# Patient Record
Sex: Female | Born: 1935 | Race: White | Hispanic: No | Smoking: Former smoker
Health system: Southern US, Community
[De-identification: ages and names within clinical notes are randomized; demographics above are authoritative.]

## PROBLEM LIST (undated history)

## (undated) DIAGNOSIS — N183 Chronic kidney disease, stage 3 unspecified: Secondary | ICD-10-CM

## (undated) DIAGNOSIS — I2510 Atherosclerotic heart disease of native coronary artery without angina pectoris: Secondary | ICD-10-CM

## (undated) DIAGNOSIS — E1122 Type 2 diabetes mellitus with diabetic chronic kidney disease: Secondary | ICD-10-CM

## (undated) DIAGNOSIS — I5030 Unspecified diastolic (congestive) heart failure: Secondary | ICD-10-CM

## (undated) DIAGNOSIS — J449 Chronic obstructive pulmonary disease, unspecified: Secondary | ICD-10-CM

## (undated) DIAGNOSIS — E669 Obesity, unspecified: Secondary | ICD-10-CM

## (undated) DIAGNOSIS — F419 Anxiety disorder, unspecified: Secondary | ICD-10-CM

## (undated) DIAGNOSIS — M1990 Unspecified osteoarthritis, unspecified site: Secondary | ICD-10-CM

## (undated) DIAGNOSIS — E119 Type 2 diabetes mellitus without complications: Secondary | ICD-10-CM

## (undated) DIAGNOSIS — C189 Malignant neoplasm of colon, unspecified: Secondary | ICD-10-CM

## (undated) DIAGNOSIS — D649 Anemia, unspecified: Secondary | ICD-10-CM

## (undated) DIAGNOSIS — J40 Bronchitis, not specified as acute or chronic: Secondary | ICD-10-CM

## (undated) HISTORY — PX: CATARACT EXTRACTION, BILATERAL: SHX1313

## (undated) HISTORY — PX: ABDOMINAL HYSTERECTOMY: SUR658

## (undated) HISTORY — PX: PARTIAL COLECTOMY: SHX5273

---

## 2004-06-08 ENCOUNTER — Other Ambulatory Visit: Payer: Self-pay

## 2004-06-08 ENCOUNTER — Inpatient Hospital Stay: Payer: Self-pay | Admitting: Internal Medicine

## 2004-06-24 ENCOUNTER — Ambulatory Visit: Payer: Self-pay | Admitting: Internal Medicine

## 2004-07-25 ENCOUNTER — Ambulatory Visit: Payer: Self-pay | Admitting: Internal Medicine

## 2004-08-25 ENCOUNTER — Ambulatory Visit: Payer: Self-pay | Admitting: Internal Medicine

## 2004-09-22 ENCOUNTER — Ambulatory Visit: Payer: Self-pay | Admitting: Internal Medicine

## 2006-10-27 ENCOUNTER — Ambulatory Visit: Payer: Self-pay | Admitting: Rheumatology

## 2006-11-01 ENCOUNTER — Inpatient Hospital Stay: Payer: Self-pay | Admitting: Internal Medicine

## 2006-11-01 ENCOUNTER — Ambulatory Visit: Payer: Self-pay | Admitting: Family Medicine

## 2006-11-01 ENCOUNTER — Other Ambulatory Visit: Payer: Self-pay

## 2006-11-02 ENCOUNTER — Other Ambulatory Visit: Payer: Self-pay

## 2007-09-11 ENCOUNTER — Ambulatory Visit: Payer: Self-pay | Admitting: Internal Medicine

## 2011-05-01 ENCOUNTER — Inpatient Hospital Stay: Payer: Self-pay | Admitting: Internal Medicine

## 2011-05-06 LAB — PATHOLOGY REPORT

## 2011-06-19 ENCOUNTER — Inpatient Hospital Stay: Payer: Self-pay | Admitting: Internal Medicine

## 2012-08-21 ENCOUNTER — Inpatient Hospital Stay: Payer: Self-pay | Admitting: Psychiatry

## 2012-08-21 LAB — COMPREHENSIVE METABOLIC PANEL
Albumin: 3 g/dL — ABNORMAL LOW (ref 3.4–5.0)
Bilirubin,Total: 0.4 mg/dL (ref 0.2–1.0)
Co2: 33 mmol/L — ABNORMAL HIGH (ref 21–32)
Creatinine: 1.31 mg/dL — ABNORMAL HIGH (ref 0.60–1.30)
EGFR (African American): 44 — ABNORMAL LOW
EGFR (Non-African Amer.): 38 — ABNORMAL LOW
Osmolality: 280 (ref 275–301)
Potassium: 4.5 mmol/L (ref 3.5–5.1)
Sodium: 138 mmol/L (ref 136–145)

## 2012-08-21 LAB — CBC
HCT: 47.1 % — ABNORMAL HIGH (ref 35.0–47.0)
MCV: 99 fL (ref 80–100)
RBC: 4.75 10*6/uL (ref 3.80–5.20)
RDW: 15 % — ABNORMAL HIGH (ref 11.5–14.5)

## 2012-08-21 LAB — TROPONIN I: Troponin-I: <0.02 ng/mL

## 2012-08-21 LAB — CK TOTAL AND CKMB (NOT AT ARMC): CK-MB: 1.4 ng/mL (ref 0.5–3.6)

## 2012-08-21 LAB — PROTIME-INR: Prothrombin Time: 13 secs (ref 11.5–14.7)

## 2012-08-21 LAB — PRO B NATRIURETIC PEPTIDE: B-Type Natriuretic Peptide: 450 pg/mL (ref 0–450)

## 2012-08-21 LAB — HEMOGLOBIN A1C: Hemoglobin A1C: 7.1 % — ABNORMAL HIGH (ref 4.2–6.3)

## 2012-08-22 LAB — BASIC METABOLIC PANEL
Anion Gap: 9 (ref 7–16)
Calcium, Total: 9.8 mg/dL (ref 8.5–10.1)
Co2: 28 mmol/L (ref 21–32)
Glucose: 171 mg/dL — ABNORMAL HIGH (ref 65–99)
Sodium: 133 mmol/L — ABNORMAL LOW (ref 136–145)

## 2012-08-22 LAB — CBC WITH DIFFERENTIAL/PLATELET
Basophil #: 0.1 10*3/uL (ref 0.0–0.1)
Eosinophil #: 0 10*3/uL (ref 0.0–0.7)
HGB: 13.8 g/dL (ref 12.0–16.0)
Lymphocyte #: 1 10*3/uL (ref 1.0–3.6)
Lymphocyte %: 6.9 %
MCH: 31.9 pg (ref 26.0–34.0)
MCHC: 32.3 g/dL (ref 32.0–36.0)
Monocyte #: 0.1 x10 3/mm — ABNORMAL LOW (ref 0.2–0.9)

## 2012-08-23 LAB — URINALYSIS, COMPLETE
Bacteria: NONE SEEN
Bilirubin,UR: NEGATIVE
Glucose,UR: 50 mg/dL (ref 0–75)
Ketone: NEGATIVE
Leukocyte Esterase: NEGATIVE
Nitrite: NEGATIVE
Ph: 6 (ref 4.5–8.0)
Specific Gravity: 1.011 (ref 1.003–1.030)
Squamous Epithelial: <1
WBC UR: NONE SEEN /HPF (ref 0–5)

## 2012-08-24 LAB — BASIC METABOLIC PANEL
BUN: 33 mg/dL — ABNORMAL HIGH (ref 7–18)
Chloride: 92 mmol/L — ABNORMAL LOW (ref 98–107)
Creatinine: 1.14 mg/dL (ref 0.60–1.30)
EGFR (African American): 52 — ABNORMAL LOW
EGFR (Non-African Amer.): 45 — ABNORMAL LOW
Osmolality: 277 (ref 275–301)
Potassium: 5.1 mmol/L (ref 3.5–5.1)
Sodium: 130 mmol/L — ABNORMAL LOW (ref 136–145)

## 2012-08-24 LAB — VANCOMYCIN, TROUGH: Vancomycin, Trough: 7 ug/mL — ABNORMAL LOW (ref 10–20)

## 2012-08-24 LAB — MAGNESIUM: Magnesium: 2.1 mg/dL

## 2012-08-24 LAB — URINE CULTURE

## 2012-08-25 LAB — CBC WITH DIFFERENTIAL/PLATELET
Basophil #: 0.1 10*3/uL (ref 0.0–0.1)
Eosinophil #: 0 10*3/uL (ref 0.0–0.7)
Eosinophil %: 0 %
HCT: 49.2 % — ABNORMAL HIGH (ref 35.0–47.0)
Lymphocyte #: 1.3 10*3/uL (ref 1.0–3.6)
MCH: 32 pg (ref 26.0–34.0)
Monocyte %: 4.2 %
Neutrophil #: 9.9 10*3/uL — ABNORMAL HIGH (ref 1.4–6.5)
Platelet: 207 10*3/uL (ref 150–440)
RBC: 4.96 10*6/uL (ref 3.80–5.20)
RDW: 15 % — ABNORMAL HIGH (ref 11.5–14.5)
WBC: 11.7 10*3/uL — ABNORMAL HIGH (ref 3.6–11.0)

## 2012-08-25 LAB — BASIC METABOLIC PANEL
Co2: 34 mmol/L — ABNORMAL HIGH (ref 21–32)
EGFR (African American): 46 — ABNORMAL LOW
Glucose: 163 mg/dL — ABNORMAL HIGH (ref 65–99)
Sodium: 134 mmol/L — ABNORMAL LOW (ref 136–145)

## 2012-08-26 LAB — BASIC METABOLIC PANEL
BUN: 28 mg/dL — ABNORMAL HIGH (ref 7–18)
Calcium, Total: 8.9 mg/dL (ref 8.5–10.1)
Chloride: 98 mmol/L (ref 98–107)
Creatinine: 0.93 mg/dL (ref 0.60–1.30)
EGFR (African American): >60
Glucose: 115 mg/dL — ABNORMAL HIGH (ref 65–99)
Potassium: 4.1 mmol/L (ref 3.5–5.1)
Sodium: 135 mmol/L — ABNORMAL LOW (ref 136–145)

## 2012-08-26 LAB — CBC WITH DIFFERENTIAL/PLATELET
HCT: 45.8 % (ref 35.0–47.0)
HGB: 14.8 g/dL (ref 12.0–16.0)
Lymphocyte %: 19.1 %
MCHC: 32.4 g/dL (ref 32.0–36.0)
MCV: 98 fL (ref 80–100)
Monocyte #: 1 x10 3/mm — ABNORMAL HIGH (ref 0.2–0.9)
Platelet: 183 10*3/uL (ref 150–440)
RBC: 4.66 10*6/uL (ref 3.80–5.20)

## 2012-08-27 LAB — CULTURE, BLOOD (SINGLE)

## 2012-08-27 LAB — BASIC METABOLIC PANEL
BUN: 31 mg/dL — ABNORMAL HIGH (ref 7–18)
Calcium, Total: 9.3 mg/dL (ref 8.5–10.1)
Co2: 30 mmol/L (ref 21–32)
EGFR (African American): 49 — ABNORMAL LOW
EGFR (Non-African Amer.): 42 — ABNORMAL LOW
Osmolality: 278 (ref 275–301)

## 2012-08-29 LAB — BASIC METABOLIC PANEL
Anion Gap: 6 — ABNORMAL LOW (ref 7–16)
BUN: 30 mg/dL — ABNORMAL HIGH (ref 7–18)
Calcium, Total: 9.5 mg/dL (ref 8.5–10.1)
Chloride: 95 mmol/L — ABNORMAL LOW (ref 98–107)
Co2: 33 mmol/L — ABNORMAL HIGH (ref 21–32)
Creatinine: 0.99 mg/dL (ref 0.60–1.30)
EGFR (African American): >60
Glucose: 90 mg/dL (ref 65–99)
Potassium: 4.1 mmol/L (ref 3.5–5.1)
Sodium: 134 mmol/L — ABNORMAL LOW (ref 136–145)

## 2012-09-04 ENCOUNTER — Inpatient Hospital Stay: Payer: Self-pay | Admitting: Internal Medicine

## 2012-09-04 LAB — COMPREHENSIVE METABOLIC PANEL
Albumin: 3.1 g/dL — ABNORMAL LOW (ref 3.4–5.0)
Anion Gap: 6 — ABNORMAL LOW (ref 7–16)
BUN: 21 mg/dL — ABNORMAL HIGH (ref 7–18)
Bilirubin,Total: 0.5 mg/dL (ref 0.2–1.0)
Creatinine: 1.33 mg/dL — ABNORMAL HIGH (ref 0.60–1.30)
Glucose: 159 mg/dL — ABNORMAL HIGH (ref 65–99)
Osmolality: 276 (ref 275–301)
Potassium: 4.6 mmol/L (ref 3.5–5.1)
SGPT (ALT): 41 U/L (ref 12–78)
Total Protein: 6.8 g/dL (ref 6.4–8.2)

## 2012-09-04 LAB — URINALYSIS, COMPLETE
Bilirubin,UR: NEGATIVE
Blood: NEGATIVE
Ketone: NEGATIVE
Leukocyte Esterase: NEGATIVE
Nitrite: NEGATIVE
Protein: 30
Squamous Epithelial: <1

## 2012-09-04 LAB — TROPONIN I: Troponin-I: <0.02 ng/mL

## 2012-09-04 LAB — CBC
HCT: 49.1 % — ABNORMAL HIGH (ref 35.0–47.0)
HGB: 16 g/dL (ref 12.0–16.0)
MCH: 32.3 pg (ref 26.0–34.0)
MCHC: 32.5 g/dL (ref 32.0–36.0)
MCV: 99 fL (ref 80–100)
Platelet: 196 10*3/uL (ref 150–440)
RBC: 4.94 10*6/uL (ref 3.80–5.20)

## 2012-09-04 LAB — CK TOTAL AND CKMB (NOT AT ARMC)
CK, Total: 42 U/L (ref 21–215)
CK-MB: 0.5 ng/mL (ref 0.5–3.6)

## 2012-09-05 LAB — COMPREHENSIVE METABOLIC PANEL
Albumin: 2.5 g/dL — ABNORMAL LOW (ref 3.4–5.0)
Alkaline Phosphatase: 58 U/L (ref 50–136)
Anion Gap: 7 (ref 7–16)
Calcium, Total: 8.8 mg/dL (ref 8.5–10.1)
Co2: 26 mmol/L (ref 21–32)
Creatinine: 1.15 mg/dL (ref 0.60–1.30)
Glucose: 165 mg/dL — ABNORMAL HIGH (ref 65–99)
Osmolality: 277 (ref 275–301)
Potassium: 4.5 mmol/L (ref 3.5–5.1)
SGPT (ALT): 30 U/L (ref 12–78)

## 2012-09-05 LAB — CBC WITH DIFFERENTIAL/PLATELET
Basophil #: 0 10*3/uL (ref 0.0–0.1)
Basophil %: 0.2 %
Eosinophil %: 0 %
HCT: 43.5 % (ref 35.0–47.0)
HGB: 14.3 g/dL (ref 12.0–16.0)
MCHC: 32.9 g/dL (ref 32.0–36.0)
MCV: 99 fL (ref 80–100)
Monocyte #: 0.3 x10 3/mm (ref 0.2–0.9)
Platelet: 167 10*3/uL (ref 150–440)
RDW: 15 % — ABNORMAL HIGH (ref 11.5–14.5)

## 2012-09-06 LAB — TROPONIN I
Troponin-I: <0.02 ng/mL
Troponin-I: <0.02 ng/mL
Troponin-I: <0.02 ng/mL

## 2012-09-07 LAB — BASIC METABOLIC PANEL
Anion Gap: 6 — ABNORMAL LOW (ref 7–16)
BUN: 14 mg/dL (ref 7–18)
Chloride: 103 mmol/L (ref 98–107)
Co2: 28 mmol/L (ref 21–32)
Creatinine: 1.26 mg/dL (ref 0.60–1.30)
EGFR (Non-African Amer.): 40 — ABNORMAL LOW
Glucose: 131 mg/dL — ABNORMAL HIGH (ref 65–99)
Potassium: 4.4 mmol/L (ref 3.5–5.1)
Sodium: 137 mmol/L (ref 136–145)

## 2012-09-07 LAB — CBC WITH DIFFERENTIAL/PLATELET
Basophil %: 0.6 %
Eosinophil #: 0.2 10*3/uL (ref 0.0–0.7)
Eosinophil #: 0.2 10*3/uL (ref 0.0–0.7)
Eosinophil %: 2.3 %
HCT: 44.3 % (ref 35.0–47.0)
HCT: 45.5 % (ref 35.0–47.0)
HGB: 14.9 g/dL (ref 12.0–16.0)
Lymphocyte #: 1.9 10*3/uL (ref 1.0–3.6)
Lymphocyte %: 23.5 %
MCH: 32.3 pg (ref 26.0–34.0)
MCHC: 32.8 g/dL (ref 32.0–36.0)
MCV: 100 fL (ref 80–100)
Monocyte #: 0.6 x10 3/mm (ref 0.2–0.9)
Monocyte %: 7.7 %
Monocyte %: 7.1 %
Neutrophil #: 5.7 10*3/uL (ref 1.4–6.5)
Neutrophil %: 66 %
Platelet: 154 10*3/uL (ref 150–440)
RBC: 4.45 10*6/uL (ref 3.80–5.20)
RDW: 15 % — ABNORMAL HIGH (ref 11.5–14.5)
RDW: 14.7 % — ABNORMAL HIGH (ref 11.5–14.5)
WBC: 8.9 10*3/uL (ref 3.6–11.0)
WBC: 8.4 10*3/uL (ref 3.6–11.0)

## 2012-09-07 LAB — HEPATIC FUNCTION PANEL A (ARMC)
Albumin: 2.6 g/dL — ABNORMAL LOW (ref 3.4–5.0)
Alkaline Phosphatase: 61 U/L (ref 50–136)
Bilirubin, Direct: <0.1 mg/dL (ref 0.00–0.20)
Total Protein: 5.6 g/dL — ABNORMAL LOW (ref 6.4–8.2)

## 2012-09-07 LAB — PROTIME-INR
INR: 0.9
Prothrombin Time: 12.2 secs (ref 11.5–14.7)

## 2012-09-07 LAB — APTT: Activated PTT: 25.2 secs (ref 23.6–35.9)

## 2012-09-07 LAB — URINE CULTURE

## 2012-09-08 LAB — TROPONIN I: Troponin-I: <0.02 ng/mL

## 2012-09-09 LAB — HEPATIC FUNCTION PANEL A (ARMC)
Bilirubin, Direct: <0.1 mg/dL (ref 0.00–0.20)
Bilirubin,Total: 0.4 mg/dL (ref 0.2–1.0)
SGOT(AST): 22 U/L (ref 15–37)
SGPT (ALT): 34 U/L (ref 12–78)
Total Protein: 5.5 g/dL — ABNORMAL LOW (ref 6.4–8.2)

## 2012-09-09 LAB — TROPONIN I: Troponin-I: <0.02 ng/mL

## 2012-09-09 LAB — CK-MB: CK-MB: <0.5 ng/mL — ABNORMAL LOW (ref 0.5–3.6)

## 2012-09-09 LAB — LIPASE, BLOOD: Lipase: 367 U/L (ref 73–393)

## 2012-09-10 LAB — CULTURE, BLOOD (SINGLE)

## 2012-09-11 LAB — BASIC METABOLIC PANEL
Anion Gap: 3 — ABNORMAL LOW (ref 7–16)
BUN: 21 mg/dL — ABNORMAL HIGH (ref 7–18)
Co2: 32 mmol/L (ref 21–32)
Creatinine: 1.23 mg/dL (ref 0.60–1.30)
Glucose: 168 mg/dL — ABNORMAL HIGH (ref 65–99)
Osmolality: 271 (ref 275–301)
Potassium: 5.2 mmol/L — ABNORMAL HIGH (ref 3.5–5.1)

## 2012-09-11 LAB — CBC
HGB: 14.1 g/dL (ref 12.0–16.0)
MCH: 32.1 pg (ref 26.0–34.0)
MCHC: 32.7 g/dL (ref 32.0–36.0)
MCV: 98 fL (ref 80–100)
RBC: 4.41 10*6/uL (ref 3.80–5.20)
RDW: 14.6 % — ABNORMAL HIGH (ref 11.5–14.5)
WBC: 11 10*3/uL (ref 3.6–11.0)

## 2012-09-11 LAB — HEPATIC FUNCTION PANEL A (ARMC)
Albumin: 2.8 g/dL — ABNORMAL LOW (ref 3.4–5.0)
Alkaline Phosphatase: 57 U/L (ref 50–136)
Bilirubin, Direct: <0.1 mg/dL (ref 0.00–0.20)
SGOT(AST): 28 U/L (ref 15–37)
SGPT (ALT): 55 U/L (ref 12–78)

## 2012-09-11 LAB — LIPASE, BLOOD: Lipase: 283 U/L (ref 73–393)

## 2012-09-12 LAB — CBC WITH DIFFERENTIAL/PLATELET
Basophil #: 0 10*3/uL (ref 0.0–0.1)
Basophil %: 0.1 %
Eosinophil #: 0 10*3/uL (ref 0.0–0.7)
Lymphocyte #: 0.7 10*3/uL — ABNORMAL LOW (ref 1.0–3.6)
MCHC: 33.4 g/dL (ref 32.0–36.0)
MCV: 98 fL (ref 80–100)
Monocyte %: 3.2 %
Neutrophil #: 8.8 10*3/uL — ABNORMAL HIGH (ref 1.4–6.5)
Neutrophil %: 89.3 %
RBC: 4.05 10*6/uL (ref 3.80–5.20)
WBC: 9.9 10*3/uL (ref 3.6–11.0)

## 2012-09-13 LAB — BASIC METABOLIC PANEL
Anion Gap: 9 (ref 7–16)
Calcium, Total: 9.7 mg/dL (ref 8.5–10.1)
Chloride: 93 mmol/L — ABNORMAL LOW (ref 98–107)
Creatinine: 1.2 mg/dL (ref 0.60–1.30)

## 2012-09-13 LAB — HEMATOCRIT: HCT: 43.2 % (ref 35.0–47.0)

## 2012-09-13 LAB — HEMOGLOBIN: HGB: 13.5 g/dL (ref 12.0–16.0)

## 2014-04-08 ENCOUNTER — Emergency Department: Payer: Self-pay | Admitting: Emergency Medicine

## 2014-11-14 NOTE — Consult Note (Signed)
CC: abd pain.  Pt with abd pain across upper abd and in substernal area.  Pt had cath today with signif PVD.  She had an [place] of abd showing fatty liver with hepatomegaly.  This likely explains her discomfort across her upper abdomen.  Agree with taking PPI after discharge from hospital. LFT's ok except albumin at 2.8.  Electronic Signatures: Manya Silvas (MD)  (Signed on 18-Feb-14 17:20)  Authored  Last Updated: 18-Feb-14 17:20 by Manya Silvas (MD)

## 2014-11-14 NOTE — Consult Note (Signed)
PATIENT NAME:  Tina [name], Tina [name] MR#:  [id number] DATE OF BIRTH:  December 26, 1927  DATE OF CONSULTATION:  09/09/2012  REFERRING PHYSICIAN:   CONSULTING PHYSICIAN:  Dionisio David, MD  HISTORY OF PRESENT ILLNESS: This is an 81 year old white female with a past medical history of COPD, coronary artery disease, hypertension, hyperlipidemia, recently diagnosed diabetes, who came into the hospital with pneumonia and was treated with antibiotic Levaquin. She was about to be discharged, but she complained of chest pain, described as heaviness in the chest, and thus I was asked to evaluate the patient. She still feels tightness in the chest.   PAST MEDICAL HISTORY:  Coronary artery disease, history of MI in 2000 status post stents, hypertension, hypothyroidism, hyperlipidemia, peptic ulcer disease, chronic anemia, depression, insulin-dependent diabetes.   ALLERGIES: PENICILLIN, BIAXIN, CODEINE.   SOCIAL HISTORY: She quit smoking several years ago.  She denies EtOH abuse.   FAMILY HISTORY: Positive for leukemia and cancer, but not coronary artery disease.   PHYSICAL EXAMINATION: GENERAL: She is alert, oriented x3, in no acute distress right now.  VITALS:  Stable.  NECK: No JVD.  LUNGS: Clear.  HEART: Regular rate and rhythm. Normal S1, S2. No audible murmur.  ABDOMEN: Soft, nontender, positive bowel sounds.  EXTREMITIES: No pedal edema.  NEUROLOGIC: She appears to be intact.   LABORATORY, DIAGNOSTIC AND RADIOLOGICAL DATA: EKG shows normal sinus rhythm, right bundle branch block, nonspecific ST-T changes.   Her initial troponin is negative. Her white count is 8.4, hemoglobin is 14.9.   ASSESSMENT AND PLAN: Anginal-type chest pain. Chest x-ray actually just had chronic obstructive pulmonary disease, no infiltrate. Question is if all these symptoms are due to coronary artery disease; thus, I was asked to evaluate the patient. I think it is reasonable to discharge the patient, and we will follow the patient  in the office. The patient can be discharged tomorrow on Imdur 30 mg once a day along with aspirin and metoprolol, and we will do outpatient stress testing.   Thank you very much for the referral.      ____________________________ Dionisio David, MD sak:cc D: 09/09/2012 17:37:52 ET T: 09/09/2012 17:45:15 ET JOB#: [id number]  cc: Dionisio David, MD, <Dictator> Dionisio David MD ELECTRONICALLY SIGNED 09/10/2012 9:04

## 2014-11-14 NOTE — Discharge Summary (Signed)
PATIENT NAME:  Tina [name], Tina [name] MR#:  [id number] DATE OF BIRTH:  1928-01-19  DATE OF ADMISSION:  09/04/2012 DATE OF DISCHARGE:  09/13/2012   ADMITTING DIAGNOSES:  1.  Shortness of breath, possibly due to chronic obstructive pulmonary disease flare as well as bronchitis. Her chest x-ray failed to reveal pneumonia.  2.  Leukocytosis on presentation, could have been related to possible steroids.  3.  Possible systemic inflammatory response syndrome, although there was no evidence of pneumonia on chest x-ray. Her cultures have been negative.  4.  Right upper quadrant bilateral chest pressure, status post evaluation with a cardiac catheterization. Had a CT scan of the abdomen as well as right upper quadrant ultrasound. Etiology of this was unclear pt was tried on stool softners, simithicone. Was seen by cardiology and GI. Her D-dimer was negative. The patient's family refused CT scan of the chest due to her having severe allergy to contrast.  5.  Chronic kidney disease stage III.  6.  Chronic obstructive pulmonary disease.  7.  Morbid obesity.  8.  Sleep apnea.  9.  Hypertension.  10.  Hypothyroidism.  11.  Diabetes.  12.  Coronary artery disease, status post cardiac catheterization during this hospitalization.  13.  Hyponatremia felt to be due to some dehydration.  14.  Left-sided iliac stenosis.   CONSULTANTS DURING HOSPITALIZATION: Dr. Vira Agar, GI. Dr. Neoma Laming of cardiology.  PERTINENT LABORATORIES AND EVALUATIONS: Admitting glucose 159, BUN 21, creatinine 1.33, sodium 135, potassium 4.6, chloride 101, CO2 of 28, calcium 9.3. Lipase level was 382. LFTs: Total protein 6.8, albumin 3.1, bilirubin total 0.5, alkaline phosphatase 74, ALT 41. Troponin less than 0.02. WBC initially was 18.5, hemoglobin 16, platelet count 196. Subsequent WBC count on February 14 was 11.4 and then February 13 was normal at 9.6. Blood cultures showed coag-negative staph consistent with contamination with skin flora.  Second blood culture was negative. Urinalysis: Nitrites negative, leukocyte esterase negative. EKG showed sinus rhythm with PACs. Her cardiac catheterization showed global left ventricular function was normal, EF 60%. She had 90% stenosis at the bifurcation with the left iliac in the distal aorta. Left main was normal. Left circumflex 30% restenosis. LAD was nondominant. RCA: No significant disease. LVEF was 60%. Most recent blood work on February 20: Her BUN is 33, creatinine 1.20, sodium 131, potassium 5.0, chloride 93. Hemoglobin 13.5. D-dimer was 0.28. PA and lateral chest x-ray on presentation showed stable chest with stable cardiomegaly, no evidence of infiltrate. Ultrasound of the lower extremities showed no evidence of DVT. V/Q scan showed matched ventilation perfusion defect in the upper and lower left lobe of the lung, normal appearing right lung, cardiomegaly. Given the large number of defects, the patient is intermediate risk for PE. However, they were matched defects. CT of the abdomen and pelvis without contrast showed innumerable phleboliths, nonobstructing ureteral stone. Ultrasound of the right upper quadrant showed hepatomegaly with fatty liver, no other abnormalities.   HOSPITAL COURSE: Please refer to H and P done by the admitting physician. The patient is an 81 year old white female who presented with complaint of worsening symptoms of pneumonia. The patient was recently hospitalized and in the hospital had to be placed on BiPAP due to significant respiratory distress. Her white blood cell count was 14,000 during the hospitalization. The patient had finished her antibiotics prior to coming back to the hospital. The patient just came in with complaint of not feeling well. In the ED, she was noted to have an elevated WBC count; however,  she was on recent steroids. She had a temperature of 99.6. Her chest x-ray was negative. Due to her symptoms, we were asked to admit the patient.   The patient  continued to complain of dyspnea. Her chest x-ray was negative. She was thought to have possible COPD exacerbation and was treated with nebulizers and IV steroids. She continued to complain of shortness of breath; therefore, a V/Q scan was ordered which was intermediate due to matched defects in the left lung. We discussed the possibility of a CT scan of the chest; however, family did not want her to get that since the patient had a severe allergic anaphylactic reaction to the contrast. Her Doppler of the lower extremity was negative and her D-dimer was normal, making likelihood of a pulmonary embolus being very low. Also, the patient has had a history of bleeding in the past and the family did not want her to get anticoagulation unless absolutely needed. In terms of her respiratory status, the patient is requiring Lasix intermediately, but today she is weaned down to 2 liters.   She also started complaining of pain below her breast, sometimes pointing to her chest, sometimes pointing to her upper abdomen. Therefore, she had a CT scan of the abdomen which was nonrevealing. Also, a cardiology consult was obtained. Her cardiac enzymes and EKG remained negative. Due to her persistent symptoms of pressure below her breast in her chest, she had a cardiac catheterization done which was negative for any obstructive disease. She also had a right upper quadrant ultrasound which did not show any gallbladder disease. She continues to complain of these symptoms, which are very vague. She has been tried on stool softeners as well as gas relieving medications, and she has been thoroughly worked up for this.   At this point, the patient has generalized weakness and deconditioning. Family has requested that she be transferred to a rehab, which is currently being arranged.   DISCHARGE MEDICATIONS: Advair 250/50 one inhalation b.i.d., Artificial Tears 1 drop to each eye b.i.d., levothyroxine 112 mcg daily, lisinopril 5 daily,  metoprolol tartrate 25 mg 1 tab p.o. b.i.d, omeprazole 40 mg 2 times per day, milk of magnesia 30 mL once a day at bedtime,  ProAir 90 mcg 1 puff 4 times per day as needed for shortness of breath, Iophen-NR 10 mL  orally q.6 hours as needed for cough, aspirin 81 mg 1 tab p.o. daily, docusate 100 mg 1 tab p.o. b.i.d., Minerin cream apply topically to both lower extremities and feet once a day, Novolin R sliding scale (please refer to the discharge summary), MiraLax 17 grams daily, senna 1 tab p.o. b.i.d., vitamin B12 at 1000 mcg daily, vitamin D2 at 50,000 international units on Tuesday, loperamide 2 mg p.r.n. diarrhea, albuterol/Atrovent nebulizers q.6 p.r.n., Maalox 30 mL q.4 p.r.n. for indigestion/heartburn, Tylenol 500 q.4 p.r.n., Lantus 14 units subcutaneous at bedtime, guaifenesin 600 one tab p.o. b.i.d, sucralfate 1 gram p.o. 4 times a day, simethicone 80 mg 1 tab p.o. q.8, Percocet 5/325 q.6 p.r.n. for pain, oxygen delivery at 2 liters nasal cannula.  DIET: Low fat, low sodium, carbohydrate consistent diet.   ACTIVITY: As tolerated with PT, OT evaluation and treatment.   FOLLOWUP: With primary MD, which is [facility], so they need to see her at the skilled nursing facility. Follow up with Dr. Delana Meyer for her left-sided iliac stenosis in 4 to 6 week.   TIME SPENT: 45 minutes.    ____________________________ Lafonda Mosses Posey Pronto, MD shp:jm  D: 09/13/2012 14:48:30 ET T: 09/13/2012 15:18:43 ET JOB#: [id number]  cc: Myalynn Lingle H. Posey Pronto, MD, <Dictator> Alric Seton MD ELECTRONICALLY SIGNED 09/15/2012 12:12

## 2014-11-14 NOTE — Consult Note (Signed)
PATIENT NAME:  Tina [name], Tina [name] MR#:  [id number] DATE OF BIRTH:  1928/06/27  DATE OF CONSULTATION:  09/10/2012  REFERRING PHYSICIAN:  Dr. Posey Pronto. CONSULTING PHYSICIAN:  Manya Silvas, MD/Chany Woolworth A. Jerelene Redden, ANP  REASON FOR CONSULTATION: Abdominal pain.   HISTORY OF PRESENT ILLNESS: This is an 81 year old patient with history of COPD, pneumonia, diabetes, CAD, hyperkalemia, dehydration, who was hospitalized 08/21/2012 through 08/29/2012 for COPD exacerbation, treated with antibiotics, steroid therapy, BiPAP. She did well and was discharged to her assisted living facility. The patient was readmitted 09/04/2012 with worsening symptoms of pneumonia. She has been hospitalized this week and has undergone therapy for COPD, renal insufficiency, hyponatremia, improved, leukocytosis, improved with Levaquin, chest pain with negative cardiac enzymes, V/Q scan indeterminant, d-dimer negative, lower extremity Doppler negative, CT of the abdomen negative, and cardiology consult with recommendation for outpatient stress test. The patient complained today of a sensation that that there is an elephant sitting on her chest, and points to the lower midsternal epigastric area. She says this has been a continuous feeling today and it could be related to gas. GI has been asked to see the patient regarding further evaluation and management.   This patient has a history of IDA and colon cancer treated by surgery in 2006. She has had a recent colonoscopy 05/04/2011 for rectal bleeding, iron deficiency anemia, and it was notable for diverticulosis in the sigmoid and descending colon. Exam otherwise normal. An upper endoscopy performed 05/04/2011 for dyspepsia, iron deficiency anemia, performed by Dr. Verdie Shire, and it was totally normal. The patient has been on omeprazole twice daily continuous, and she denies NSAID use. She reports her diet and appetite have been normal, and her bowel habits have been normal. She has noted no nausea or  vomiting, heartburn, reflux or dysphagia. The patient has received antibiotic therapy over the last 2 months. She does have some baseline shortness of breath that is chronic, says she is not coughing much now. She has noted no exertional chest pain, but she says she is very sedentary and spends most of her day in bed. The patient reports her respiratory status has improved overall.   PAST MEDICAL HISTORY:  1. Coronary artery disease.  2. MI post stents.  3. Hypertension.  4. Hypothyroidism.  5. Hyperlipidemia.  6. Peptic ulcer disease.  7. Chronic anemia.  8. Depression.  9. Colon cancer, partial colon resection in 2006.  10. COPD.  11. Insulin-dependent diabetes.  12. Chronic kidney disease, stage III.  13. Sleep apnea.  14. Osteoarthritis.  15. Morbid obesity.   PAST SURGICAL HISTORY: Thyroidectomy, hysterectomy, cataract surgery, colon resection, thyroid surgery, parathyroid surgery.   MEDICATIONS:  1. Vitamin D 50,000 units daily.  2. B12 1000 mcg daily.  3. Senna for constipation. 4. ProAir HFA 90 mcg 4 times daily as needed.   5. MiraLax once daily.  6. Oyster calcium twice daily.  7. Omeprazole 20 mg twice daily. 8. Novolin insulin sliding scale.  9. Milk of Magnesia p.r.n.  10. Metoprolol 25 mg twice daily.  11. Acetaminophen 500 every 4 hours.  12. Maalox as needed for indigestion.  13. Loperamide as needed for diarrhea.  14. Lisinopril 5 mg daily.  15. Levothyroxine 112 mcg daily.  16. Lantus 5 units every night.  17. Iophen 10 mL every 6 hours.  18. Aspirin 81 mg daily.  19. Artificial tears in her eyes.  20. Advair Diskus 250/50 twice daily.   ALLERGIES: PENICILLIN, BIAXIN, CODEINE, NOVOCAIN, IODINE.   HABITS:  Previous tobacco, quit 30 years ago, negative alcohol. The patient stays at [facility], which is assisted living facility.   FAMILY HISTORY: Multiple myeloma, leukemia in mom, positive cancers on the mother's side.   REVIEW OF SYSTEMS: As noted  in history of present illness. Denies burping or belching, excessive flatus or diarrhea. Reports she is tolerating a regular diet well. Remaining 10 systems negative with the exception as noted in history of present illness.   PHYSICAL EXAMINATION:  VITAL SIGNS: Temperature 97.6, pulse 74, respiratory rate 18, blood pressure 109/57.  GENERAL: Elderly Caucasian female resting in bed, NAD. The patient is obese.  HEENT: Shows thinning hair. Conjunctivae a little injected. Sclerae anicteric. Oral mucosa is moist and intact.  NECK: Supple. Trachea is midline.  HEART TONES: S1 and S2, without murmur.  LUNGS: Essentially CTA with just a few scattered bibasilar crackles.  ABDOMEN: Soft, a little tender in epigastric area. Cannot appreciate hepatosplenomegaly. Bowel sounds are present.  RECTAL: Deferred.  SKIN: Warm and dry without rash.  EXTREMITIES: Without cyanosis, clubbing or edema.  MUSCULOSKELETAL: No significant joint abnormalities.  PSYCHIATRIC: Affect and mood within normal. She is alert and oriented.   LABORATORY: Glucoses have been checked daily. She was 230 today. Her most recent liver panel was 09/09/2012 with albumin 2.7, total protein 5.5, otherwise normal. CBC performed 09/07/2012 with a hemoglobin 14.9, WBC 8.4. Laboratory studies 09/09/2012 with lipase 367, albumin 2.7. Liver panel otherwise normal.   RADIOLOGY: CT of the abdomen and pelvis without contrast 09/07/2012 showed cardiomegaly. Liver normal. Gallbladder, spleen pancreas, adrenals normal. There is bilateral nephrolithiasis. Mild ectasia of the abdominal aorta 2.4 cm. No bowel obstruction or inflammatory changes noted. There is diverticulosis. Innumerable  phleboliths. Nonobstructing ureteral stones on either side cannot be excluded. Chest PA and lateral dated 09/07/2012 with lungs hyperinflated, consistent with COPD, cardiomegaly. V/Q scan performed 09/07/2012 showed matched ventilation perfusion defects in the upper and lower  left lobes of the lung, normal-appearing right lung. Given large number of defects, this patient is indeterminant risk for pulmonary embolism.   IMPRESSION: An 81 year old with a significant history of pulmonary disease and coronary artery disease, prior history of colon cancer in 2006, who was readmitted for worsening symptoms of pneumonia. She has noted today a sensation that she describes as an "elephant sitting on her chest." She points to the lower sternum and upper abdomen region. She reports a little tenderness with exam of epigastric area. Her cardiologist has seen her and notes a negative  D-dimer, negative cardiac enzymes, no acute EKG changes, and plans for an outpatient stress test. Regarding her pulmonary status, chest x-ray did not show pneumonia, blood cultures 1 out of 4, likely contamination. She has been treated with antibiotic therapy. Has had problems with hypoxia and dyspnea on exertion. V/Q with intermediate findings. She is afebrile. From a gastrointestinal standpoint, she denies heartburn, reflux, nausea or vomiting, tolerating her diet well, and reports normal bowel movements. She has had illnesses and is certainly at risk for gastritis, possibility of small ulcer could not be ignored. However, this patient is not a luminal candidate until after her cardiac stress test is completed and we have cardiac clearance. In the face of normal EGD and colonoscopy procedures in 2012, would not recommend urgent EGD at this time.   PLAN:  1. Treat patient with Carafate 1 gram b.i.d.  2. Mylanta as needed for gas relief.  3. Abdominal ultrasound. 4. No recommendation for EGD until she has cardiac workup completed.  5. CT study was unremarkable.  6. Her complaints of "elephant sitting on my chest" are certainly concerning from a cardiac description standpoint.   This case was discussed with Dr. Vira Agar in collaboration of care. Thank you for the consultation. These services provided by Denice Paradise, ANP.     ____________________________ Janalyn Harder. Jerelene Redden, ANP kam:OSi D: 09/10/2012 18:11:24 ET T: 09/11/2012 06:30:25 ET JOB#: [id number]  cc: Joelene Millin A. Jerelene Redden, ANP, <Dictator> Janalyn Harder. Sherlyn Hay, MSN, ANP-BC Adult Nurse Practitioner ELECTRONICALLY SIGNED 09/13/2012 8:50

## 2014-11-14 NOTE — H&P (Signed)
PATIENT NAME:  Tina [name], [name] MR#:  [id number] DATE OF BIRTH:  [birth year]  DATE OF ADMISSION:  09/04/2012  REFERRING PHYSICIAN:  Graciella Freer.   REASON FOR ADMISSION: Worsening symptoms of pneumonia. The patient recently discharged on February 5th.   PRIMARY CARE PHYSICIAN:  Unknown.  HISTORY OF PRESENT ILLNESS:   The patient is a very nice 81 year old female, who has history of being hospitalized recently here in the end of January, discharged on February 5th. She has history of COPD, CAD, hypertension, hyperlipidemia. She came on January 28th with a history of chronic pulmonary disease exacerbation, likely pneumonia. She was treated with Levaquin, inhalers and steroids, and she was starting to improve. At that moment, she was placed on BiPAP due to significant respiratory distress on admission. She had a white count of 14,000, and during the hospitalization, her white count remained around 11,000, despite the steroids. She has completed her steroids, and her white count today is 18,000. She is febrile and she is having significant respiratory distress requiring oxygen. For that reason, she is readmitted. Please refer to her previous H and P done on January 28th for other details. At this moment, the patient just does not feel very good. She went home on Monday, and today she comes with increased fever, increased shortness of breath, increased cough and increased secretions. The patient is admitted to the hospital service.   REVIEW OF SYSTEMS:   CONSTITUTIONAL: She denies any fatigue, weakness. Positive fever. Negative weight loss, weight gain.  EYES: Negative blurry vision or difficulty with her vision in general. EAR, NOSE, THROAT:  No tinnitus. No difficulty swallowing.  RESPIRATORY: Positive cough. Positive dyspnea. Negative hemoptysis. Negative painful respirations. Positive occasional wheezing, but she states that she has not been wheezing during the last couple of days. History of multiple  pneumonias in the past.  CARDIOVASCULAR: Negative chest pain, orthopnea, arrhythmias. GASTROINTESTINAL: No nausea or vomiting. No diarrhea. No abdominal pain. No melena or hematochezia.  GENITOURINARY: No dysuria, hematuria or changes in frequency.  ENDOCRINE: No polyuria, polydipsia or polyphagia. No cold or heat intolerance. HEMATOLOGIC/LYMPHATIC: No easy bleeding, anemia or easy bruising. No swollen glands.  SKIN: No rashes. No lesions.  MUSCULOSKELETAL: No swelling in joints. No significant arthritis. No gout.  NEUROLOGIC: No numbness, tingling. No ataxia or history of strokes.  PSYCHIATRIC: No anxiety, depression or insomnia.   PAST MEDICAL HISTORY: 1.  Coronary artery disease.  2.  History of MI in 2000, status post stents.  3.  Hypertension.  4.  Hypothyroidism.  5.  Hyperlipidemia.  6.  Peptic ulcer disease.  7.  Chronic anemia.  8.  Depression.  9.  Colon cancer status post partial colon resection.  10.  COPD.  11.  Insulin-dependent diabetes.  12.  Chronic kidney disease, stage III.  13.  Sleep apnea.  14.  Osteoarthritis.  15.  Obesity.   ALLERGIES: PENICILLIN, BIAXIN, CODEINE, NOVOCAINE AND IODINE. HER PENICILLIN ALLERGY WAS NUMBING OF THE FACE AND MOUTH.   PAST SURGICAL HISTORY:   1.  Positive for stent placement in 2000, and partial colon resection.  2.  She also had hysterectomy.  3.  Bladder surgery.  4.  Thyroidectomy.   SOCIAL HISTORY: The patient used to smoke. She smoked for over 40 years, 1 pack a day, but she quit about 30 years ago. No alcohol, no illicit drugs. She is staying at [facility], which is an assisted living facility.   FAMILY HISTORY: Positive leukemia in her mom. Positive cancers  that she cannot tell me exactly what they are, but they are on her mother's family.   CURRENT MEDICATIONS: Include vitamin D 50,000 units once daily, vitamin B12, 1000 mg once daily; senna for constipation, ProAir HFA 90 mcg 4 times a day as needed, MiraLAX once  daily, oyster calcium twice daily, omeprazole 20 mg twice daily, Novolin insulin sliding scale, milk of magnesia p.r.n., metoprolol 25 mg twice daily, acetaminophen 500 mg every 4 hours, Maalox as needed for indigestion, loperamide as needed for diarrhea, lisinopril 5 mg once daily, levothyroxine 112 mcg once daily, Lantus 5 units every night, Iophen 10 mL every 6 hours, aspirin 81 mg once daily, Artificial Tears in her eyes, and Advair Diskus 250/5 twice daily.   PHYSICAL EXAMINATION: VITAL SIGNS: Blood pressure 140/60, pulse 94, respiratory rate 20, temperature 99.6, oxygen saturation down to 88% on room air. Right now on 2 liters of oxygen, she is around 92%.  GENERAL: The patient is alert, oriented x 3. No acute distress. No respiratory distress. She is looking slightly dehydrated, but hemodynamically stable.  HEENT: Her pupils are equal and reactive. Extraocular movements are intact. Mucosae are slightly dry. No oral lesions. No oropharyngeal exudates. No thrush.  NECK: Supple. No JVD. No thyromegaly. No adenopathy. No carotid bruits. No rigidity.  CARDIOVASCULAR: Regular rate and rhythm. No murmurs, rubs or gallops. No displacement of PMI. No tenderness to palpation of anterior chest wall.  LUNGS: Clear without any wheezing or crepitus for the most part on the upper respiratory fields, but on a left base I can hear some rales. I could not hear any crackles. There is no dullness to percussion. No use of accessory muscles.  ABDOMEN: Soft, nontender, nondistended. No hepatosplenomegaly. No masses. Bowel sounds are positive.  EXTREMITIES: No cyanosis, no clubbing.   No edema. LYMPHATIC: Negative for lymphadenopathy.  SKIN: Without any rashes or petechiae.  MUSCULOSKELETAL: No significant joint abnormalities or effusions.  PSYCHIATRIC: Mood is normal without any signs of anxiety or agitation.  NEUROLOGIC: Cranial nerves II through XII intact. No focal findings.   LABORATORY, DIAGNOSTIC AND  RADIOLOGICAL DATA: Chest x-ray dictated by radiologist as no major changes from previous. I see a left blunting of the diaphragm, likely due to forming infiltrate, and there are no signs of CHF that I can tell. Her blood work shows a BUN of 21, creatinine of 1.33. At discharge, her creatinine was 0.99. Her sodium is 135, potassium is 4.6. Her albumin is 3.1. White cells are 18,000, at discharge last time were 12,000. Her platelets are 196, and her hemoglobin is 16. UA shows 2 white blood cells, the rest is negative.   ASSESSMENT AND PLAN: An 81 year old female with history of chronic obstructive, recently admission for chronic obstructive pulmonary disease exacerbation. Today, she comes with worsening of cough, now she has a fever, tachycardic and systemic inflammatory response syndrome. She is requiring at least 2 liters of oxygen. She is not on oxygen at home.  1.  Chronic obstructive pulmonary disease exacerbation with possible pneumonia. The patient is admitted at this moment. She was treated with Levaquin. She has been recently hospitalized, and she looks worse today. I am going to add on treatment for hospital-acquired pneumonia. She is going to get meropenem, AS THE PATIENT IS ALLERGIC TO PENICILLIN, and Levaquin. The patient was treated with Levaquin previously for a while. If she is not getting better, consider changing her antibiotics.   Plus/minus vancomycin at this moment. She does not look that septic, but  considering that she was recently hospitalized, and she had a gram-positive cocci on her blood cultures before, I am going to cover it with that. Her gram-positive cocci on previous blood cultures ended up being coagulase negative.   I am not going to give her steroids at this time, as the patient is not wheezing. Consider adding on steroids if her respiratory problems are not improving.   Blood cultures have been taken, and sputum cultures have been taken.   2.  Pneumonia. As mentioned  above, the patient on Levaquin, meropenem and vancomycin.  3.  Leukocytosis, likely due to pneumonia. She was recently on steroids. She is no longer taking them. Her white count and high dose of steroids did not go too high, it was so only 15,000. It dropped, and now is high again, likely due systemic inflammatory response syndrome.  4.  Systemic inflammatory response syndrome. Evidence of tachycardia, tachypnea, respiratory failure requiring 2 liters of oxygen with O2 sats only 88%. The patient has elevated white blood count and fever.  5.  Chronic kidney disease, stage III. Actually, her creatinine was better than it is now at this moment. We are going to give her gentle hydration to prevent this from getting worse.  6.  Hypertension. We are going to hold on lisinopril, as the patient had increase in her creatinine from her baseline.  7.  Hypothyroidism, status post thyroidectomy. Continue Synthroid.  8.  Diabetes. The patient has stable diabetes. We are going to continue Lantus.  9.  Coronary artery disease. Continue aspirin. Continue beta blocker. Hold on ACE inhibitor due to the fact that the patient has increased creatinine.  10.  PATIENT IS A FULL CODE.  11.  Deep vein thrombosis prophylaxis with heparin.   Time Spent with this patient today about 55 minutes.     ____________________________ Steele Sink, MD rsg:dm D: 09/04/2012 18:21:00 ET T: 09/04/2012 19:42:57 ET JOB#: [id number]  cc: Hiddenite Sink, MD, <Dictator> Takumi Din America Brown MD ELECTRONICALLY SIGNED 09/15/2012 22:46

## 2014-11-14 NOTE — H&P (Signed)
PATIENT NAME:  Tina [name], Tina [name] MR#:  [id number] DATE OF BIRTH:  [birth year]  DATE OF ADMISSION:  08/21/2012  PRIMARY CARE PHYSICIAN: Unknown.   CHIEF COMPLAINT: Shortness of breath.   HISTORY OF PRESENT ILLNESS: This is an 81 year old female who presents to the Emergency Room from assisted living at [facility] secondary to shortness of breath, cough and productive sputum. The patient says she developed those symptoms about 2 to 3 days ago and has been progressively getting worse. She says that the coughing paroxysm have kept her up at night. She also complains of chills, but no documented fever.  She generally is not very active and, therefore, gets in and out of wheelchair, but she has been having even baseline dyspnea without any exertion. She was therefore brought to the ER for further evaluation. In the Emergency Room, the patient was noted to be hypoxic with O2 sats in the mid-80s. She was noted to be in hypercarbic hypoxic respiratory failure secondary to COPD and therefore emergently placed on BiPAP. Hospital services were contacted for further treatment and evaluation.   REVIEW OF SYSTEMS:  CONSTITUTIONAL:  No documented fever. No weight gain.  No weight loss.  EYES:  No blurry or double vision.  ENT:  No tinnitus. No postnasal drip. No redness of the oropharynx.  RESPIRATORY:  Positive cough. No wheeze. No hemoptysis. Positive dyspnea.  CARDIOVASCULAR:  No chest pain. No orthopnea. No palpitations or syncope.  GASTROINTESTINAL:  No nausea. No vomiting. No diarrhea. No abdominal pain. No melena or hematochezia.  GENITOURINARY:  No dysuria or hematuria.  ENDOCRINE:  No polyuria or nocturia. No heat or cold intolerance.  HEMATOLOGY:  No anemia. No bruising. No bleeding.  INTEGUMENT:  No rashes. No lesions.  MUSCULOSKELETAL:  No arthritis. No swelling. No gout.  NEUROLOGIC:  No numbness or tingling. No ataxia. No seizure-type activity.  PSYCHIATRIC:  No anxiety. No insomnia. No ADD.    PAST MEDICAL HISTORY: Consistent with a history of coronary artery disease, status post MI, a stent in 2000. History of hypertension, hyperlipidemia, hypothyroidism, peptic ulcer disease, depression, chronic anemia, history of colon cancer, status post partial resection with chemotherapy, COPD. Diabetes. Chronic renal insufficiency, stage III. Obesity, sleep apnea, osteoarthritis.   ALLERGIES:  IODINE CONTRAST WHICH CAUSES SWELLING AND RASH. PENICILLIN. BIAXIN. CODEINE AND NOVOCAINE.   SOCIAL HISTORY: Used to be a smoker, quit many years ago. Does have a 30 to 40-pack-year smoking history. No alcohol abuse. No illicit drug abuse. Currently resides at [facility] assisted living.   FAMILY HISTORY: Mother died from leukemia. Father died. She cannot recall as she was adopted. Malignancy seems to run in her family.   CURRENT MEDICATIONS:  Are as follows: 1.  Advair 250/50 one puff b.i.d.  2.  Artificial tears 1 drop to each eye b.i.d. 3.  Aspirin 81 mg daily. 4.  Colace 100 mg b.i.d. 5.  Iron sulfate 325 mg b.i.d. 6.  Imodium AD as needed. 7. Lantus 5 units at bedtime. 8.  Synthroid 112 mcg daily. 9.  Lisinopril 5 mg daily. 10.  Metoprolol tartrate 25 mg b.i.d. 11.  Maalox as needed 30 mL t.i.d. 12.  Milk of Mag 30 mL also as needed at bedtime. 13.  MiraLAX daily as needed. 14.  Novolin insulin sliding scale. 15.  Omeprazole 20 mg 2 tabs b.i.d. 14.  Oyster calcium 1 tab b.i.d.Marland Kitchen 15.  Albuterol inhaler 2 puffs q. 4 to 6 hours as needed. 16.  Senokot 1 tab b.i.d. 17.  Tylenol Extra Strength 500 mg 4 hours as needed. 18.  Vitamin B12 1000 mcg daily.   PHYSICAL EXAMINATION ON ADMISSION:   VITAL SIGNS:  _____ Temperature is 98.3, pulse 98, respirations 24, blood pressure 159/67, sats 94% on BiPAP.  GENERAL:  She is a pleasant-appearing female talking in full sentences in mild respiratory. HEAD, EYES, EARS, NOSE AND THROAT:  She is atraumatic, normocephalic. Extraocular muscles are  intact. Pupils equal and react to light. Sclerae anicteric. No conjunctival injection. No pharyngeal erythema.  NECK:  Supple. There is no jugular venous distention. No bruits. No lymphadenopathy. No thyromegaly.  HEART:  Regular rate and rhythm. No murmurs. No rubs. No clicks.  LUNGS:  She has coarse rhonchi diffusely; otherwise, negative use of accessory muscles. No dullness to percussion. No wheezing. Good air entry bilaterally.  ABDOMEN: Soft, flat, nontender, nondistended. Has good bowel sounds. No hepatosplenomegaly appreciated.  EXTREMITIES:  No evidence of any cyanosis or clubbing. She does have +1 to 2 pitting edema from the knees to the ankles bilaterally. SKIN:  Moist, warm but no rashes.  LYMPHATIC: There is no cervical or axillary lymphadenopathy.  NEUROLOGICAL:  She is alert, awake and oriented x 3, globally weak; otherwise, no other focal motor or sensory deficits appreciated bilaterally.  LABORATORY EXAMINATION ON ADMISSION:  Showed a serum glucose of 161, BUN 16, creatinine 1.3, sodium 138, potassium 4.5, chloride 97, bicarb 33. LFTs are within normal limits. Troponin less than 0.02. White cell count 14, hemoglobin 15.2, hematocrit 47.1, platelet count of 191. INR 0.9.   The patient did have a chest x-ray done, which shows cardiomegaly with interstitial infiltrate, which could represent edematous or nonedematous.  COPD present.  ASSESSMENT AND PLAN:  This is an 81 year old female with a history of chronic obstructive pulmonary disease, coronary artery disease, history of myocardial infarction, diabetes, hypertension, hyperlipidemia, hypothyroidism, chronic kidney disease stage III, history of colon cancer, osteoarthritis who presents the hospital due to shortness of breath, cough and productive sputum and likely chronic obstructive pulmonary disease exacerbation.  PROBLEM: 1.  Chronic obstructive pulmonary disease exacerbation. I presume this is likely secondary to pneumonia. I will  start the patient on some IV steroids, continue around-the-clock nebulizer treatments, continue Advair. We will treat her underlying pneumonia with vancomycin and Levaquin and follow her clinically. Follow blood and sputum cultures. The patient currently on BiPAP and would like to be in their office quickly as possible. Follow serial ABGs. I will also assess her for home oxygen prior to discharge. 2.  Pneumonia. I will go ahead and treat the patient with IV Levaquin and vancomycin, follow blood and sputum cultures and follow clinically. 3.  Leukocytosis. I suspect this is secondary to pneumonia. I will follow her white cell count with treatment IV antibiotics. 4.  Chronic kidney disease, stage III.  Creatinine is currently at baseline with renal dose medications, avoid nephrotoxins. Follow renal function closely. 5.  Diabetes. Continue Lantus sliding scale insulin, place her on a carbohydrate-controlled diet. Follow blood sugars. I will go ahead and check a hemoglobin A1c.. 6.  Hypothyroidism. Continue Synthroid. 7.  History of coronary artery disease. Currently, the patient has no chest pain. I will continue aspirin, beta blockers and ACE inhibitor as stated.   CODE STATUS:  THE PATIENT IS A FULL CODE.  Time Spent with the admission: 50 minutes    ____________________________ Belia Heman. Verdell Carmine, MD vjs:ce D: 08/21/2012 15:21:36 ET T: 08/21/2012 15:38:45 ET JOB#: [id number]  cc: Belia Heman. Verdell Carmine, MD, <Dictator> [email or web address]  Gaetano Net MD ELECTRONICALLY SIGNED 08/22/2012 15:34

## 2014-11-14 NOTE — Consult Note (Signed)
CC: abd pain.  See NP for complete consult. Pt with abd pain in epigastric area and across abd, bloating feeling.  Worse after eating, on exam there is some epigastric tenderness which decreases with abd wall tighening.  Due to her recent pneumonia and plans for a stress test I will hold on any endoscopic procedures at this time.  She had a normal EGD and colon about 18 months ago.  Will add in carafate to her regimen.  Electronic Signatures: Manya Silvas (MD)  (Signed on 17-Feb-14 21:20)  Authored  Last Updated: 17-Feb-14 21:20 by Manya Silvas (MD)

## 2014-11-14 NOTE — Consult Note (Signed)
Pt CC: RU abd pain.  She had an EGD 04/2011 with Dr. Candace Cruise.  She is feeling better today but after she rubs her RUQ she will have discomfort.  Recommend BId PPi, soft diet, no need for repeat EGD at this time.  Can follow up with me as out pt if needed.  I will sign off, reconsult if needed.  Electronic Signatures: Manya Silvas (MD)  (Signed on 19-Feb-14 17:22)  Authored  Last Updated: 19-Feb-14 17:22 by Manya Silvas (MD)

## 2014-11-14 NOTE — Discharge Summary (Signed)
PATIENT NAME:  Tina [name], BLOOR MR#:  [id number] DATE OF BIRTH:  04/29/1928  DATE OF ADMISSION:  08/21/2012 DATE OF DISCHARGE:  08/29/2012  PRIMARY CARE PHYSICIAN: Unknown.  DISCHARGE DIAGNOSES:  1. Chronic obstructive pulmonary disease exacerbation.  2. Pneumonia.  3. Diabetes.  4. Coronary artery disease  5. Hyperkalemia.  6. Dehydration.   CONDITION: Stable.   CODE STATUS: Full code.   HOME MEDICATIONS: Please refer to the [facility] physician discharge instructions. New medication: Prednisone 10 mg 2 tablets p.o. daily for 2 days, 10 mg 1 tablet p.o. daily for 2 days, Levaquin 750 mg p.o. every 48 hours for 5 days   DIET: Low sodium, low fat, low cholesterol diet.   ACTIVITY: As tolerated.   FOLLOWUP CARE: Follow up with PCP within 1 week.   REASON FOR ADMISSION: Shortness of breath.   HOSPITAL COURSE: The patient is an 81 year old Caucasian female with a history of COPD, CAD, hypertension and hyperlipidemia, who presented to the ED with shortness of breath. The patient also had chills, fever. In the ED, the patient was noted to be hypoxic, with O2 saturation at the mid 80s, and was noted to be in hypercapnic hypoxic respiratory failure secondary to COPD, so the patient was placed on BiPAP and admitted for acute on chronic respiratory failure, COPD exacerbation and pneumonia. For detailed history and physical examination, please refer to the admission note dictated by Dr. Verdell Carmine.   On admission date, the patient's chest x-ray showed cardiomegaly with interstitial infiltrate. WBC 14, hemoglobin 15.2, BUN 16, creatinine 1.3.   1. After admission, the patient has been treated with IV Solu-Medrol, nebulizers around the clock and Advair.  2. For pneumonia, the patient initially was treated with  vancomycin and then changed to Levaquin.  3. For acute respiratory failure, the patient was placed on BiPAP initially and then changed to oxygen by nasal cannula after improvement.  4. The patient  has a history of CKD. BUN has been stable around 30, creatinine is normal, stage III CKD.  5. For diabetes, the patient has been treated with sliding scale with Lantus 5 units at bedtime.  6. Hyperkalemia. The patient developed hyperkalemia with potassium increasing to 5.4. Was treated with Kayexalate, and potassium decreased to 4.1 today.  7. The patient also had hyponatremia. Sodium was low at 130, and then increased to 134 today.   The patient's symptoms are much improved. According to physical therapy recommendations, the patient will be discharged back to assisted living facility today. I discussed the patient's discharge plan with the case manager.   TIME SPENT: About 45 minutes.    ____________________________ Demetrios Loll, MD qc:OSi D: 08/29/2012 17:41:40 ET T: 08/30/2012 06:38:06 ET JOB#: [id number]  cc: Demetrios Loll, MD, <Dictator> Demetrios Loll MD ELECTRONICALLY SIGNED 08/30/2012 18:41

## 2015-08-25 ENCOUNTER — Encounter: Payer: Self-pay | Admitting: *Deleted

## 2015-08-25 ENCOUNTER — Inpatient Hospital Stay
Admission: EM | Admit: 2015-08-25 | Discharge: 2015-08-27 | DRG: 291 | Disposition: A | Discharge status: Home Health | Payer: Medicare Other | Attending: Internal Medicine | Admitting: Internal Medicine

## 2015-08-25 ENCOUNTER — Emergency Department: Payer: Medicare Other

## 2015-08-25 DIAGNOSIS — I5033 Acute on chronic diastolic (congestive) heart failure: Secondary | ICD-10-CM | POA: Diagnosis present

## 2015-08-25 DIAGNOSIS — I509 Heart failure, unspecified: Secondary | ICD-10-CM

## 2015-08-25 DIAGNOSIS — J449 Chronic obstructive pulmonary disease, unspecified: Secondary | ICD-10-CM | POA: Diagnosis present

## 2015-08-25 DIAGNOSIS — Z9842 Cataract extraction status, left eye: Secondary | ICD-10-CM | POA: Diagnosis not present

## 2015-08-25 DIAGNOSIS — I2510 Atherosclerotic heart disease of native coronary artery without angina pectoris: Secondary | ICD-10-CM | POA: Diagnosis present

## 2015-08-25 DIAGNOSIS — Z90710 Acquired absence of both cervix and uterus: Secondary | ICD-10-CM | POA: Diagnosis not present

## 2015-08-25 DIAGNOSIS — Z833 Family history of diabetes mellitus: Secondary | ICD-10-CM

## 2015-08-25 DIAGNOSIS — Z993 Dependence on wheelchair: Secondary | ICD-10-CM | POA: Diagnosis not present

## 2015-08-25 DIAGNOSIS — Z9119 Patient's noncompliance with other medical treatment and regimen: Secondary | ICD-10-CM

## 2015-08-25 DIAGNOSIS — Z9841 Cataract extraction status, right eye: Secondary | ICD-10-CM | POA: Diagnosis not present

## 2015-08-25 DIAGNOSIS — E1122 Type 2 diabetes mellitus with diabetic chronic kidney disease: Secondary | ICD-10-CM | POA: Diagnosis present

## 2015-08-25 DIAGNOSIS — F0390 Unspecified dementia without behavioral disturbance: Secondary | ICD-10-CM | POA: Diagnosis present

## 2015-08-25 DIAGNOSIS — Z9861 Coronary angioplasty status: Secondary | ICD-10-CM

## 2015-08-25 DIAGNOSIS — Z85038 Personal history of other malignant neoplasm of large intestine: Secondary | ICD-10-CM

## 2015-08-25 DIAGNOSIS — E669 Obesity, unspecified: Secondary | ICD-10-CM | POA: Diagnosis present

## 2015-08-25 DIAGNOSIS — M1990 Unspecified osteoarthritis, unspecified site: Secondary | ICD-10-CM | POA: Diagnosis present

## 2015-08-25 DIAGNOSIS — N183 Chronic kidney disease, stage 3 (moderate): Secondary | ICD-10-CM | POA: Diagnosis present

## 2015-08-25 DIAGNOSIS — I130 Hypertensive heart and chronic kidney disease with heart failure and stage 1 through stage 4 chronic kidney disease, or unspecified chronic kidney disease: Principal | ICD-10-CM | POA: Diagnosis present

## 2015-08-25 DIAGNOSIS — L8990 Pressure ulcer of unspecified site, unspecified stage: Secondary | ICD-10-CM | POA: Diagnosis present

## 2015-08-25 DIAGNOSIS — Z87891 Personal history of nicotine dependence: Secondary | ICD-10-CM

## 2015-08-25 DIAGNOSIS — Z9049 Acquired absence of other specified parts of digestive tract: Secondary | ICD-10-CM | POA: Diagnosis not present

## 2015-08-25 DIAGNOSIS — Z9981 Dependence on supplemental oxygen: Secondary | ICD-10-CM

## 2015-08-25 DIAGNOSIS — R0902 Hypoxemia: Secondary | ICD-10-CM

## 2015-08-25 HISTORY — DX: Anemia, unspecified: D64.9

## 2015-08-25 HISTORY — DX: Obesity, unspecified: E66.9

## 2015-08-25 HISTORY — DX: Chronic kidney disease, stage 3 unspecified: N18.30

## 2015-08-25 HISTORY — DX: Bronchitis, not specified as acute or chronic: J40

## 2015-08-25 HISTORY — DX: Chronic obstructive pulmonary disease, unspecified: J44.9

## 2015-08-25 HISTORY — DX: Atherosclerotic heart disease of native coronary artery without angina pectoris: I25.10

## 2015-08-25 HISTORY — DX: Type 2 diabetes mellitus without complications: E11.9

## 2015-08-25 HISTORY — DX: Unspecified osteoarthritis, unspecified site: M19.90

## 2015-08-25 HISTORY — DX: Type 2 diabetes mellitus with diabetic chronic kidney disease: E11.22

## 2015-08-25 HISTORY — DX: Anxiety disorder, unspecified: F41.9

## 2015-08-25 HISTORY — DX: Malignant neoplasm of colon, unspecified: C18.9

## 2015-08-25 HISTORY — DX: Chronic kidney disease, stage 3 (moderate): N18.3

## 2015-08-25 LAB — COMPREHENSIVE METABOLIC PANEL
ALBUMIN: 3.1 g/dL — AB (ref 3.5–5.0)
ALT: 13 U/L — ABNORMAL LOW (ref 14–54)
ANION GAP: 5 (ref 5–15)
AST: 18 U/L (ref 15–41)
Alkaline Phosphatase: 74 U/L (ref 38–126)
BILIRUBIN TOTAL: 0.7 mg/dL (ref 0.3–1.2)
BUN: 29 mg/dL — ABNORMAL HIGH (ref 6–20)
CHLORIDE: 97 mmol/L — AB (ref 101–111)
CO2: 29 mmol/L (ref 22–32)
Calcium: 8.9 mg/dL (ref 8.9–10.3)
Creatinine, Ser: 1.64 mg/dL — ABNORMAL HIGH (ref 0.44–1.00)
GFR calc Af Amer: 32 mL/min — ABNORMAL LOW (ref >60)
GFR, EST NON AFRICAN AMERICAN: 27 mL/min — AB (ref >60)
Glucose, Bld: 128 mg/dL — ABNORMAL HIGH (ref 65–99)
POTASSIUM: 4.6 mmol/L (ref 3.5–5.1)
Sodium: 131 mmol/L — ABNORMAL LOW (ref 135–145)
TOTAL PROTEIN: 6.6 g/dL (ref 6.5–8.1)

## 2015-08-25 LAB — URINALYSIS COMPLETE WITH MICROSCOPIC (ARMC ONLY)
BILIRUBIN URINE: NEGATIVE
Glucose, UA: NEGATIVE mg/dL
HGB URINE DIPSTICK: NEGATIVE
Ketones, ur: NEGATIVE mg/dL
LEUKOCYTES UA: NEGATIVE
Nitrite: NEGATIVE
PH: 5 (ref 5.0–8.0)
PROTEIN: NEGATIVE mg/dL
RBC / HPF: NONE SEEN RBC/hpf (ref 0–5)
Specific Gravity, Urine: 1.004 — ABNORMAL LOW (ref 1.005–1.030)

## 2015-08-25 LAB — CBC WITH DIFFERENTIAL/PLATELET
BASOS ABS: 0.1 10*3/uL (ref 0–0.1)
Basophils Relative: 1 %
Eosinophils Absolute: 0.2 10*3/uL (ref 0–0.7)
Eosinophils Relative: 2 %
HEMATOCRIT: 38.5 % (ref 35.0–47.0)
HEMOGLOBIN: 12.4 g/dL (ref 12.0–16.0)
LYMPHS ABS: 1.6 10*3/uL (ref 1.0–3.6)
LYMPHS PCT: 16 %
MCH: 28.9 pg (ref 26.0–34.0)
MCHC: 32.3 g/dL (ref 32.0–36.0)
MCV: 89.5 fL (ref 80.0–100.0)
Monocytes Absolute: 0.6 10*3/uL (ref 0.2–0.9)
Monocytes Relative: 6 %
NEUTROS ABS: 7.7 10*3/uL — AB (ref 1.4–6.5)
NEUTROS PCT: 75 %
PLATELETS: 253 10*3/uL (ref 150–440)
RBC: 4.31 MIL/uL (ref 3.80–5.20)
RDW: 14.8 % — ABNORMAL HIGH (ref 11.5–14.5)
WBC: 10.1 10*3/uL (ref 3.6–11.0)

## 2015-08-25 LAB — LACTIC ACID, PLASMA
LACTIC ACID, VENOUS: 1 mmol/L (ref 0.5–2.0)
Lactic Acid, Venous: 1.1 mmol/L (ref 0.5–2.0)

## 2015-08-25 LAB — GLUCOSE, CAPILLARY: Glucose-Capillary: 145 mg/dL — ABNORMAL HIGH (ref 65–99)

## 2015-08-25 LAB — TROPONIN I
Troponin I: <0.03 ng/mL (ref <0.031)
Troponin I: 0.03 ng/mL (ref <0.031)

## 2015-08-25 MED ORDER — ALBUTEROL SULFATE (2.5 MG/3ML) 0.083% IN NEBU: 3.0000 mL | INHALATION_SOLUTION | RESPIRATORY_TRACT | Status: DC | PRN

## 2015-08-25 MED ORDER — LEVOTHYROXINE SODIUM 112 MCG PO TABS
112.0000 ug | ORAL_TABLET | Freq: Every day | ORAL | Status: DC
  Administered 2015-08-26 – 2015-08-27 (×2): 112 ug via ORAL
  Filled 2015-08-25 (×2): qty 1

## 2015-08-25 MED ORDER — SODIUM CHLORIDE 0.9% FLUSH
3.0000 mL | Freq: Two times a day (BID) | INTRAVENOUS | Status: DC
  Administered 2015-08-25 – 2015-08-27 (×4): 3 mL via INTRAVENOUS

## 2015-08-25 MED ORDER — ACETAMINOPHEN 500 MG PO TABS: 500.0000 mg | ORAL_TABLET | ORAL | Status: DC | PRN

## 2015-08-25 MED ORDER — METOPROLOL TARTRATE 25 MG PO TABS
25.0000 mg | ORAL_TABLET | Freq: Two times a day (BID) | ORAL | Status: DC
  Administered 2015-08-25 – 2015-08-26 (×2): 25 mg via ORAL
  Filled 2015-08-25 (×2): qty 1

## 2015-08-25 MED ORDER — TRIPLE ANTIBIOTIC 5-400-5000 EX OINT
1.0000 "application " | TOPICAL_OINTMENT | CUTANEOUS | Status: DC | PRN
  Filled 2015-08-25: qty 1

## 2015-08-25 MED ORDER — LISINOPRIL 5 MG PO TABS
5.0000 mg | ORAL_TABLET | Freq: Every day | ORAL | Status: DC
  Administered 2015-08-25 – 2015-08-26 (×2): 5 mg via ORAL
  Filled 2015-08-25 (×2): qty 1

## 2015-08-25 MED ORDER — ASPIRIN 81 MG PO CHEW
81.0000 mg | CHEWABLE_TABLET | Freq: Every day | ORAL | Status: DC
  Administered 2015-08-26 – 2015-08-27 (×2): 81 mg via ORAL
  Filled 2015-08-25 (×2): qty 1

## 2015-08-25 MED ORDER — FERROUS SULFATE 325 (65 FE) MG PO TABS
325.0000 mg | ORAL_TABLET | Freq: Every day | ORAL | Status: DC
  Administered 2015-08-26 – 2015-08-27 (×2): 325 mg via ORAL
  Filled 2015-08-25 (×2): qty 1

## 2015-08-25 MED ORDER — PANTOPRAZOLE SODIUM 40 MG PO TBEC
40.0000 mg | DELAYED_RELEASE_TABLET | Freq: Every day | ORAL | Status: DC
  Administered 2015-08-25 – 2015-08-27 (×3): 40 mg via ORAL
  Filled 2015-08-25 (×3): qty 1

## 2015-08-25 MED ORDER — LORAZEPAM 0.5 MG PO TABS
0.5000 mg | ORAL_TABLET | Freq: Three times a day (TID) | ORAL | Status: DC | PRN
  Administered 2015-08-25 – 2015-08-26 (×2): 0.5 mg via ORAL
  Filled 2015-08-25 (×2): qty 1

## 2015-08-25 MED ORDER — VITAMIN D (ERGOCALCIFEROL) 1.25 MG (50000 UNIT) PO CAPS
50000.0000 [IU] | ORAL_CAPSULE | ORAL | Status: DC
  Administered 2015-08-25: 50000 [IU] via ORAL
  Filled 2015-08-25: qty 1

## 2015-08-25 MED ORDER — ACETAMINOPHEN 650 MG RE SUPP: 650.0000 mg | Freq: Four times a day (QID) | RECTAL | Status: DC | PRN

## 2015-08-25 MED ORDER — INSULIN ASPART 100 UNIT/ML ~~LOC~~ SOLN: 0.0000 [IU] | Freq: Every day | SUBCUTANEOUS | Status: DC

## 2015-08-25 MED ORDER — ALUM & MAG HYDROXIDE-SIMETH 200-200-20 MG/5ML PO SUSP: 30.0000 mL | Freq: Four times a day (QID) | ORAL | Status: DC | PRN

## 2015-08-25 MED ORDER — FUROSEMIDE 10 MG/ML IJ SOLN
40.0000 mg | Freq: Two times a day (BID) | INTRAMUSCULAR | Status: DC
  Administered 2015-08-26: 40 mg via INTRAVENOUS
  Filled 2015-08-25: qty 4

## 2015-08-25 MED ORDER — POLYVINYL ALCOHOL 1.4 % OP SOLN
1.0000 [drp] | Freq: Two times a day (BID) | OPHTHALMIC | Status: DC
Start: 2015-08-25 — End: 2015-08-27
  Administered 2015-08-25 – 2015-08-27 (×4): 1 [drp] via OPHTHALMIC
  Filled 2015-08-25: qty 15

## 2015-08-25 MED ORDER — ENOXAPARIN SODIUM 30 MG/0.3ML ~~LOC~~ SOLN
30.0000 mg | SUBCUTANEOUS | Status: DC
  Administered 2015-08-25 – 2015-08-26 (×2): 30 mg via SUBCUTANEOUS
  Filled 2015-08-25 (×2): qty 0.3

## 2015-08-25 MED ORDER — POLYETHYLENE GLYCOL 3350 17 G PO PACK: 17.0000 g | PACK | Freq: Every day | ORAL | Status: DC | PRN

## 2015-08-25 MED ORDER — INSULIN GLARGINE 100 UNIT/ML ~~LOC~~ SOLN
10.0000 [IU] | Freq: Every day | SUBCUTANEOUS | Status: DC
  Administered 2015-08-25 – 2015-08-26 (×2): 10 [IU] via SUBCUTANEOUS
  Filled 2015-08-25 (×3): qty 0.1

## 2015-08-25 MED ORDER — INSULIN ASPART 100 UNIT/ML ~~LOC~~ SOLN
0.0000 [IU] | Freq: Three times a day (TID) | SUBCUTANEOUS | Status: DC
  Administered 2015-08-26: 1 [IU] via SUBCUTANEOUS
  Filled 2015-08-25: qty 1

## 2015-08-25 MED ORDER — ONDANSETRON HCL 4 MG PO TABS: 4.0000 mg | ORAL_TABLET | Freq: Four times a day (QID) | ORAL | Status: DC | PRN

## 2015-08-25 MED ORDER — FUROSEMIDE 10 MG/ML IJ SOLN
40.0000 mg | Freq: Once | INTRAMUSCULAR | Status: AC
  Administered 2015-08-25: 40 mg via INTRAVENOUS
  Filled 2015-08-25: qty 4

## 2015-08-25 MED ORDER — MOMETASONE FURO-FORMOTEROL FUM 100-5 MCG/ACT IN AERO
2.0000 | INHALATION_SPRAY | Freq: Two times a day (BID) | RESPIRATORY_TRACT | Status: DC
  Administered 2015-08-25 – 2015-08-27 (×4): 2 via RESPIRATORY_TRACT
  Filled 2015-08-25: qty 8.8

## 2015-08-25 MED ORDER — ACETAMINOPHEN 325 MG PO TABS
650.0000 mg | ORAL_TABLET | Freq: Four times a day (QID) | ORAL | Status: DC | PRN
  Administered 2015-08-26 – 2015-08-27 (×3): 650 mg via ORAL
  Filled 2015-08-25 (×3): qty 2

## 2015-08-25 MED ORDER — DOCUSATE SODIUM 100 MG PO CAPS
100.0000 mg | ORAL_CAPSULE | Freq: Two times a day (BID) | ORAL | Status: DC
  Administered 2015-08-26 – 2015-08-27 (×3): 100 mg via ORAL
  Filled 2015-08-25 (×2): qty 1

## 2015-08-25 MED ORDER — CALCIUM CARBONATE ANTACID 500 MG PO CHEW
1.0000 | CHEWABLE_TABLET | Freq: Two times a day (BID) | ORAL | Status: DC
  Administered 2015-08-25 – 2015-08-27 (×4): 200 mg via ORAL
  Filled 2015-08-25 (×4): qty 1

## 2015-08-25 MED ORDER — DOCUSATE SODIUM 100 MG PO CAPS
100.0000 mg | ORAL_CAPSULE | Freq: Two times a day (BID) | ORAL | Status: DC
  Administered 2015-08-25: 100 mg via ORAL
  Filled 2015-08-25 (×2): qty 1

## 2015-08-25 MED ORDER — GUAIFENESIN ER 600 MG PO TB12
600.0000 mg | ORAL_TABLET | Freq: Two times a day (BID) | ORAL | Status: DC
  Administered 2015-08-25 – 2015-08-27 (×4): 600 mg via ORAL
  Filled 2015-08-25 (×4): qty 1

## 2015-08-25 MED ORDER — CLOTRIMAZOLE 1 % EX CREA
1.0000 "application " | TOPICAL_CREAM | CUTANEOUS | Status: DC | PRN
  Filled 2015-08-25: qty 15

## 2015-08-25 MED ORDER — ONDANSETRON HCL 4 MG/2ML IJ SOLN: 4.0000 mg | Freq: Four times a day (QID) | INTRAMUSCULAR | Status: DC | PRN

## 2015-08-25 MED ORDER — MAGNESIUM HYDROXIDE 400 MG/5ML PO SUSP: 30.0000 mL | Freq: Every evening | ORAL | Status: DC | PRN

## 2015-08-25 MED ORDER — GUAIFENESIN 100 MG/5ML PO SYRP
200.0000 mg | ORAL_SOLUTION | Freq: Four times a day (QID) | ORAL | Status: DC | PRN
  Filled 2015-08-25 (×2): qty 10

## 2015-08-25 MED ORDER — LACTULOSE 10 GM/15ML PO SOLN
20.0000 g | Freq: Every day | ORAL | Status: DC
  Administered 2015-08-25 – 2015-08-27 (×2): 20 g via ORAL
  Filled 2015-08-25 (×3): qty 30

## 2015-08-25 MED ORDER — SUCRALFATE 1 G PO TABS
1.0000 g | ORAL_TABLET | Freq: Three times a day (TID) | ORAL | Status: DC
  Administered 2015-08-25 – 2015-08-27 (×6): 1 g via ORAL
  Filled 2015-08-25 (×7): qty 1

## 2015-08-25 NOTE — ED Notes (Signed)
Chipper Herb pt daughter phone number [phone]

## 2015-08-25 NOTE — ED Provider Notes (Signed)
[facility] Emergency Department Provider Note  ____________________________________________   I have reviewed the triage vital signs and the nursing notes.   HISTORY  Chief Complaint Shortness of Breath    HPI Tina [name] is a 82 y.o. female who has a history according to family of renal insufficiency, they believe CHF with fluid pills at home until they are not sure, patient herself does not know, as well as COPD on when necessary home oxygen. Patient according to family has had increasing torus of breath over the last few days. She herself says "I guess so". Patient would prefer not to be here. She is at her baseline mentation according to family. The patient has had no chest pain. She does have lower showed edema which is perhaps slightly worse than normal. No fever. No productive cough that they can elicit a history from the patient about. History is very limited, patient is at her baseline but does suffer some some baseline dementia and does not answer questions much about what I ask her. EMS states the patient was off of her oxygen when they arrived and her sats were in the 44s.  Past Medical History  Diagnosis Date  . Diabetes mellitus without complication ([place])   . Renal disorder   . Anxiety   . Osteoarthritis   . Anemia   . Bronchitis     There are no active problems to display for this patient.   History reviewed. No pertinent past surgical history.  No current outpatient prescriptions on file.  Allergies Corn-containing products  History reviewed. No pertinent family history.  Social History Social History  Substance Use Topics  . Smoking status: Unknown If Ever Smoked  . Smokeless tobacco: None  . Alcohol Use: None    Review of Systems Constitutional: No fever/chills Eyes: No visual changes. ENT: No sore throat. No stiff neck no neck pain Cardiovascular: Denies chest pain. Respiratory: Positive shortness of  breath Gastrointestinal:   no vomiting.  No diarrhea.  No constipation. Genitourinary: Negative for dysuria. Musculoskeletal: Positive lower extremity swelling Skin: Negative for rash. Neurological: Negative for headaches, focal weakness or numbness. 10-point ROS otherwise negative.  ____________________________________________   PHYSICAL EXAM:  VITAL SIGNS: ED Triage Vitals  Enc Vitals Group     BP 08/25/15 1632 82/68 mmHg     Pulse Rate 08/25/15 1632 61     Resp --      Temp 08/25/15 1632 99.2 F (37.3 C)     Temp Source 08/25/15 1632 Oral     SpO2 08/25/15 1632 91 %     Weight 08/25/15 1632 200 lb (90.719 kg)     Height 08/25/15 1632 5\' 7"  (1.702 m)     Head Cir --      Peak Flow --      Pain Score 08/25/15 1632 4     Pain Loc --      Pain Edu? --      Excl. in [place]? --     Constitutional: Alert and oriented. Well appearing and in no acute distress. Eyes: Conjunctivae are normal. PERRL. EOMI. Head: Atraumatic. Nose: No congestion/rhinnorhea. Mouth/Throat: Mucous membranes are moist.  Oropharynx non-erythematous. Neck: No stridor.   Nontender with no meningismus Cardiovascular: Normal rate, regular rhythm. Grossly normal heart sounds.  Good peripheral circulation. Respiratory: She is Rales noted in the bottom lung fields, no retractions, normal respiratory effort at this time. Abdominal: Soft and nontender. No distention. No guarding no rebound Back:  There is  no focal tenderness or step off there is no midline tenderness there are no lesions noted. there is no CVA tenderness Musculoskeletal: No lower extremity tenderness. No joint effusions, no DVT signs strong distal pulses positive symmetric edema Neurologic:  Normal speech and language. No gross focal neurologic deficits are appreciated.  Skin:  Skin is warm, dry and intact. No rash noted. Psychiatric: Mood and affect are normal. Speech and behavior are normal.  ____________________________________________    LABS (all labs ordered are listed, but only abnormal results are displayed)  Labs Reviewed  COMPREHENSIVE METABOLIC PANEL - Abnormal; Notable for the following:    Sodium 131 (*)    Chloride 97 (*)    Glucose, Bld 128 (*)    BUN 29 (*)    Creatinine, Ser 1.64 (*)    Albumin 3.1 (*)    ALT 13 (*)    GFR calc non Af Amer 27 (*)    GFR calc Af Amer 32 (*)    All other components within normal limits  CBC WITH DIFFERENTIAL/PLATELET - Abnormal; Notable for the following:    RDW 14.8 (*)    Neutro Abs 7.7 (*)    All other components within normal limits  URINALYSIS COMPLETEWITH MICROSCOPIC ([facility] ONLY) - Abnormal; Notable for the following:    Color, Urine STRAW (*)    APPearance CLEAR (*)    Specific Gravity, Urine 1.004 (*)    Bacteria, UA RARE (*)    Squamous Epithelial / LPF 0-5 (*)    All other components within normal limits  CULTURE, BLOOD (ROUTINE X 2)  CULTURE, BLOOD (ROUTINE X 2)  LACTIC ACID, PLASMA  TROPONIN I  LACTIC ACID, PLASMA   ____________________________________________  EKG  I personally interpreted any EKGs ordered by me or triage Normal sinus rhythm at 69 bpm no acute ST elevation or acute ST depression nonspecific ST changes noted diffusely ____________________________________________  RADIOLOGY  I reviewed any imaging ordered by me or triage that were performed during my shift ____________________________________________   PROCEDURES  Procedure(s) performed: None  Critical Care performed: CRITICAL CARE Performed by: Schuyler Amor   Total critical care time: 35 minutes  Critical care time was exclusive of separately billable procedures and treating other patients.  Critical care was necessary to treat or prevent imminent or life-threatening deterioration.  Critical care was time spent personally by me on the following activities: development of treatment plan with patient and/or surrogate as well as nursing, discussions with  consultants, evaluation of patient's response to treatment, examination of patient, obtaining history from patient or surrogate, ordering and performing treatments and interventions, ordering and review of laboratory studies, ordering and review of radiographic studies, pulse oximetry and re-evaluation of patient's condition.   ____________________________________________   INITIAL IMPRESSION / ASSESSMENT AND PLAN / ED COURSE  Pertinent labs & imaging results that were available during my care of the patient were reviewed by me and considered in my medical decision making (see chart for details).  I did recheck the patient's blood pressure, the cuff was somewhat loose on her arm, I got 120 over palp. I think this is more consistent with her appearance. Accordingly, I did stop her IV fluids. The patient has had 500 cc. She has evidence of CHF on chest x-ray no evidence of pneumonia. Her white count is not elevated. Lactic is negative. No evidence of urinary tract infection. Her kidney function is somewhat worse than baseline. There may be some element of dehydration with given her CHF and  hypoxia and I am not quite to give her more fluid. Fact, because of her low saturation upon arrival and apparently patient is only when necessary oxygen user at home, I will give her Lasix, only 40, to see if we can mobilize some fluid again about her clinical picture. Bone is negative. ____________________________________________   FINAL CLINICAL IMPRESSION(S) / ED DIAGNOSES  Final diagnoses:  None      This chart was dictated using voice recognition software.  Despite best efforts to proofread,  errors can occur which can change meaning.     Schuyler Amor, MD 08/25/15 1758

## 2015-08-25 NOTE — H&P (Signed)
[facility] at [place] NAME: Tina [name]    MR#:  [id number]  DATE OF BIRTH:  1927/09/05  DATE OF ADMISSION:  08/25/2015  PRIMARY CARE PHYSICIAN: Drs. making house calls  REQUESTING/REFERRING PHYSICIAN: Dr. Larae Grooms  CHIEF COMPLAINT:   Chief Complaint  Patient presents with  . Shortness of Breath    HISTORY OF PRESENT ILLNESS:  Tina [name]  is a 82 y.o. female with a known history of obesity, CK D stage III, diabetes mellitus, hypertension, CAD, COPD on home oxygen, history of colon cancer in remission who is from [place] assisted living facility was brought in secondary to worsening shortness of breath. For the past week patient has been having difficulty breathing, had worsened cough for the last couple of days. She feels congested and unable to cough anything out. No fevers or chills. No nausea vomiting or ear complaints. Denies any chest pain. Also complains of orthopnea. No palpitations. She has been having 2+ pedal edema going on for a long time. She does take a diuretic at home. Her medications are being verified at this time.  PAST MEDICAL HISTORY:   Past Medical History  Diagnosis Date  . Diabetes mellitus without complication ([place])   . Renal disorder   . Anxiety   . Osteoarthritis   . Anemia   . Bronchitis   . CKD stage 3 due to type 2 diabetes mellitus ([place])   . Obesity   . CAD (coronary artery disease)     s/p PCI  . COPD (chronic obstructive pulmonary disease) ([facility])     on 2L home o2  . Colon cancer ([place])     treated, surgery, chemo, radiation    PAST SURGICAL HISTORY:   Past Surgical History  Procedure Laterality Date  . Abdominal hysterectomy    . Partial colectomy    . Cataract extraction, bilateral      SOCIAL HISTORY:   Social History  Substance Use Topics  . Smoking status: Former Research scientist (life sciences)  . Smokeless tobacco: Not on file     Comment: quit 20 years ago  . Alcohol Use: No     FAMILY HISTORY:   Family History  Problem Relation Age of Onset  . Diabetes Mellitus II Mother     DRUG ALLERGIES:   Allergies  Allergen Reactions  . Corn-Containing Products     REVIEW OF SYSTEMS:   Review of Systems  Constitutional: Positive for chills and malaise/fatigue. Negative for fever and weight loss.  HENT: Negative for ear discharge, ear pain, hearing loss and nosebleeds.   Eyes: Negative for blurred vision, double vision and photophobia.  Respiratory: Positive for shortness of breath. Negative for cough, hemoptysis and wheezing.   Cardiovascular: Positive for leg swelling. Negative for chest pain, palpitations and orthopnea.  Gastrointestinal: Negative for heartburn, nausea, vomiting, abdominal pain, diarrhea, constipation and melena.  Genitourinary: Negative for dysuria, urgency, frequency and hematuria.  Musculoskeletal: Negative for myalgias, back pain and neck pain.  Skin: Negative for rash.  Neurological: Negative for dizziness, tremors, sensory change, speech change, focal weakness and headaches.  Endo/Heme/Allergies: Does not bruise/bleed easily.  Psychiatric/Behavioral: Negative for depression.    MEDICATIONS AT HOME:   Prior to Admission medications   Not on File      VITAL SIGNS:  Blood pressure 118/74, pulse 61, temperature 99.2 F (37.3 C), temperature source Oral, resp. rate 24, height 5\' 7"  (1.702 m), weight 90.719 kg (200 lb), SpO2 80 %.  PHYSICAL  EXAMINATION:   Physical Exam  GENERAL:  82 y.o.-year-old obese patient lying in the bed with no acute distress.  EYES: Pupils equal, round, reactive to light and accommodation. No scleral icterus. Extraocular muscles intact.  HEENT: Head atraumatic, normocephalic. Oropharynx and nasopharynx clear.  NECK:  Supple, no jugular venous distention. No thyroid enlargement, no tenderness.  LUNGS: Normal breath sounds bilaterally, no wheezing,rhonchi or crepitation. Bibasilar Rales heard. No use of  accessory muscles of respiration.  CARDIOVASCULAR: S1, S2 normal. No  rubs, or gallops. 3/6 systolic murmur is present ABDOMEN: Soft, nontender, nondistended. Bowel sounds present. No organomegaly or mass.  EXTREMITIES: Nocyanosis, or clubbing. 2+ pedal edema noted NEUROLOGIC: Cranial nerves II through XII are intact. Muscle strength 5/5 in all extremities. Sensation intact. Gait not checked.  PSYCHIATRIC: The patient is alert and oriented x 3. Short-term memory loss identified. SKIN: No obvious rash, lesion, or ulcer.   LABORATORY PANEL:   CBC  Recent Labs Lab 08/25/15 1636  WBC 10.1  HGB 12.4  HCT 38.5  PLT 253   ------------------------------------------------------------------------------------------------------------------  Chemistries   Recent Labs Lab 08/25/15 1636  NA 131*  K 4.6  CL 97*  CO2 29  GLUCOSE 128*  BUN 29*  CREATININE 1.64*  CALCIUM 8.9  AST 18  ALT 13*  ALKPHOS 74  BILITOT 0.7   ------------------------------------------------------------------------------------------------------------------  Cardiac Enzymes  Recent Labs Lab 08/25/15 1636  TROPONINI <0.03   ------------------------------------------------------------------------------------------------------------------  RADIOLOGY:  Dg Chest 2 View  08/25/2015  CLINICAL DATA:  Worsening cough and shortness of breath. EXAM: CHEST  2 VIEW COMPARISON:  09/07/2012 FINDINGS: The cardiac silhouette is enlarged. Mediastinal contours appear intact. There is no evidence of focal airspace consolidation, pleural effusion or pneumothorax. There is pulmonary vascular congestion. Osseous structures are without acute abnormality. Soft tissues are grossly normal. IMPRESSION: Enlarged cardiac silhouette with pulmonary vascular congestion. No evidence of focal airspace consolidation. Electronically Signed   By: Fidela Salisbury M.D.   On: 08/25/2015 17:14    EKG:   Orders placed or performed during the  hospital encounter of 08/25/15  . EKG 12-Lead  . EKG 12-Lead  . ED EKG  . ED EKG    IMPRESSION AND PLAN:   Tina [name]  is a 82 y.o. female with a known history of obesity, CK D stage III, diabetes mellitus, hypertension, CAD, COPD on home oxygen, history of colon cancer in remission who is from [facility] assisted living facility was brought in secondary to worsening shortness of breath.  #1 acute CHF exacerbation-admit to telemetry. -Last cardiac catheterization in 2014 showed EF of 60%. Likely has diastolic dysfunction. -Echocardiogram ordered, cardiology consulted. -Started on IV Lasix twice a day. -Continue aspirin, check lipid panel  #2 CK D-stage III at baseline. Worsening creatinine from 2014. No recent baseline available. BUN is about the same. Could be progression of her renal disease. -Continue to monitor carefully especially while on Lasix.  #3 diabetes mellitus-check A1c and started on sliding scale insulin. Check home medications and restart them.  #4 hypertension.-Awaiting home medications  #5 CAD-stable at this time. Denies any chest pain. Will be seen by a cardiologist. -Check echocardiogram -Troponin is negative  #6 COPD-on 2 L oxygen, noncompliant at home. Restart her inhalers. Also nebs  #7 DVT prophylaxis-on Lovenox  Physical therapy consulted. Patient is wheelchair bound at baseline. Also uses walker sometimes- Education officer, museum consulted  All the records are reviewed and case discussed with ED provider. Management plans discussed with the patient, family and they are  in agreement.  CODE STATUS: Full code  TOTAL TIME TAKING CARE OF THIS PATIENT: 50 minutes.    Gladstone Lighter M.D on 08/25/2015 at 6:31 PM  Between 7am to 6pm - Pager - [phone]  After 6pm go to [email or web address] - password [email or web address] [facility]  [facility]  Office  [phone]  CC: Primary care physician; No primary care provider on file.

## 2015-08-25 NOTE — ED Notes (Signed)
Pt arrives via EMS from [facility], EMS was called for resp difficulty but when EMS arrived pt had no complaints, pt was placed on 4L [place], upon arrival to the ER pt 78% on RA, states pain in her tailbone, awake

## 2015-08-25 NOTE — ED Notes (Addendum)
Pt very irritated and confused, continues to get frustrated with everyone touching her, when asked if she is on o2 she says no, and then several minutes later will say yes, pt lying in bed, eyes closed

## 2015-08-26 ENCOUNTER — Inpatient Hospital Stay
Admit: 2015-08-26 | Discharge: 2015-08-26 | Disposition: A | Discharge status: Home / Self Care | Payer: Medicare Other | Attending: Internal Medicine | Admitting: Internal Medicine

## 2015-08-26 DIAGNOSIS — L8990 Pressure ulcer of unspecified site, unspecified stage: Secondary | ICD-10-CM | POA: Insufficient documentation

## 2015-08-26 LAB — GLUCOSE, CAPILLARY
GLUCOSE-CAPILLARY: 115 mg/dL — AB (ref 65–99)
GLUCOSE-CAPILLARY: 114 mg/dL — AB (ref 65–99)
Glucose-Capillary: 131 mg/dL — ABNORMAL HIGH (ref 65–99)
Glucose-Capillary: 111 mg/dL — ABNORMAL HIGH (ref 65–99)

## 2015-08-26 LAB — BASIC METABOLIC PANEL
Anion gap: 5 (ref 5–15)
BUN: 28 mg/dL — ABNORMAL HIGH (ref 6–20)
CHLORIDE: 98 mmol/L — AB (ref 101–111)
CO2: 34 mmol/L — ABNORMAL HIGH (ref 22–32)
CREATININE: 1.74 mg/dL — AB (ref 0.44–1.00)
Calcium: 8.9 mg/dL (ref 8.9–10.3)
GFR, EST AFRICAN AMERICAN: 30 mL/min — AB (ref >60)
GFR, EST NON AFRICAN AMERICAN: 26 mL/min — AB (ref >60)
Glucose, Bld: 110 mg/dL — ABNORMAL HIGH (ref 65–99)
Potassium: 4.2 mmol/L (ref 3.5–5.1)
SODIUM: 137 mmol/L (ref 135–145)

## 2015-08-26 LAB — CBC
HCT: 36.9 % (ref 35.0–47.0)
Hemoglobin: 11.9 g/dL — ABNORMAL LOW (ref 12.0–16.0)
MCH: 28.8 pg (ref 26.0–34.0)
MCHC: 32.2 g/dL (ref 32.0–36.0)
MCV: 89.6 fL (ref 80.0–100.0)
PLATELETS: 245 10*3/uL (ref 150–440)
RBC: 4.12 MIL/uL (ref 3.80–5.20)
RDW: 14.7 % — AB (ref 11.5–14.5)
WBC: 7.9 10*3/uL (ref 3.6–11.0)

## 2015-08-26 LAB — TROPONIN I

## 2015-08-26 LAB — HEMOGLOBIN A1C: Hgb A1c MFr Bld: 6.5 % — ABNORMAL HIGH (ref 4.0–6.0)

## 2015-08-26 MED ORDER — FUROSEMIDE 10 MG/ML IJ SOLN
20.0000 mg | Freq: Two times a day (BID) | INTRAMUSCULAR | Status: DC
  Administered 2015-08-27: 20 mg via INTRAVENOUS
  Filled 2015-08-26: qty 2

## 2015-08-26 NOTE — Progress Notes (Signed)
[facility] at [place] NAME: Tina [name]    MR#:  [id number]  DATE OF BIRTH:  [birth year]  SUBJECTIVE:  CHIEF COMPLAINT:   Chief Complaint  Patient presents with  . Shortness of Breath   Better SOB and cough. On O2 [place] 2 L. REVIEW OF SYSTEMS:  CONSTITUTIONAL: No fever, has weakness.  EYES: No blurred or double vision.  EARS, NOSE, AND THROAT: No tinnitus or ear pain.  RESPIRATORY: has cough, shortness of breath, no wheezing or hemoptysis.  CARDIOVASCULAR: No chest pain, orthopnea, has leg edema.  GASTROINTESTINAL: No nausea, vomiting, diarrhea or abdominal pain.  GENITOURINARY: No dysuria, hematuria.  ENDOCRINE: No polyuria, nocturia,  HEMATOLOGY: No anemia, easy bruising or bleeding SKIN: No rash or lesion. MUSCULOSKELETAL: No joint pain or arthritis.   NEUROLOGIC: No tingling, numbness, weakness.  PSYCHIATRY: No anxiety or depression.   DRUG ALLERGIES:   Allergies  Allergen Reactions  . Corn-Containing Products Other (See Comments)    Reaction:  Unknown     VITALS:  Blood pressure 103/36, pulse 63, temperature 98 F (36.7 C), temperature source Oral, resp. rate 18, height 5\' 7"  (1.702 m), weight 103.284 kg (227 lb 11.2 oz), SpO2 100 %.  PHYSICAL EXAMINATION:  GENERAL:  82 y.o.-year-old patient lying in the bed with no acute distress.  EYES: Pupils equal, round, reactive to light and accommodation. No scleral icterus. Extraocular muscles intact.  HEENT: Head atraumatic, normocephalic. Oropharynx and nasopharynx clear.  NECK:  Supple, no jugular venous distention. No thyroid enlargement, no tenderness.  LUNGS: Normal breath sounds bilaterally, no wheezing,has bilater rales, no rhonchi or crepitation. No use of accessory muscles of respiration.  CARDIOVASCULAR: S1, S2 normal. No murmurs, rubs, or gallops.  ABDOMEN: Soft, nontender, nondistended. Bowel sounds present. No organomegaly or mass.  EXTREMITIES: bilateral leg  edema 1+, no cyanosis, or clubbing.  NEUROLOGIC: Cranial nerves II through XII are intact. Muscle strength 4/5 in all extremities. Sensation intact. Gait not checked.  PSYCHIATRIC: The patient is alert and oriented x 3.  SKIN: No obvious rash, lesion, or ulcer.    LABORATORY PANEL:   CBC  Recent Labs Lab 08/26/15 0605  WBC 7.9  HGB 11.9*  HCT 36.9  PLT 245   ------------------------------------------------------------------------------------------------------------------  Chemistries   Recent Labs Lab 08/25/15 1636 08/26/15 0605  NA 131* 137  K 4.6 4.2  CL 97* 98*  CO2 29 34*  GLUCOSE 128* 110*  BUN 29* 28*  CREATININE 1.64* 1.74*  CALCIUM 8.9 8.9  AST 18  --   ALT 13*  --   ALKPHOS 74  --   BILITOT 0.7  --    ------------------------------------------------------------------------------------------------------------------  Cardiac Enzymes  Recent Labs Lab 08/26/15 1213  TROPONINI <0.03   ------------------------------------------------------------------------------------------------------------------  RADIOLOGY:  Dg Chest 2 View  08/25/2015  CLINICAL DATA:  Worsening cough and shortness of breath. EXAM: CHEST  2 VIEW COMPARISON:  09/07/2012 FINDINGS: The cardiac silhouette is enlarged. Mediastinal contours appear intact. There is no evidence of focal airspace consolidation, pleural effusion or pneumothorax. There is pulmonary vascular congestion. Osseous structures are without acute abnormality. Soft tissues are grossly normal. IMPRESSION: Enlarged cardiac silhouette with pulmonary vascular congestion. No evidence of focal airspace consolidation. Electronically Signed   By: Fidela Salisbury M.D.   On: 08/25/2015 17:14    EKG:   Orders placed or performed during the hospital encounter of 08/25/15  . EKG 12-Lead  . EKG 12-Lead  . ED EKG  . ED EKG  ASSESSMENT AND PLAN:   #1 acute on chronic diastolic CHF. decrease IV Lasix to 20 mg twice a day due  to low BP. -Continue aspirin, check lipid panel  #2 CK D-stage III at baseline. Worsening creatinine from 2014. No recent baseline available. BUN is about the same. Could be progression of her renal disease. -Continue to monitor carefully especially while on Lasix.  #3 diabetes mellitus. A1c 6.5, on sliding scale insulin. lantus 10 units HS.  #4 hypertension. Hold HTN meds.  #5 CAD-stable, continue ASA, troponin is negative  #6 COPD-on 2 L oxygen, noncompliant at home. Continue NEB.   All the records are reviewed and case discussed with Care Management/Social Workerr. Management plans discussed with the patient, family and they are in agreement.  CODE STATUS: full code.  TOTAL TIME TAKING CARE OF THIS PATIENT: 42 minutes.  Greater than 50% time was spent on coordination of care and face-to-face counseling.  POSSIBLE D/C IN  DAYS, DEPENDING ON CLINICAL CONDITION.   Demetrios Loll M.D on 08/26/2015 at 2:48 PM  Between 7am to 6pm - Pager - [phone]  After 6pm go to [email or web address] - password [email or web address] [facility]  [facility]  Office  [phone]  CC: Primary care physician; No primary care provider on file.

## 2015-08-26 NOTE — Progress Notes (Signed)
Agree with current treatment ,with normal EF and mild diastolic dysfunction on echo. Advise discharge with f/u next week in office.

## 2015-08-26 NOTE — Progress Notes (Signed)
Skin team checked with Georgian Co, RN

## 2015-08-26 NOTE — Care Management Note (Signed)
Case Management Note  Patient Details  Name: AVALISE GATLING MRN: [id number] Date of Birth: May 03, 1928  Subjective/Objective:   RNCM assessment on patient from [place]. TC to ALF and spoke with [facility]. She states patient is wheelchair bound but will occasionally use her walker. She has no home health or PT services. Patient has chronic Home O2 but often refuses to wear it. Admitted with CHF exacerbation. On IV lasix, O2 @ 3L.                  Action/Plan: CSW aware of admission. Will follow for discharge needs.   Expected Discharge Date:                  Expected Discharge Plan:  Assisted Living / Rest Home  In-House Referral:  Clinical Social Work  Discharge planning Services  CM Consult  Post Acute Care Choice:    Choice offered to:     DME Arranged:    DME Agency:     HH Arranged:    HH Agency:     Status of Service:  In process, will continue to follow  Medicare Important Message Given:    Date Medicare IM Given:    Medicare IM give by:    Date Additional Medicare IM Given:    Additional Medicare Important Message give by:     If discussed at [place] of Stay Meetings, dates discussed:    Additional Comments:  Jolly Mango, RN 08/26/2015, 1:50 PM

## 2015-08-26 NOTE — Consult Note (Signed)
Tina [name] is a 82 y.o. female  [id number]  Primary Cardiologist: Neoma Laming Reason for Consultation:Worsening shortness of breath and edema  HPI: The patient presented with increasing shortness of breath and non-productive cough. She lives in assisted living and relies on her daughters for transportation. She uses oxygen for advanced COPD and has stage III CKD. SHe denies any chest pain but has a rattling cough and is unable to cough anything up. She says that she has been feeling progressively more run down lately. SHe has a history of CAD with an MI at age 15, but she is unable to provide the particulars. SHe denies any further MI. She has had a cardiologist in the past but is not currently followed by cardiology. SHe does have a kidney doctor.   Review of Systems:  Constitutional: positive for fatigue and malaise Cardiovascular ROS: positive for - dyspnea on exertion, edema, orthopnea and shortness of breath negative for - chest pain, irregular heartbeat or loss of consciousness  Review of Systems - History obtained from chart review and the patient   Past Medical History  Diagnosis Date  . Diabetes mellitus without complication ([place])   . Renal disorder   . Anxiety   . Osteoarthritis   . Anemia   . Bronchitis   . CKD stage 3 due to type 2 diabetes mellitus ([place])   . Obesity   . CAD (coronary artery disease)     s/p PCI  . COPD (chronic obstructive pulmonary disease) ([facility])     on 2L home o2  . Colon cancer ([place])     treated, surgery, chemo, radiation    Medications Prior to Admission  Medication Sig Dispense Refill  . acetaminophen (TYLENOL) 500 MG tablet Take 500 mg by mouth every 4 (four) hours as needed for mild pain, fever or headache.    . albuterol (PROVENTIL HFA;VENTOLIN HFA) 108 (90 Base) MCG/ACT inhaler Inhale 2 puffs into the lungs every 4 (four) hours as needed for wheezing or shortness of breath.    Marland Kitchen alum & mag hydroxide-simeth (MAALOX PLUS) 400-400-40  MG/5ML suspension Take 30 mLs by mouth every 6 (six) hours as needed for indigestion.    Marland Kitchen aspirin 81 MG chewable tablet Chew 81 mg by mouth daily.    . calcium carbonate (TUMS - DOSED IN MG ELEMENTAL CALCIUM) 500 MG chewable tablet Chew 1 tablet by mouth 2 (two) times daily.    . cetaphil (CETAPHIL) lotion Apply 1 application topically as needed for dry skin.    . clotrimazole (LOTRIMIN) 1 % cream Apply 1 application topically as needed (for rash under both breasts and left groin).    Marland Kitchen docusate sodium (COLACE) 100 MG capsule Take 100 mg by mouth 2 (two) times daily.    . ferrous sulfate 325 (65 FE) MG tablet Take 325 mg by mouth daily at 12 noon.    . Fluticasone-Salmeterol (ADVAIR) 250-50 MCG/DOSE AEPB Inhale 1 puff into the lungs 2 (two) times daily.    . furosemide (LASIX) 20 MG tablet Take 20 mg by mouth 2 (two) times daily.    Marland Kitchen guaiFENesin (MUCINEX) 600 MG 12 hr tablet Take 600 mg by mouth 2 (two) times daily.    Marland Kitchen guaifenesin (ROBITUSSIN) 100 MG/5ML syrup Take 200 mg by mouth every 6 (six) hours as needed for cough.    . insulin glargine (LANTUS) 100 UNIT/ML injection Inject 10 Units into the skin at bedtime.    . insulin regular (NOVOLIN R,HUMULIN R)  100 units/mL injection Inject 0-10 Units into the skin 4 (four) times daily as needed for high blood sugar. Pt uses as needed per sliding scale:    0-200:  0 units  201-250:  3 units  251-300:  5 units  301-350:  7 units  351-400:  10 units  Greater than 400 or less than 60:  Call MD    . ipratropium-albuterol (DUONEB) 0.5-2.5 (3) MG/3ML SOLN Take 3 mLs by nebulization every 6 (six) hours as needed (for wheezing/shortness of breath).    . lactulose, encephalopathy, (CHRONULAC) 10 GM/15ML SOLN Take 20 g by mouth daily.    Marland Kitchen levothyroxine (SYNTHROID, LEVOTHROID) 112 MCG tablet Take 112 mcg by mouth daily before breakfast.    . lisinopril (PRINIVIL,ZESTRIL) 5 MG tablet Take 5 mg by mouth daily.    Marland Kitchen loperamide (IMODIUM) 2 MG capsule Take 2  mg by mouth as needed for diarrhea or loose stools.    Marland Kitchen LORazepam (ATIVAN) 0.5 MG tablet Take 0.5 mg by mouth every 8 (eight) hours as needed (for agitation).    . magnesium hydroxide (MILK OF MAGNESIA) 400 MG/5ML suspension Take 30 mLs by mouth at bedtime as needed for mild constipation.    . metoprolol tartrate (LOPRESSOR) 25 MG tablet Take 25 mg by mouth 2 (two) times daily.    Marland Kitchen neomycin-bacitracin-polymyxin (NEOSPORIN) [phone] ointment Apply 1 application topically as needed (for wound care).    . nystatin (MYCOSTATIN/NYSTOP) 100000 UNIT/GM POWD Apply 1 g topically 2 (two) times daily as needed (for dry skin).    Marland Kitchen omeprazole (PRILOSEC) 20 MG capsule Take 20 mg by mouth daily.    . polyvinyl alcohol (LIQUIFILM TEARS) 1.4 % ophthalmic solution Place 1 drop into both eyes 2 (two) times daily.    . simethicone (MYLICON) 80 MG chewable tablet Chew 80 mg by mouth every 8 (eight) hours.    . Skin Protectants, Misc. (BAZA PROTECT EX) Apply 1 application topically 2 (two) times daily as needed (for redness or open wounds).    . sucralfate (CARAFATE) 1 g tablet Take 1 g by mouth 4 (four) times daily -  with meals and at bedtime.    . Vitamin D, Ergocalciferol, (DRISDOL) 50000 units CAPS capsule Take 50,000 Units by mouth every 7 (seven) days. Pt takes on Tuesday.       Marland Kitchen aspirin  81 mg Oral Daily  . calcium carbonate  1 tablet Oral BID  . docusate sodium  100 mg Oral BID  . docusate sodium  100 mg Oral BID  . enoxaparin (LOVENOX) injection  30 mg Subcutaneous Q24H  . ferrous sulfate  325 mg Oral Q1200  . furosemide  40 mg Intravenous BID  . guaiFENesin  600 mg Oral BID  . insulin aspart  0-5 Units Subcutaneous QHS  . insulin aspart  0-9 Units Subcutaneous TID WC  . insulin glargine  10 Units Subcutaneous QHS  . lactulose  20 g Oral Daily  . levothyroxine  112 mcg Oral QAC breakfast  . lisinopril  5 mg Oral Daily  . metoprolol tartrate  25 mg Oral BID  . mometasone-formoterol  2 puff  Inhalation BID  . pantoprazole  40 mg Oral Daily  . polyvinyl alcohol  1 drop Both Eyes BID  . sodium chloride flush  3 mL Intravenous Q12H  . sucralfate  1 g Oral TID WC & HS  . Vitamin D (Ergocalciferol)  50,000 Units Oral Q Tue    Infusions:    Allergies  Allergen Reactions  . Corn-Containing Products Other (See Comments)    Reaction:  Unknown     Social History   Social History  . Marital Status: Divorced    Spouse Name: N/A  . Number of Children: N/A  . Years of Education: N/A   Occupational History  . Not on file.   Social History Main Topics  . Smoking status: Former Research scientist (life sciences)  . Smokeless tobacco: Not on file     Comment: quit 20 years ago  . Alcohol Use: No  . Drug Use: No  . Sexual Activity: Not on file   Other Topics Concern  . Not on file   Social History Narrative   Lives at [facility] assisted living   Wheel chair bound       Family History  Problem Relation Age of Onset  . Diabetes Mellitus II Mother     PHYSICAL EXAM: Filed Vitals:   08/26/15 1042 08/26/15 1049  BP: 113/35 155/138  Pulse: 68 71  Temp:    Resp:       Intake/Output Summary (Last 24 hours) at 08/26/15 1127 Last data filed at 08/26/15 0830  Gross per 24 hour  Intake    460 ml  Output   2025 ml  Net  -1565 ml    General:  Ill appearing with frequent rattling cough. HEENT: normal Neck: supple. no JVD. Carotids 2+ bilat; no bruits. No lymphadenopathy or thryomegaly appreciated. Cor: Regular rate & rhythm. No rubs, gallops or murmurs. Lungs: Coarse exp wheezes bilat, coarse crackles in bases Abdomen: soft, nontender, nondistended. No hepatosplenomegaly. No bruits or masses. Good bowel sounds. Extremities: no cyanosis, clubbing, rash, 2+ lower edema Neuro: alert & oriented x 3, cranial nerves grossly intact. moves all 4 extremities w/o difficulty. Affect slightly blunted.  ECG: Sinus rhythm with PACs, 69 bpm. Incomplete R BBB, possible old septal MI, non-specific  ST,T changes. No new findings.  Tele:  SR at 65 bpm, no ectopy  Results for orders placed or performed during the hospital encounter of 08/25/15 (from the past 24 hour(s))  Comprehensive metabolic panel     Status: Abnormal   Collection Time: 08/25/15  4:36 PM  Result Value Ref Range   Sodium 131 (L) 135 - 145 mmol/L   Potassium 4.6 3.5 - 5.1 mmol/L   Chloride 97 (L) 101 - 111 mmol/L   CO2 29 22 - 32 mmol/L   Glucose, Bld 128 (H) 65 - 99 mg/dL   BUN 29 (H) 6 - 20 mg/dL   Creatinine, Ser 1.64 (H) 0.44 - 1.00 mg/dL   Calcium 8.9 8.9 - 10.3 mg/dL   Total Protein 6.6 6.5 - 8.1 g/dL   Albumin 3.1 (L) 3.5 - 5.0 g/dL   AST 18 15 - 41 U/L   ALT 13 (L) 14 - 54 U/L   Alkaline Phosphatase 74 38 - 126 U/L   Total Bilirubin 0.7 0.3 - 1.2 mg/dL   GFR calc non Af Amer 27 (L) >60 mL/min   GFR calc Af Amer 32 (L) >60 mL/min   Anion gap 5 5 - 15  CBC with Differential     Status: Abnormal   Collection Time: 08/25/15  4:36 PM  Result Value Ref Range   WBC 10.1 3.6 - 11.0 K/uL   RBC 4.31 3.80 - 5.20 MIL/uL   Hemoglobin 12.4 12.0 - 16.0 g/dL   HCT 38.5 35.0 - 47.0 %   MCV 89.5 80.0 - 100.0 fL   MCH 28.9 26.0 -  34.0 pg   MCHC 32.3 32.0 - 36.0 g/dL   RDW 14.8 (H) 11.5 - 14.5 %   Platelets 253 150 - 440 K/uL   Neutrophils Relative % 75 %   Neutro Abs 7.7 (H) 1.4 - 6.5 K/uL   Lymphocytes Relative 16 %   Lymphs Abs 1.6 1.0 - 3.6 K/uL   Monocytes Relative 6 %   Monocytes Absolute 0.6 0.2 - 0.9 K/uL   Eosinophils Relative 2 %   Eosinophils Absolute 0.2 0 - 0.7 K/uL   Basophils Relative 1 %   Basophils Absolute 0.1 0 - 0.1 K/uL  Troponin I     Status: None   Collection Time: 08/25/15  4:36 PM  Result Value Ref Range   Troponin I <0.03 <0.031 ng/mL  Lactic acid, plasma     Status: None   Collection Time: 08/25/15  4:37 PM  Result Value Ref Range   Lactic Acid, Venous 1.1 0.5 - 2.0 mmol/L  Urinalysis complete, with microscopic     Status: Abnormal   Collection Time: 08/25/15  5:29 PM   Result Value Ref Range   Color, Urine STRAW (A) YELLOW   APPearance CLEAR (A) CLEAR   Glucose, UA NEGATIVE NEGATIVE mg/dL   Bilirubin Urine NEGATIVE NEGATIVE   Ketones, ur NEGATIVE NEGATIVE mg/dL   Specific Gravity, Urine 1.004 (L) 1.005 - 1.030   Hgb urine dipstick NEGATIVE NEGATIVE   pH 5.0 5.0 - 8.0   Protein, ur NEGATIVE NEGATIVE mg/dL   Nitrite NEGATIVE NEGATIVE   Leukocytes, UA NEGATIVE NEGATIVE   RBC / HPF NONE SEEN 0 - 5 RBC/hpf   WBC, UA 0-5 0 - 5 WBC/hpf   Bacteria, UA RARE (A) NONE SEEN   Squamous Epithelial / LPF 0-5 (A) NONE SEEN   Hyaline Casts, UA PRESENT   Lactic acid, plasma     Status: None   Collection Time: 08/25/15  9:41 PM  Result Value Ref Range   Lactic Acid, Venous 1.0 0.5 - 2.0 mmol/L  Troponin I     Status: None   Collection Time: 08/25/15  9:41 PM  Result Value Ref Range   Troponin I 0.03 <0.031 ng/mL  Glucose, capillary     Status: Abnormal   Collection Time: 08/25/15  9:59 PM  Result Value Ref Range   Glucose-Capillary 145 (H) 65 - 99 mg/dL   Comment 1 Notify RN    Comment 2 Document in Chart   Basic metabolic panel     Status: Abnormal   Collection Time: 08/26/15  6:05 AM  Result Value Ref Range   Sodium 137 135 - 145 mmol/L   Potassium 4.2 3.5 - 5.1 mmol/L   Chloride 98 (L) 101 - 111 mmol/L   CO2 34 (H) 22 - 32 mmol/L   Glucose, Bld 110 (H) 65 - 99 mg/dL   BUN 28 (H) 6 - 20 mg/dL   Creatinine, Ser 1.74 (H) 0.44 - 1.00 mg/dL   Calcium 8.9 8.9 - 10.3 mg/dL   GFR calc non Af Amer 26 (L) >60 mL/min   GFR calc Af Amer 30 (L) >60 mL/min   Anion gap 5 5 - 15  CBC     Status: Abnormal   Collection Time: 08/26/15  6:05 AM  Result Value Ref Range   WBC 7.9 3.6 - 11.0 K/uL   RBC 4.12 3.80 - 5.20 MIL/uL   Hemoglobin 11.9 (L) 12.0 - 16.0 g/dL   HCT 36.9 35.0 - 47.0 %   MCV 89.6  80.0 - 100.0 fL   MCH 28.8 26.0 - 34.0 pg   MCHC 32.2 32.0 - 36.0 g/dL   RDW 14.7 (H) 11.5 - 14.5 %   Platelets 245 150 - 440 K/uL  Troponin I     Status: None    Collection Time: 08/26/15  6:05 AM  Result Value Ref Range   Troponin I <0.03 <0.031 ng/mL  Glucose, capillary     Status: Abnormal   Collection Time: 08/26/15  7:37 AM  Result Value Ref Range   Glucose-Capillary 115 (H) 65 - 99 mg/dL   Comment 1 Notify RN    Dg Chest 2 View  08/25/2015  CLINICAL DATA:  Worsening cough and shortness of breath. EXAM: CHEST  2 VIEW COMPARISON:  09/07/2012 FINDINGS: The cardiac silhouette is enlarged. Mediastinal contours appear intact. There is no evidence of focal airspace consolidation, pleural effusion or pneumothorax. There is pulmonary vascular congestion. Osseous structures are without acute abnormality. Soft tissues are grossly normal. IMPRESSION: Enlarged cardiac silhouette with pulmonary vascular congestion. No evidence of focal airspace consolidation. Electronically Signed   By: Fidela Salisbury M.D.   On: 08/25/2015 17:14     ASSESSMENT AND PLAN: Pt admitted with CHF exacerbation. History of stage III CKD, COPD with home oxygen and diabetes. SHe has coarse breath sounds, chest xray without infiltrates, Normal WBCs, 2+ lower extremity edema. Troponins have been negative X 3. SHe is being gently diuresed, paying close attention to kidney function. Last echo in 2014 showed normal EF 60%. Recheck Echo.   Pt agrees to follow up after discharge with cardiology so that she can be better managed as an outpatient with encouragement of medication and treatment compliance.   Daune Perch, NP-C

## 2015-08-26 NOTE — Clinical Social Work Note (Signed)
Clinical Social Work Assessment  Patient Details  Name: Tina [name] MRN: [id number] Date of Birth: 1927-08-03  Date of referral:  08/26/15               Reason for consult:  Discharge Planning                Permission sought to share information with:  Facility Sport and exercise psychologist ([facility] ) Permission granted to share information::  Yes, Verbal Permission Granted  Name::        Agency::     Relationship::   [name] [phone])  Contact Information:     Housing/Transportation Living arrangements for the past 2 months:  [place] ([facility] ) Source of Information:  Patient, Other (Comment Required) ([facility] ) Patient Interpreter Needed:  None Criminal Activity/Legal Involvement Pertinent to Current Situation/Hospitalization:  No - Comment as needed Significant Relationships:  Adult Children ([name] (Son) ) Lives with:  Facility Resident ([facility] ) Do you feel safe going back to the place where you live?  Yes Need for family participation in patient care:  No (Coment)  Care giving concerns:  Patient is from [facility].    Social Worker assessment / plan:  CSW was consulted because patient is from [place]. CSW met with patient at bedside. Patient was alert, oriented and sitting up in her chair. CSW explained he role. Per Patient she is from [place]. She reports that she does not know how long she's been living there. Patient reports that she sold her house some years ago. She stated that she enjoys living at [place]. She stated that she typically uses a wheelchair. She stated that sometimes she walks with a walker. Per patient she wears O2 "but only when I need it". Patient gave CSW verbal permission to contact her son [name] or daughter [name]. Patient reports that she does not have Tracy's number.Patient reports that she will return to [facility] via EMS. Verbal Permission granted to contact [facility].   CSW contacted [facility]. Per RN at [facility] patient uses a wheelchair but occasionally uses a walker. She stated that patient has O2 but does not use it daily. She reports that patient can return at discharge.   Employment status:  Retired Forensic scientist:  Medicaid In [place], [place] PT Recommendations:  Not assessed at this time Information / Referral to community resources:     Patient/Family's Response to care:  Patient is in agreement with returning to [facility] via EMS.   Patient/Family's Understanding of and Emotional Response to Diagnosis, Current Treatment, and Prognosis:  Patient understands CSW's role. Patient was pleasant and was appreciative for CSW's assistance.  Emotional Assessment Appearance:  Appears stated age Attitude/Demeanor/Rapport:   (None ) Affect (typically observed):  Accepting, Calm, Pleasant Orientation:  Oriented to Self, Oriented to Place, Oriented to  Time, Oriented to Situation Alcohol / Substance use:  Not Applicable Psych involvement (Current and /or in the community):  No (Comment)  Discharge Needs  Concerns to be addressed:  Discharge Planning Concerns Readmission within the last 30 days:  No Current discharge risk:  Chronically ill Barriers to Discharge:  Continued Medical Work up   [employer], LCSW 08/26/2015, 5:42 PM

## 2015-08-26 NOTE — Progress Notes (Signed)
*  PRELIMINARY RESULTS* Echocardiogram 2D Echocardiogram has been performed.  Tina [name] 08/26/2015, 10:20 AM

## 2015-08-26 NOTE — NC FL2 (Signed)
[place] LEVEL OF CARE SCREENING TOOL     IDENTIFICATION  Patient Name: Tina [name] Birthdate: [birth year] Sex: female Admission Date (Current Location): 08/25/2015  [facility] and [place] Number:  Tina [name]  ([id number]) Facility and Address:  [facility], [address] [place], [place] [address]      Provider Number: [id number]  Attending Physician Name and Address:  Demetrios Loll, MD  Relative Name and Phone Number:       Current Level of Care: Hospital Recommended Level of Care: [place] Prior Approval Number:    Date Approved/Denied:   PASRR Number:  ([id number])  Discharge Plan: Domiciliary (Rest home) ([facility] )    Current Diagnoses: Patient Active Problem List   Diagnosis Date Noted  . Pressure ulcer 08/26/2015  . CHF (congestive heart failure) ([place]) 08/25/2015    Orientation RESPIRATION BLADDER Height & Weight     Self, Situation, Time, Place  O2 (Nasal Cannula 3 (L/min) ) Continent Weight: 227 lb 11.2 oz (103.284 kg) Height:  5\' 7"  (170.2 cm)  BEHAVIORAL SYMPTOMS/MOOD NEUROLOGICAL BOWEL NUTRITION STATUS   (None )  (None ) Continent Diet (heart healthy/carb modified )  AMBULATORY STATUS COMMUNICATION OF NEEDS Skin   Limited Assist Verbally Other (Comment) (Pressure Ulcer Stage I Bilateral Buttocks )                       Personal Care Assistance Level of Assistance  Bathing, Feeding, Dressing Bathing Assistance: Limited assistance Feeding assistance: Independent Dressing Assistance: Limited assistance     Functional Limitations Info  Hearing, Speech, Sight Sight Info: Impaired (Patient wears prescription glasses) Hearing Info: Adequate Speech Info: Adequate    SPECIAL CARE FACTORS FREQUENCY                       Contractures      Additional Factors Info  Code Status, Allergies, Insulin Sliding Scale Code Status Info:  (Full Code ) Allergies Info:  (Corn-containing  Products)   Insulin Sliding Scale Info:  (insulin aspart (novoLOG) injection 0-5 Units- daily at bedtime & insulin aspart (novoLOG) injection 0-9 Units- 3 times daily with meals )       Current Medications (08/26/2015):  This is the current hospital active medication list Current Facility-Administered Medications  Medication Dose Route Frequency Provider Last Rate Last Dose  . acetaminophen (TYLENOL) tablet 650 mg  650 mg Oral Q6H PRN Gladstone Lighter, MD   650 mg at 08/26/15 1147   Or  . acetaminophen (TYLENOL) suppository 650 mg  650 mg Rectal Q6H PRN Gladstone Lighter, MD      . albuterol (PROVENTIL) (2.5 MG/3ML) 0.083% nebulizer solution 3 mL  3 mL Inhalation Q4H PRN Gladstone Lighter, MD      . alum & mag hydroxide-simeth (MAALOX/MYLANTA) 200-200-20 MG/5ML suspension 30 mL  30 mL Oral Q6H PRN Gladstone Lighter, MD      . aspirin chewable tablet 81 mg  81 mg Oral Daily Gladstone Lighter, MD   81 mg at 08/26/15 1041  . calcium carbonate (TUMS - dosed in mg elemental calcium) chewable tablet 200 mg of elemental calcium  1 tablet Oral BID Gladstone Lighter, MD   200 mg of elemental calcium at 08/26/15 1041  . clotrimazole (LOTRIMIN) 1 % cream 1 application  1 application Topical PRN Gladstone Lighter, MD      . docusate sodium (COLACE) capsule 100 mg  100 mg Oral BID Gladstone Lighter, MD  100 mg at 08/26/15 1041  . enoxaparin (LOVENOX) injection 30 mg  30 mg Subcutaneous Q24H Gladstone Lighter, MD   30 mg at 08/25/15 2211  . ferrous sulfate tablet 325 mg  325 mg Oral Q1200 Gladstone Lighter, MD   325 mg at 08/26/15 1217  . furosemide (LASIX) injection 20 mg  20 mg Intravenous BID Demetrios Loll, MD      . guaiFENesin [facility]) 12 hr tablet 600 mg  600 mg Oral BID Gladstone Lighter, MD   600 mg at 08/26/15 1041  . guaifenesin (ROBITUSSIN) 100 MG/5ML syrup 200 mg  200 mg Oral Q6H PRN Gladstone Lighter, MD   200 mg at 08/25/15 2210  . insulin aspart (novoLOG) injection 0-5 Units  0-5 Units  Subcutaneous QHS Gladstone Lighter, MD   0 Units at 08/25/15 2220  . insulin aspart (novoLOG) injection 0-9 Units  0-9 Units Subcutaneous TID WC Gladstone Lighter, MD   1 Units at 08/26/15 1218  . insulin glargine (LANTUS) injection 10 Units  10 Units Subcutaneous QHS Gladstone Lighter, MD   10 Units at 08/25/15 2210  . lactulose (CHRONULAC) 10 GM/15ML solution 20 g  20 g Oral Daily Gladstone Lighter, MD   20 g at 08/25/15 2210  . levothyroxine (SYNTHROID, LEVOTHROID) tablet 112 mcg  112 mcg Oral QAC breakfast Gladstone Lighter, MD   112 mcg at 08/26/15 0856  . LORazepam (ATIVAN) tablet 0.5 mg  0.5 mg Oral Q8H PRN Gladstone Lighter, MD   0.5 mg at 08/26/15 0447  . magnesium hydroxide (MILK OF MAGNESIA) suspension 30 mL  30 mL Oral QHS PRN Gladstone Lighter, MD      . mometasone-formoterol (DULERA) 100-5 MCG/ACT inhaler 2 puff  2 puff Inhalation BID Gladstone Lighter, MD   2 puff at 08/26/15 0856  . neomycin-bacitracin-polymyxin (NEOSPORIN) ointment 1 application  1 application Topical PRN Gladstone Lighter, MD      . ondansetron (ZOFRAN) tablet 4 mg  4 mg Oral Q6H PRN Gladstone Lighter, MD       Or  . ondansetron (ZOFRAN) injection 4 mg  4 mg Intravenous Q6H PRN Gladstone Lighter, MD      . pantoprazole (PROTONIX) EC tablet 40 mg  40 mg Oral Daily Gladstone Lighter, MD   40 mg at 08/26/15 1041  . polyethylene glycol (MIRALAX / GLYCOLAX) packet 17 g  17 g Oral Daily PRN Gladstone Lighter, MD      . polyvinyl alcohol (LIQUIFILM TEARS) 1.4 % ophthalmic solution 1 drop  1 drop Both Eyes BID Gladstone Lighter, MD   1 drop at 08/26/15 1043  . sodium chloride flush (NS) 0.9 % injection 3 mL  3 mL Intravenous Q12H Gladstone Lighter, MD   3 mL at 08/26/15 1042  . sucralfate (CARAFATE) tablet 1 g  1 g Oral TID WC & HS Gladstone Lighter, MD   1 g at 08/26/15 1217  . Vitamin D (Ergocalciferol) (DRISDOL) capsule 50,000 Units  50,000 Units Oral Q Otho Bellows, MD   50,000 Units at 08/25/15 2211      Discharge Medications: Please see discharge summary for a list of discharge medications.  Relevant Imaging Results:  Relevant Lab Results:   Additional Information  (SSN [id number])  Lorenso Quarry Mirabella Hilario, LCSW

## 2015-08-27 ENCOUNTER — Inpatient Hospital Stay: Payer: Medicare Other

## 2015-08-27 LAB — GLUCOSE, CAPILLARY
GLUCOSE-CAPILLARY: 108 mg/dL — AB (ref 65–99)
GLUCOSE-CAPILLARY: 121 mg/dL — AB (ref 65–99)
Glucose-Capillary: 113 mg/dL — ABNORMAL HIGH (ref 65–99)

## 2015-08-27 LAB — BASIC METABOLIC PANEL
Anion gap: 4 — ABNORMAL LOW (ref 5–15)
BUN: 30 mg/dL — AB (ref 6–20)
CALCIUM: 9.2 mg/dL (ref 8.9–10.3)
CHLORIDE: 97 mmol/L — AB (ref 101–111)
CO2: 34 mmol/L — AB (ref 22–32)
CREATININE: 1.69 mg/dL — AB (ref 0.44–1.00)
GFR calc non Af Amer: 26 mL/min — ABNORMAL LOW (ref >60)
GFR, EST AFRICAN AMERICAN: 31 mL/min — AB (ref >60)
Glucose, Bld: 119 mg/dL — ABNORMAL HIGH (ref 65–99)
Potassium: 4.4 mmol/L (ref 3.5–5.1)
Sodium: 135 mmol/L (ref 135–145)

## 2015-08-27 LAB — MAGNESIUM: MAGNESIUM: 2 mg/dL (ref 1.7–2.4)

## 2015-08-27 NOTE — Care Management Important Message (Signed)
Important Message  Patient Details  Name: Tina [name] MRN: [id number] Date of Birth: 1927/11/02   Medicare Important Message Given:  Yes    [name] 08/27/2015, 10:19 AM

## 2015-08-27 NOTE — Care Management (Signed)
Patient is to discharge back to [facility].  Referral made to [facility] for home health nursing for follow up of chf.  Referral called to Tim with [facility].

## 2015-08-27 NOTE — Care Management Important Message (Signed)
Important Message  Patient Details  Name: Tina [name] MRN: [id number] Date of Birth: May 05, 1928   Medicare Important Message Given:  Yes    Juliann Pulse A Dreon Pineda 08/27/2015, 10:20 AM

## 2015-08-27 NOTE — Progress Notes (Signed)
SUBJECTIVE: I saw pt this morning while she was eating breakfast sitting on the side of the bed. She stated that she did not have a good night, but cannot relay any specific complaint. The nursing note says that she rested well.    Filed Vitals:   08/26/15 1813 08/26/15 2130 08/27/15 0425 08/27/15 0800  BP: 111/42 88/40 113/63 97/48  Pulse: 55 61 67 77  Temp:  97.4 F (36.3 C) 97.8 F (36.6 C) 98.5 F (36.9 C)  TempSrc:  Oral Oral Oral  Resp:  18 18 20   Height:      Weight:      SpO2:  94% 100% 95%    Intake/Output Summary (Last 24 hours) at 08/27/15 1610 Last data filed at 08/27/15 1452  Gross per 24 hour  Intake    120 ml  Output   1900 ml  Net  -1780 ml    LABS: Basic Metabolic Panel:  Recent Labs  08/26/15 0605 08/27/15 0456  NA 137 135  K 4.2 4.4  CL 98* 97*  CO2 34* 34*  GLUCOSE 110* 119*  BUN 28* 30*  CREATININE 1.74* 1.69*  CALCIUM 8.9 9.2  MG  --  2.0   Liver Function Tests:  Recent Labs  08/25/15 1636  AST 18  ALT 13*  ALKPHOS 74  BILITOT 0.7  PROT 6.6  ALBUMIN 3.1*   No results for input(s): LIPASE, AMYLASE in the last 72 hours. CBC:  Recent Labs  08/25/15 1636 08/26/15 0605  WBC 10.1 7.9  NEUTROABS 7.7*  --   HGB 12.4 11.9*  HCT 38.5 36.9  MCV 89.5 89.6  PLT 253 245   Cardiac Enzymes:  Recent Labs  08/25/15 2141 08/26/15 0605 08/26/15 1213  TROPONINI 0.03 <0.03 <0.03   BNP: Invalid input(s): POCBNP D-Dimer: No results for input(s): DDIMER in the last 72 hours. Hemoglobin A1C:  Recent Labs  08/25/15 2141  HGBA1C 6.5*   Fasting Lipid Panel: No results for input(s): CHOL, HDL, LDLCALC, TRIG, CHOLHDL, LDLDIRECT in the last 72 hours. Thyroid Function Tests: No results for input(s): TSH, T4TOTAL, T3FREE, THYROIDAB in the last 72 hours.  Invalid input(s): FREET3 Anemia Panel: No results for input(s): VITAMINB12, FOLATE, FERRITIN, TIBC, IRON, RETICCTPCT in the last 72 hours.   PHYSICAL EXAM General: Well  developed, well nourished, in no acute distress HEENT:  Normocephalic and atramatic Neck:  No JVD.  Lungs: Clear bilaterally to auscultation and percussion. Heart: HRRR . Normal S1 and S2 without gallops or murmurs.  Abdomen: Bowel sounds are positive, abdomen soft and non-tender  Msk:  Back normal, normal gait. Normal strength and tone for age. Extremities: No clubbing, cyanosis or edema.   Neuro: Alert and oriented X 3. Psych:  Good affect, responds appropriately  TELEMETRY: SR in 70's  ASSESSMENT AND PLAN: Pt with acute CHF exacerbation. Her echo shows normal systolic function with EF 60-65% and normal wall motion, but could have diastolic dysfunction. She has improved with diuresis, but has intermittent low blood pressures. Med adjustments will be made by hospitalist at discharge. She can follow up with [place] in the office.   Active Problems:   CHF (congestive heart failure) ([place])   Pressure ulcer    Daune Perch, NP-C 08/27/2015 4:10 PM

## 2015-08-27 NOTE — Progress Notes (Signed)
Pt discharged to [facility] via EMS.  No complaints of discomfort or pain.   IV d/ced, tele removed and no complaints of pain or discomfort.

## 2015-08-27 NOTE — Discharge Instructions (Signed)
Heart Failure Clinic appointment on September 14, 2015 at 9:00am with Darylene Price, [place]. Please call [phone] to reschedule.

## 2015-08-27 NOTE — Progress Notes (Signed)
Patient rested quietly tonight. No complaints of pain and no signs of discomfort or distress noted. VSS and A&Ox4. Nursing staff will continue to monitor. Earleen Reaper, RN

## 2015-08-27 NOTE — Progress Notes (Addendum)
Clinical Social Worker was informed thatt patient will be medically ready to discharge to [facility]. Patient is in a agreement with plan. CSW called [facility] to confirm that patient's bed is ready. Per [place] patient can return today via EMS. She reports that [facility] services are provided through [place]. Informed RN Case Manager of above. She well establish [place] services with [place]. All discharge information faxed to [place] via [place]. Rx's added to discharge packet.   Call to patient's son [name], left message to inform him patient would discharge to [facility]. RN will arrange EMS transport.   Ernest Pine, MSW, [place] Social Work Department [phone]

## 2015-08-27 NOTE — Discharge Summary (Signed)
Tina [name] at Tina [name]    MR#:  [id number]  DATE OF [name]:  August 30, 1927  DATE OF ADMISSION:  08/25/2015 ADMITTING PHYSICIAN: Tina Lighter, MD  DATE OF DISCHARGE: 08/27/15  PRIMARY CARE PHYSICIAN: No primary care provider on file.    ADMISSION DIAGNOSIS:  Hypoxia [R09.02] Acute on chronic congestive heart failure, unspecified congestive heart failure type ([place]) [I50.9]  DISCHARGE DIAGNOSIS:  Active Problems:   CHF (congestive heart failure) (H[facility])   Pressure ulcer   SECONDARY DIAGNOSIS:   Past Medical History  Diagnosis Date  . Diabetes mellitus without complication ([place])   . Renal disorder   . Anxiety   . Osteoarthritis   . Anemia   . Bronchitis   . CKD stage 3 due to type 2 diabetes mellitus ([place])   . Obesity   . CAD (coronary artery disease)     s/p PCI  . COPD (chronic obstructive pulmonary disease) (H[facility])     on 2L home o2  . Colon cancer ([place])     treated, surgery, chemo, radiation    HOSPITAL COURSE:   Tina [name] is a 82 y.o. female with a known history of obesity, CK D stage III, diabetes mellitus, hypertension, CAD, COPD on home oxygen, history of colon cancer in remission who is from [facility] assisted living facility was brought in secondary to worsening shortness of breath.  #1 acute CHF exacerbation- acute on chronic diastolic dysfunction - Improved with IV lasix- change to oral bid. -Last cardiac catheterization in 2014 showed EF of 60%. Likely has diastolic dysfunction. -Appreciate cardiology consult - continue cardiac meds. - ECHO with normal EF  #2 CKD-stage III at baseline. Worsening creatinine from 2014.  -No recent baseline available. Could be progression of her renal disease.  #3 diabetes mellitus- A1c 6.5 and restart home meds at discharge.  #4 hypertension.-hold metoprolol and lisinopril at discharge due to low normal BP.  #5 CAD-stable at this time. Denies any  chest pain. -Troponin is negative - outpatient follow up recommended  #6 COPD-on 2 L oxygen, noncompliant at home. Restarted her inhalers. Also nebs  Discharge back to [facility] today with Home health Wheelchair bound at baseline.  DISCHARGE CONDITIONS:   Stable  CONSULTS OBTAINED:  Treatment Team:  Dionisio David, MD  DRUG ALLERGIES:   Allergies  Allergen Reactions  . [place]-Containing Products Other (See Comments)    Reaction:  Unknown     DISCHARGE MEDICATIONS:   Current Discharge Medication List    CONTINUE these medications which have NOT CHANGED   Details  acetaminophen (TYLENOL) 500 MG tablet Take 500 mg by mouth every 4 (four) hours as needed for mild pain, fever or headache.    albuterol (PROVENTIL HFA;VENTOLIN HFA) 108 (90 Base) MCG/ACT inhaler Inhale 2 puffs into the lungs every 4 (four) hours as needed for wheezing or shortness of breath.    alum & mag hydroxide-simeth (MAALOX PLUS) 400-400-40 MG/5ML suspension Take 30 mLs by mouth every 6 (six) hours as needed for indigestion.    aspirin 81 MG chewable tablet Chew 81 mg by mouth daily.    calcium carbonate (TUMS - DOSED IN MG ELEMENTAL CALCIUM) 500 MG chewable tablet Chew 1 tablet by mouth 2 (two) times daily.    cetaphil (CETAPHIL) lotion Apply 1 application topically as needed for dry skin.    clotrimazole (LOTRIMIN) 1 % cream Apply 1 application topically as needed (for rash under both breasts and left  groin).    docusate sodium (COLACE) 100 MG capsule Take 100 mg by mouth 2 (two) times daily.    ferrous sulfate 325 (65 FE) MG tablet Take 325 mg by mouth daily at 12 noon.    Fluticasone-Salmeterol (ADVAIR) 250-50 MCG/DOSE AEPB Inhale 1 puff into the lungs 2 (two) times daily.    furosemide (LASIX) 20 MG tablet Take 20 mg by mouth 2 (two) times daily.    guaiFENesin (MUCINEX) 600 MG 12 hr tablet Take 600 mg by mouth 2 (two) times daily.    guaifenesin (ROBITUSSIN) 100 MG/5ML syrup Take 200 mg by  mouth every 6 (six) hours as needed for cough.    insulin glargine (LANTUS) 100 UNIT/ML injection Inject 10 Units into the skin at bedtime.    insulin regular (NOVOLIN R,HUMULIN R) 100 units/mL injection Inject 0-10 Units into the skin 4 (four) times daily as needed for high blood sugar. Pt uses as needed per sliding scale:    0-200:  0 units  201-250:  3 units  251-300:  5 units  301-350:  7 units  351-400:  10 units  Greater than 400 or less than 60:  Call MD    ipratropium-albuterol (DUONEB) 0.5-2.5 (3) MG/3ML SOLN Take 3 mLs by nebulization every 6 (six) hours as needed (for wheezing/shortness of breath).    lactulose, encephalopathy, (CHRONULAC) 10 GM/15ML SOLN Take 20 g by mouth daily.    levothyroxine (SYNTHROID, LEVOTHROID) 112 MCG tablet Take 112 mcg by mouth daily before breakfast.    loperamide (IMODIUM) 2 MG capsule Take 2 mg by mouth as needed for diarrhea or loose stools.    LORazepam (ATIVAN) 0.5 MG tablet Take 0.5 mg by mouth every 8 (eight) hours as needed (for agitation).    magnesium hydroxide (MILK OF MAGNESIA) 400 MG/5ML suspension Take 30 mLs by mouth at bedtime as needed for mild constipation.    neomycin-bacitracin-polymyxin (NEOSPORIN) [id number] ointment Apply 1 application topically as needed (for wound care).    nystatin (MYCOSTATIN/NYSTOP) 100000 UNIT/GM POWD Apply 1 g topically 2 (two) times daily as needed (for dry skin).    omeprazole (PRILOSEC) 20 MG capsule Take 20 mg by mouth daily.    polyvinyl alcohol (LIQUIFILM TEARS) 1.4 % ophthalmic solution Place 1 drop into both eyes 2 (two) times daily.    simethicone (MYLICON) 80 MG chewable tablet Chew 80 mg by mouth every 8 (eight) hours.    Skin Protectants, Misc. (BAZA PROTECT EX) Apply 1 application topically 2 (two) times daily as needed (for redness or open wounds).    sucralfate (CARAFATE) 1 g tablet Take 1 g by mouth 4 (four) times daily -  with meals and at bedtime.    Vitamin D,  Ergocalciferol, (DRISDOL) 50000 units CAPS capsule Take 50,000 Units by mouth every 7 (seven) days. Pt takes on Tuesday.      STOP taking these medications     lisinopril (PRINIVIL,ZESTRIL) 5 MG tablet      metoprolol tartrate (LOPRESSOR) 25 MG tablet          DISCHARGE INSTRUCTIONS:   1. PCP f/u in 1-2 weeks 2. F/u with Dr. Andi Devon in 1-2 weeks  If you experience worsening of your admission symptoms, develop shortness of breath, life threatening emergency, suicidal or homicidal thoughts you must seek medical attention immediately by calling 911 or calling your MD immediately  if symptoms less severe.  You Must read complete instructions/literature along with all the possible adverse reactions/side effects for all the Medicines  you take and that have been prescribed to you. Take any new Medicines after you have completely understood and accept all the possible adverse reactions/side effects.   Please note  You were cared for by a hospitalist during your hospital stay. If you have any questions about your discharge medications or the care you received while you were in the hospital after you are discharged, you can call the unit and asked to speak with the hospitalist on call if the hospitalist that took care of you is not available. Once you are discharged, your primary care physician will handle any further medical issues. Please note that NO REFILLS for any discharge medications will be authorized once you are discharged, as it is imperative that you return to your primary care physician (or establish a relationship with a primary care physician if you do not have one) for your aftercare needs so that they can reassess your need for medications and monitor your lab values.    Today   CHIEF COMPLAINT:   Chief Complaint  Patient presents with  . Shortness of Breath    VITAL SIGNS:  Blood pressure 97/48, pulse 77, temperature 98.5 F (36.9 C), temperature source Oral,  resp. rate 20, height 5\' 7"  (1.702 m), weight 103.284 kg (227 lb 11.2 oz), SpO2 95 %.  I/O:   Intake/Output Summary (Last 24 hours) at 08/27/15 1601 Last data filed at 08/27/15 1452  Gross per 24 hour  Intake    120 ml  Output   2050 ml  Net  -1930 ml    PHYSICAL EXAMINATION:   Physical Exam  GENERAL:  82 y.o.-year-old obese patient lying in the bed with no acute distress.  EYES: Pupils equal, round, reactive to light and accommodation. No scleral icterus. Extraocular muscles intact.  HEENT: Head atraumatic, normocephalic. Oropharynx and nasopharynx clear.  NECK:  Supple, no jugular venous distention. No thyroid enlargement, no tenderness.  LUNGS: Normal breath sounds bilaterally, no wheezing, rhonchi or crepitation. No use of accessory muscles of respiration. Fine bibasilar crackles noted. CARDIOVASCULAR: S1, S2 normal. No rubs, or gallops. 3/6 systolic murmur present. ABDOMEN: Soft, non-tender, non-distended. Bowel sounds present. No organomegaly or mass.  EXTREMITIES: No pedal edema, cyanosis, or clubbing.  NEUROLOGIC: Cranial nerves II through XII are intact. Muscle strength 5/5 in all extremities. Sensation intact. Gait not checked.  PSYCHIATRIC: The patient is alert and oriented x 3.  SKIN: No obvious rash, lesion, or ulcer.   DATA REVIEW:   CBC  Recent Labs Lab 08/26/15 0605  WBC 7.9  HGB 11.9*  HCT 36.9  PLT 245    Chemistries   Recent Labs Lab 08/25/15 1636  08/27/15 0456  NA 131*  < > 135  K 4.6  < > 4.4  CL 97*  < > 97*  CO2 29  < > 34*  GLUCOSE 128*  < > 119*  BUN 29*  < > 30*  CREATININE 1.64*  < > 1.69*  CALCIUM 8.9  < > 9.2  MG  --   --  2.0  AST 18  --   --   ALT 13*  --   --   ALKPHOS 74  --   --   BILITOT 0.7  --   --   < > = values in this interval not displayed.  Cardiac Enzymes  Recent Labs Lab 08/26/15 1213  TROPONINI <0.03    Microbiology Results  Results for orders placed or performed during the hospital encounter of  08/25/15  Culture, blood (routine x 2)     Status: None (Preliminary result)   Collection Time: 08/25/15  4:42 PM  Result Value Ref Range Status   Specimen Description BLOOD RIGHT HAND  Final   Special Requests BOTTLES DRAWN AEROBIC AND ANAEROBIC 2CC  Final   Culture NO GROWTH 2 DAYS  Final   Report Status PENDING  Incomplete  Culture, blood (routine x 2)     Status: None (Preliminary result)   Collection Time: 08/25/15  4:45 PM  Result Value Ref Range Status   Specimen Description BLOOD RIGHT FATTY CASTS  Final   Special Requests BOTTLES DRAWN AEROBIC AND ANAEROBIC 5CC  Final   Culture NO GROWTH 2 DAYS  Final   Report Status PENDING  Incomplete    RADIOLOGY:  Dg Chest 2 View  08/27/2015  CLINICAL DATA:  Congestive heart failure. Chronic kidney disease stage 3. Colon carcinoma. EXAM: CHEST  2 VIEW COMPARISON:  08/25/2015 FINDINGS: Moderate cardiomegaly remains stable. Both lungs are well aerated and clear. No evidence of pneumothorax or pleural effusion. IMPRESSION: Stable cardiomegaly.  No active lung disease. Electronically Signed   By: Earle Gell M.D.   On: 08/27/2015 13:50   Dg Chest 2 View  08/25/2015  CLINICAL DATA:  Worsening cough and shortness of breath. EXAM: CHEST  2 VIEW COMPARISON:  09/07/2012 FINDINGS: The cardiac silhouette is enlarged. Mediastinal contours appear intact. There is no evidence of focal airspace consolidation, pleural effusion or pneumothorax. There is pulmonary vascular congestion. Osseous structures are without acute abnormality. Soft tissues are grossly normal. IMPRESSION: Enlarged cardiac silhouette with pulmonary vascular congestion. No evidence of focal airspace consolidation. Electronically Signed   By: Fidela Salisbury M.D.   On: 08/25/2015 17:14    EKG:   Orders placed or performed during the hospital encounter of 08/25/15  . EKG 12-Lead  . EKG 12-Lead  . ED EKG  . ED EKG      Management plans discussed with the patient, family and they  are in agreement.  CODE STATUS:     Code Status Orders        Start     Ordered   08/25/15 2045  Full code   Continuous     08/25/15 2044    Code Status History    Date Active Date Inactive Code Status Order ID Comments User Context   This patient has a current code status but no historical code status.      TOTAL TIME TAKING CARE OF THIS PATIENT: 37 minutes.    Tina [name] M.D on 08/27/2015 at 4:01 PM  Between 7am to 6pm - Pager - [phone]  After 6pm go to [email or web address] - password [email or web address]  [facility]  Office  [phone]  CC: Primary care physician; No primary care provider on file.

## 2015-08-27 NOTE — Care Management Important Message (Signed)
Important Message  Patient Details  Name: Tina [name] MRN: [id number] Date of Birth: 02-Feb-1928   Medicare Important Message Given:       Juliann Pulse A Nicholous Girgenti 08/27/2015, 10:20 AM

## 2015-09-01 LAB — CULTURE, BLOOD (ROUTINE X 2)
Culture: NO GROWTH
Culture: NO GROWTH

## 2015-09-14 ENCOUNTER — Ambulatory Visit: Payer: Medicare Other | Admitting: Family

## 2015-09-14 ENCOUNTER — Telehealth: Payer: Self-pay | Admitting: Family

## 2015-09-14 NOTE — Telephone Encounter (Signed)
Patient did not show for her initial appointment at the [facility] on 09/14/15. Will attempt to reschedule.

## 2016-08-23 ENCOUNTER — Inpatient Hospital Stay: Payer: Medicare Other

## 2016-08-23 ENCOUNTER — Emergency Department: Payer: Medicare Other

## 2016-08-23 ENCOUNTER — Inpatient Hospital Stay
Admission: EM | Admit: 2016-08-23 | Discharge: 2016-08-26 | DRG: 190 | Disposition: A | Discharge status: Skilled Nursing Facility | Payer: Medicare Other | Attending: Internal Medicine | Admitting: Internal Medicine

## 2016-08-23 DIAGNOSIS — R609 Edema, unspecified: Secondary | ICD-10-CM

## 2016-08-23 DIAGNOSIS — M7989 Other specified soft tissue disorders: Secondary | ICD-10-CM | POA: Diagnosis present

## 2016-08-23 DIAGNOSIS — I248 Other forms of acute ischemic heart disease: Secondary | ICD-10-CM | POA: Diagnosis present

## 2016-08-23 DIAGNOSIS — I4891 Unspecified atrial fibrillation: Secondary | ICD-10-CM | POA: Diagnosis present

## 2016-08-23 DIAGNOSIS — J440 Chronic obstructive pulmonary disease with acute lower respiratory infection: Secondary | ICD-10-CM | POA: Diagnosis present

## 2016-08-23 DIAGNOSIS — Z794 Long term (current) use of insulin: Secondary | ICD-10-CM

## 2016-08-23 DIAGNOSIS — Z85038 Personal history of other malignant neoplasm of large intestine: Secondary | ICD-10-CM

## 2016-08-23 DIAGNOSIS — J189 Pneumonia, unspecified organism: Secondary | ICD-10-CM

## 2016-08-23 DIAGNOSIS — M6281 Muscle weakness (generalized): Secondary | ICD-10-CM

## 2016-08-23 DIAGNOSIS — Z91018 Allergy to other foods: Secondary | ICD-10-CM

## 2016-08-23 DIAGNOSIS — Z7982 Long term (current) use of aspirin: Secondary | ICD-10-CM

## 2016-08-23 DIAGNOSIS — R1084 Generalized abdominal pain: Secondary | ICD-10-CM | POA: Diagnosis present

## 2016-08-23 DIAGNOSIS — F419 Anxiety disorder, unspecified: Secondary | ICD-10-CM | POA: Diagnosis present

## 2016-08-23 DIAGNOSIS — M545 Low back pain: Secondary | ICD-10-CM | POA: Diagnosis present

## 2016-08-23 DIAGNOSIS — Z79899 Other long term (current) drug therapy: Secondary | ICD-10-CM

## 2016-08-23 DIAGNOSIS — M25552 Pain in left hip: Secondary | ICD-10-CM | POA: Diagnosis present

## 2016-08-23 DIAGNOSIS — R51 Headache: Secondary | ICD-10-CM | POA: Diagnosis present

## 2016-08-23 DIAGNOSIS — G8929 Other chronic pain: Secondary | ICD-10-CM | POA: Diagnosis present

## 2016-08-23 DIAGNOSIS — Z9981 Dependence on supplemental oxygen: Secondary | ICD-10-CM

## 2016-08-23 DIAGNOSIS — E669 Obesity, unspecified: Secondary | ICD-10-CM | POA: Diagnosis present

## 2016-08-23 DIAGNOSIS — W050XXA Fall from non-moving wheelchair, initial encounter: Secondary | ICD-10-CM | POA: Diagnosis present

## 2016-08-23 DIAGNOSIS — F0390 Unspecified dementia without behavioral disturbance: Secondary | ICD-10-CM | POA: Diagnosis present

## 2016-08-23 DIAGNOSIS — I2510 Atherosclerotic heart disease of native coronary artery without angina pectoris: Secondary | ICD-10-CM | POA: Diagnosis present

## 2016-08-23 DIAGNOSIS — Z9049 Acquired absence of other specified parts of digestive tract: Secondary | ICD-10-CM

## 2016-08-23 DIAGNOSIS — M1990 Unspecified osteoarthritis, unspecified site: Secondary | ICD-10-CM | POA: Diagnosis present

## 2016-08-23 DIAGNOSIS — E1122 Type 2 diabetes mellitus with diabetic chronic kidney disease: Secondary | ICD-10-CM | POA: Diagnosis present

## 2016-08-23 DIAGNOSIS — K760 Fatty (change of) liver, not elsewhere classified: Secondary | ICD-10-CM | POA: Diagnosis present

## 2016-08-23 DIAGNOSIS — J9610 Chronic respiratory failure, unspecified whether with hypoxia or hypercapnia: Secondary | ICD-10-CM | POA: Diagnosis present

## 2016-08-23 DIAGNOSIS — I959 Hypotension, unspecified: Secondary | ICD-10-CM

## 2016-08-23 DIAGNOSIS — D72829 Elevated white blood cell count, unspecified: Secondary | ICD-10-CM

## 2016-08-23 DIAGNOSIS — N183 Chronic kidney disease, stage 3 (moderate): Secondary | ICD-10-CM | POA: Diagnosis present

## 2016-08-23 DIAGNOSIS — E871 Hypo-osmolality and hyponatremia: Secondary | ICD-10-CM | POA: Diagnosis present

## 2016-08-23 DIAGNOSIS — J9811 Atelectasis: Secondary | ICD-10-CM | POA: Diagnosis present

## 2016-08-23 DIAGNOSIS — N179 Acute kidney failure, unspecified: Secondary | ICD-10-CM | POA: Diagnosis present

## 2016-08-23 DIAGNOSIS — R109 Unspecified abdominal pain: Secondary | ICD-10-CM

## 2016-08-23 DIAGNOSIS — Z9861 Coronary angioplasty status: Secondary | ICD-10-CM

## 2016-08-23 DIAGNOSIS — W19XXXA Unspecified fall, initial encounter: Secondary | ICD-10-CM

## 2016-08-23 DIAGNOSIS — I5032 Chronic diastolic (congestive) heart failure: Secondary | ICD-10-CM | POA: Diagnosis present

## 2016-08-23 DIAGNOSIS — Z87891 Personal history of nicotine dependence: Secondary | ICD-10-CM

## 2016-08-23 DIAGNOSIS — R531 Weakness: Secondary | ICD-10-CM

## 2016-08-23 LAB — TROPONIN I: Troponin I: <0.03 ng/mL (ref <0.03)

## 2016-08-23 LAB — URINALYSIS, COMPLETE (UACMP) WITH MICROSCOPIC
BILIRUBIN URINE: NEGATIVE
Bacteria, UA: NONE SEEN
Glucose, UA: NEGATIVE mg/dL
Ketones, ur: NEGATIVE mg/dL
Leukocytes, UA: NEGATIVE
Nitrite: NEGATIVE
PH: 5 (ref 5.0–8.0)
Protein, ur: NEGATIVE mg/dL
SPECIFIC GRAVITY, URINE: 1.013 (ref 1.005–1.030)

## 2016-08-23 LAB — BASIC METABOLIC PANEL
ANION GAP: 6 (ref 5–15)
BUN: 21 mg/dL — ABNORMAL HIGH (ref 6–20)
CHLORIDE: 96 mmol/L — AB (ref 101–111)
CO2: 31 mmol/L (ref 22–32)
Calcium: 9.5 mg/dL (ref 8.9–10.3)
Creatinine, Ser: 1.49 mg/dL — ABNORMAL HIGH (ref 0.44–1.00)
GFR calc Af Amer: 35 mL/min — ABNORMAL LOW (ref >60)
GFR, EST NON AFRICAN AMERICAN: 31 mL/min — AB (ref >60)
GLUCOSE: 146 mg/dL — AB (ref 65–99)
POTASSIUM: 4.2 mmol/L (ref 3.5–5.1)
Sodium: 133 mmol/L — ABNORMAL LOW (ref 135–145)

## 2016-08-23 LAB — CBC
HEMATOCRIT: 39.6 % (ref 35.0–47.0)
HEMOGLOBIN: 13.1 g/dL (ref 12.0–16.0)
MCH: 28.9 pg (ref 26.0–34.0)
MCHC: 33.2 g/dL (ref 32.0–36.0)
MCV: 87.3 fL (ref 80.0–100.0)
PLATELETS: 217 10*3/uL (ref 150–440)
RBC: 4.54 MIL/uL (ref 3.80–5.20)
RDW: 16.3 % — ABNORMAL HIGH (ref 11.5–14.5)
WBC: 20.9 10*3/uL — AB (ref 3.6–11.0)

## 2016-08-23 LAB — GLUCOSE, CAPILLARY
GLUCOSE-CAPILLARY: 134 mg/dL — AB (ref 65–99)
Glucose-Capillary: 134 mg/dL — ABNORMAL HIGH (ref 65–99)

## 2016-08-23 LAB — PROCALCITONIN: PROCALCITONIN: 0.31 ng/mL

## 2016-08-23 LAB — MRSA PCR SCREENING: MRSA by PCR: NEGATIVE

## 2016-08-23 LAB — TSH: TSH: 0.981 u[IU]/mL (ref 0.350–4.500)

## 2016-08-23 LAB — LACTIC ACID, PLASMA
LACTIC ACID, VENOUS: 1.7 mmol/L (ref 0.5–1.9)
Lactic Acid, Venous: 1.2 mmol/L (ref 0.5–1.9)

## 2016-08-23 LAB — PROTIME-INR
INR: 1.14
Prothrombin Time: 14.7 seconds (ref 11.4–15.2)

## 2016-08-23 LAB — APTT: aPTT: 29 seconds (ref 24–36)

## 2016-08-23 MED ORDER — LOPERAMIDE HCL 2 MG PO CAPS: 2.0000 mg | ORAL_CAPSULE | ORAL | Status: DC | PRN

## 2016-08-23 MED ORDER — INSULIN ASPART 100 UNIT/ML ~~LOC~~ SOLN: 0.0000 [IU] | Freq: Every day | SUBCUTANEOUS | Status: DC

## 2016-08-23 MED ORDER — INSULIN GLARGINE 100 UNIT/ML ~~LOC~~ SOLN
10.0000 [IU] | Freq: Every day | SUBCUTANEOUS | Status: DC
  Administered 2016-08-23 – 2016-08-25 (×3): 10 [IU] via SUBCUTANEOUS
  Filled 2016-08-23 (×4): qty 0.1

## 2016-08-23 MED ORDER — LACTULOSE 10 GM/15ML PO SOLN
20.0000 g | Freq: Every day | ORAL | Status: DC
  Administered 2016-08-24 – 2016-08-26 (×3): 20 g via ORAL
  Filled 2016-08-23 (×3): qty 30

## 2016-08-23 MED ORDER — ONDANSETRON HCL 4 MG/2ML IJ SOLN: 4.0000 mg | Freq: Four times a day (QID) | INTRAMUSCULAR | Status: DC | PRN

## 2016-08-23 MED ORDER — SIMETHICONE 80 MG PO CHEW: 80.0000 mg | CHEWABLE_TABLET | Freq: Three times a day (TID) | ORAL | Status: DC | PRN

## 2016-08-23 MED ORDER — INSULIN ASPART 100 UNIT/ML ~~LOC~~ SOLN
4.0000 [IU] | Freq: Three times a day (TID) | SUBCUTANEOUS | Status: DC
  Administered 2016-08-24: 4 [IU] via SUBCUTANEOUS
  Filled 2016-08-23: qty 4

## 2016-08-23 MED ORDER — LEVOFLOXACIN IN D5W 750 MG/150ML IV SOLN
750.0000 mg | Freq: Once | INTRAVENOUS | Status: AC
  Administered 2016-08-23: 750 mg via INTRAVENOUS
  Filled 2016-08-23: qty 150

## 2016-08-23 MED ORDER — ALUM & MAG HYDROXIDE-SIMETH 200-200-20 MG/5ML PO SUSP: 30.0000 mL | Freq: Four times a day (QID) | ORAL | Status: DC | PRN

## 2016-08-23 MED ORDER — PIPERACILLIN-TAZOBACTAM 3.375 G IVPB
3.3750 g | Freq: Three times a day (TID) | INTRAVENOUS | Status: DC
  Administered 2016-08-23 – 2016-08-26 (×9): 3.375 g via INTRAVENOUS
  Filled 2016-08-23 (×9): qty 50

## 2016-08-23 MED ORDER — SODIUM CHLORIDE 0.9 % IV SOLN
Freq: Once | INTRAVENOUS | Status: AC
  Administered 2016-08-23: 12:00:00 via INTRAVENOUS

## 2016-08-23 MED ORDER — POTASSIUM CHLORIDE CRYS ER 20 MEQ PO TBCR
20.0000 meq | EXTENDED_RELEASE_TABLET | Freq: Every day | ORAL | Status: DC
  Administered 2016-08-24 – 2016-08-26 (×3): 20 meq via ORAL
  Filled 2016-08-23 (×3): qty 1

## 2016-08-23 MED ORDER — TIOTROPIUM BROMIDE MONOHYDRATE 18 MCG IN CAPS
18.0000 ug | ORAL_CAPSULE | Freq: Every day | RESPIRATORY_TRACT | Status: DC
  Administered 2016-08-23 – 2016-08-26 (×3): 18 ug via RESPIRATORY_TRACT
  Filled 2016-08-23: qty 5

## 2016-08-23 MED ORDER — DOCUSATE SODIUM 100 MG PO CAPS
100.0000 mg | ORAL_CAPSULE | Freq: Two times a day (BID) | ORAL | Status: DC
  Administered 2016-08-23 – 2016-08-26 (×6): 100 mg via ORAL
  Filled 2016-08-23 (×6): qty 1

## 2016-08-23 MED ORDER — MAGNESIUM HYDROXIDE 400 MG/5ML PO SUSP
30.0000 mL | Freq: Every evening | ORAL | Status: DC | PRN
  Administered 2016-08-26: 30 mL via ORAL
  Filled 2016-08-23: qty 30

## 2016-08-23 MED ORDER — VANCOMYCIN HCL IN DEXTROSE 1-5 GM/200ML-% IV SOLN
1000.0000 mg | Freq: Once | INTRAVENOUS | Status: AC
  Administered 2016-08-23: 1000 mg via INTRAVENOUS
  Filled 2016-08-23: qty 200

## 2016-08-23 MED ORDER — GUAIFENESIN ER 600 MG PO TB12: 600.0000 mg | ORAL_TABLET | Freq: Two times a day (BID) | ORAL | Status: DC | PRN

## 2016-08-23 MED ORDER — ACETAMINOPHEN 325 MG PO TABS
650.0000 mg | ORAL_TABLET | Freq: Four times a day (QID) | ORAL | Status: DC | PRN
  Administered 2016-08-23 – 2016-08-24 (×3): 650 mg via ORAL
  Filled 2016-08-23 (×3): qty 2

## 2016-08-23 MED ORDER — POLYVINYL ALCOHOL 1.4 % OP SOLN
1.0000 [drp] | Freq: Two times a day (BID) | OPHTHALMIC | Status: DC
  Administered 2016-08-23 – 2016-08-26 (×6): 1 [drp] via OPHTHALMIC
  Filled 2016-08-23: qty 15

## 2016-08-23 MED ORDER — ACETAMINOPHEN 500 MG PO TABS: 500.0000 mg | ORAL_TABLET | ORAL | Status: DC | PRN

## 2016-08-23 MED ORDER — BUPROPION HCL ER (XL) 150 MG PO TB24
150.0000 mg | ORAL_TABLET | Freq: Every day | ORAL | Status: DC
  Administered 2016-08-24 – 2016-08-26 (×3): 150 mg via ORAL
  Filled 2016-08-23 (×3): qty 1

## 2016-08-23 MED ORDER — ENOXAPARIN SODIUM 40 MG/0.4ML ~~LOC~~ SOLN
40.0000 mg | SUBCUTANEOUS | Status: DC
  Administered 2016-08-23: 40 mg via SUBCUTANEOUS
  Filled 2016-08-23: qty 0.4

## 2016-08-23 MED ORDER — SODIUM CHLORIDE 0.9 % IV SOLN: 250.0000 mL | INTRAVENOUS | Status: DC | PRN

## 2016-08-23 MED ORDER — MOMETASONE FURO-FORMOTEROL FUM 200-5 MCG/ACT IN AERO
2.0000 | INHALATION_SPRAY | Freq: Two times a day (BID) | RESPIRATORY_TRACT | Status: DC
  Administered 2016-08-23 – 2016-08-26 (×6): 2 via RESPIRATORY_TRACT
  Filled 2016-08-23: qty 8.8

## 2016-08-23 MED ORDER — PANTOPRAZOLE SODIUM 40 MG PO TBEC
40.0000 mg | DELAYED_RELEASE_TABLET | Freq: Every day | ORAL | Status: DC
  Administered 2016-08-24 – 2016-08-26 (×3): 40 mg via ORAL
  Filled 2016-08-23 (×3): qty 1

## 2016-08-23 MED ORDER — VANCOMYCIN HCL IN DEXTROSE 750-5 MG/150ML-% IV SOLN
750.0000 mg | INTRAVENOUS | Status: DC
  Administered 2016-08-23: 750 mg via INTRAVENOUS
  Filled 2016-08-23 (×2): qty 150

## 2016-08-23 MED ORDER — PIPERACILLIN-TAZOBACTAM 3.375 G IVPB
3.3750 g | Freq: Once | INTRAVENOUS | Status: AC
  Administered 2016-08-23: 3.375 g via INTRAVENOUS
  Filled 2016-08-23: qty 50

## 2016-08-23 MED ORDER — ONDANSETRON HCL 4 MG PO TABS: 4.0000 mg | ORAL_TABLET | Freq: Four times a day (QID) | ORAL | Status: DC | PRN

## 2016-08-23 MED ORDER — LORAZEPAM 0.5 MG PO TABS: 0.5000 mg | ORAL_TABLET | Freq: Three times a day (TID) | ORAL | Status: DC | PRN

## 2016-08-23 MED ORDER — SERTRALINE HCL 50 MG PO TABS
50.0000 mg | ORAL_TABLET | Freq: Every day | ORAL | Status: DC
  Administered 2016-08-23 – 2016-08-25 (×3): 50 mg via ORAL
  Filled 2016-08-23 (×4): qty 1

## 2016-08-23 MED ORDER — INSULIN ASPART 100 UNIT/ML ~~LOC~~ SOLN
0.0000 [IU] | Freq: Three times a day (TID) | SUBCUTANEOUS | Status: DC
  Administered 2016-08-26 (×2): 2 [IU] via SUBCUTANEOUS
  Filled 2016-08-23 (×2): qty 2

## 2016-08-23 MED ORDER — FERROUS SULFATE 325 (65 FE) MG PO TABS
325.0000 mg | ORAL_TABLET | ORAL | Status: DC
  Administered 2016-08-24 – 2016-08-26 (×2): 325 mg via ORAL
  Filled 2016-08-23 (×2): qty 1

## 2016-08-23 MED ORDER — GUAIFENESIN 100 MG/5ML PO SYRP
200.0000 mg | ORAL_SOLUTION | Freq: Four times a day (QID) | ORAL | Status: DC | PRN
  Filled 2016-08-23: qty 10

## 2016-08-23 MED ORDER — ZINC OXIDE 20 % EX OINT
TOPICAL_OINTMENT | Freq: Two times a day (BID) | CUTANEOUS | Status: DC | PRN
  Filled 2016-08-23: qty 28.35

## 2016-08-23 MED ORDER — SODIUM CHLORIDE 0.9% FLUSH
3.0000 mL | Freq: Two times a day (BID) | INTRAVENOUS | Status: DC
  Administered 2016-08-24 – 2016-08-25 (×2): 3 mL via INTRAVENOUS

## 2016-08-23 MED ORDER — LEVOTHYROXINE SODIUM 112 MCG PO TABS
112.0000 ug | ORAL_TABLET | Freq: Every day | ORAL | Status: DC
  Administered 2016-08-24 – 2016-08-26 (×3): 112 ug via ORAL
  Filled 2016-08-23 (×3): qty 1

## 2016-08-23 MED ORDER — IPRATROPIUM-ALBUTEROL 0.5-2.5 (3) MG/3ML IN SOLN: 3.0000 mL | RESPIRATORY_TRACT | Status: DC | PRN

## 2016-08-23 MED ORDER — SODIUM CHLORIDE 0.9% FLUSH
3.0000 mL | Freq: Two times a day (BID) | INTRAVENOUS | Status: DC
  Administered 2016-08-23 – 2016-08-26 (×5): 3 mL via INTRAVENOUS

## 2016-08-23 MED ORDER — FUROSEMIDE 40 MG PO TABS
40.0000 mg | ORAL_TABLET | Freq: Two times a day (BID) | ORAL | Status: DC
  Administered 2016-08-23 – 2016-08-26 (×7): 40 mg via ORAL
  Filled 2016-08-23 (×7): qty 1

## 2016-08-23 MED ORDER — ACETAMINOPHEN 650 MG RE SUPP: 650.0000 mg | Freq: Four times a day (QID) | RECTAL | Status: DC | PRN

## 2016-08-23 MED ORDER — DONEPEZIL HCL 5 MG PO TABS
5.0000 mg | ORAL_TABLET | Freq: Every day | ORAL | Status: DC
  Administered 2016-08-24 – 2016-08-26 (×3): 5 mg via ORAL
  Filled 2016-08-23 (×3): qty 1

## 2016-08-23 MED ORDER — SODIUM CHLORIDE 0.9% FLUSH: 3.0000 mL | INTRAVENOUS | Status: DC | PRN

## 2016-08-23 MED ORDER — ASPIRIN 81 MG PO CHEW: 81.0000 mg | CHEWABLE_TABLET | Freq: Every day | ORAL | Status: DC

## 2016-08-23 NOTE — Progress Notes (Signed)
[place] responding to OR for Prayer visited the Pt. [place] talked with the Pt who seemed to be confused, repeated words that made no sense, and told the [facility] that her son found her unresponsive and brought her to hospital. Pt brought up religous topics and reviewed religous themes she felt were helpful to her such as: God has be good to me she stated and also mentioned that she was trusting God to help her. [place] affirmed the Pt's aspirations and offered prayers for Pt.     08/23/16 2200  Clinical Encounter Type  Visited With Patient  Visit Type Initial;Spiritual support  Referral From Nurse  Consult/Referral To Chaplain  Spiritual Encounters  Spiritual Needs Prayer;Other (Comment)

## 2016-08-23 NOTE — ED Notes (Signed)
Pt oxygen dropped to 87% RA, placed on 2 L nasal cannula, came up to 96%, will continue to monitor.

## 2016-08-23 NOTE — Progress Notes (Signed)
Anticoagulation monitoring(Lovenox):  83 yo female ordered Lovenox 30 mg Q24h  Filed Weights   08/23/16 1648  Weight: 230 lb 11.2 oz (104.6 kg)   BMI    Lab Results  Component Value Date   CREATININE 1.49 (H) 08/23/2016   CREATININE 1.69 (H) 08/27/2015   CREATININE 1.74 (H) 08/26/2015   Estimated Creatinine Clearance: 33.7 mL/min (by C-G formula based on SCr of 1.49 mg/dL (H)). Hemoglobin & Hematocrit     Component Value Date/Time   HGB 13.1 08/23/2016 1148   HGB 13.5 09/13/2012 0022   HCT 39.6 08/23/2016 1148   HCT 43.2 09/13/2012 0022     Per Protocol for Patient with estCrcl > 30 ml/min and BMI < 40, will transition to Lovenox 40 mg Q24h.

## 2016-08-23 NOTE — H&P (Signed)
[facility] [place] NAME: Tina [name]    MR#:  [id number]  DATE OF BIRTH:  [birth year]  DATE OF ADMISSION:  08/23/2016  PRIMARY CARE PHYSICIAN: No primary care provider.   REQUESTING/REFERRING PHYSICIAN:   CHIEF COMPLAINT:   Chief Complaint  Patient presents with  . Fall    HISTORY OF PRESENT ILLNESS: Tina [name]  is a 83 y.o. female with a known history of CHF, diabetes, CAD stage III, obesity, COPD, chronic respiratory failure, supposed to be on oxygen therapy, however, refuses, who presents to the hospital with complaints of fatigue, weakness, cough, unknown period of time, worsening lower extremity swelling, wheezing, white phlegm production, shortness of breath. On arrival to the hospital patient was noted to the hypotensive, systolic blood pressure of 96, in atrial fibrillation, controlled rate of 76, tachypneic, labs revealed an elevated white blood cell count of almost 21,000, chest x-ray revealed left middle lobe pneumonia. Hospitalist services were contacted for admission. Patient not able to provide much history due to dementia, although admits of some left-sided chest pains, shortness of breath, cough, wheezing, white phlegm production for unknown period of time  PAST MEDICAL HISTORY:   Past Medical History:  Diagnosis Date  . Anemia   . Anxiety   . Bronchitis   . CAD (coronary artery disease)    s/p PCI  . CKD stage 3 due to type 2 diabetes mellitus ([place])   . Colon cancer ([place])    treated, surgery, chemo, radiation  . COPD (chronic obstructive pulmonary disease) (HCC)    on 2L home o2  . Diabetes mellitus without complication ([place])   . Obesity   . Osteoarthritis   . Renal disorder     PAST SURGICAL HISTORY: Past Surgical History:  Procedure Laterality Date  . ABDOMINAL HYSTERECTOMY    . CATARACT EXTRACTION, BILATERAL    . PARTIAL COLECTOMY      SOCIAL HISTORY:  Social History  Substance Use Topics  .  Smoking status: Former Research scientist (life sciences)  . Smokeless tobacco: Not on file     Comment: quit 20 years ago  . Alcohol use No    FAMILY HISTORY:  Family History  Problem Relation Age of Onset  . Diabetes Mellitus II Mother     DRUG ALLERGIES:  Allergies  Allergen Reactions  . Corn-Containing Products Other (See Comments)    Reaction:  Unknown     Review of Systems  Unable to perform ROS: Dementia  Constitutional: Negative for chills, fever and weight loss.  HENT: Negative for congestion.   Eyes: Negative for blurred vision and double vision.  Respiratory: Positive for cough, sputum production, shortness of breath and wheezing.   Cardiovascular: Positive for chest pain and leg swelling. Negative for palpitations, orthopnea and PND.  Gastrointestinal: Negative for abdominal pain, blood in stool, constipation, diarrhea, nausea and vomiting.  Genitourinary: Negative for dysuria, frequency, hematuria and urgency.  Musculoskeletal: Negative for falls.  Neurological: Negative for dizziness, tremors, focal weakness and headaches.  Endo/Heme/Allergies: Does not bruise/bleed easily.  Psychiatric/Behavioral: Negative for depression. The patient does not have insomnia.     MEDICATIONS AT HOME:  Prior to Admission medications   Medication Sig Start Date End Date Taking? Authorizing Provider  acetaminophen (TYLENOL) 500 MG tablet Take 500 mg by mouth every 4 (four) hours as needed for mild pain, fever or headache.   Yes Historical Provider, MD  albuterol (PROVENTIL HFA;VENTOLIN HFA) 108 (90 Base) MCG/ACT inhaler Inhale 2 puffs into  the lungs every 4 (four) hours as needed for wheezing or shortness of breath.   Yes Historical Provider, MD  alum & mag hydroxide-simeth (MAALOX PLUS) 400-400-40 MG/5ML suspension Take 30 mLs by mouth every 6 (six) hours as needed for indigestion.   Yes Historical Provider, MD  aspirin 81 MG chewable tablet Chew 81 mg by mouth daily.   Yes Historical Provider, MD  buPROPion  (WELLBUTRIN XL) 150 MG 24 hr tablet Take 150 mg by mouth daily.   Yes Historical Provider, MD  calcium carbonate (TUMS - DOSED IN MG ELEMENTAL CALCIUM) 500 MG chewable tablet Chew 1 tablet by mouth 2 (two) times daily.   Yes Historical Provider, MD  cetaphil (CETAPHIL) lotion Apply 1 application topically as needed for dry skin.   Yes Historical Provider, MD  Cholecalciferol (VITAMIN D) 2000 units CAPS Take 2,000 Units by mouth daily.   Yes Historical Provider, MD  clotrimazole (LOTRIMIN) 1 % cream Apply 1 application topically as needed (for rash under both breasts and left groin).   Yes Historical Provider, MD  docusate sodium (COLACE) 100 MG capsule Take 100 mg by mouth 2 (two) times daily.   Yes Historical Provider, MD  donepezil (ARICEPT) 5 MG tablet Take 5 mg by mouth daily.   Yes Historical Provider, MD  ferrous sulfate 325 (65 FE) MG tablet Take 325 mg by mouth 3 (three) times a week. Take 325 mg by mouth on Monday, Wednesday and Friday.   Yes Historical Provider, MD  Fluticasone-Salmeterol (ADVAIR) 250-50 MCG/DOSE AEPB Inhale 1 puff into the lungs 2 (two) times daily.   Yes Historical Provider, MD  furosemide (LASIX) 40 MG tablet Take 40 mg by mouth 2 (two) times daily. Take 40 mg by mouth twice daily at 6 AM and 4 PM.   Yes Historical Provider, MD  guaiFENesin (MUCINEX) 600 MG 12 hr tablet Take 600 mg by mouth 2 (two) times daily.   Yes Historical Provider, MD  guaifenesin (ROBITUSSIN) 100 MG/5ML syrup Take 200 mg by mouth every 6 (six) hours as needed for cough.   Yes Historical Provider, MD  insulin glargine (LANTUS) 100 UNIT/ML injection Inject 10 Units into the skin at bedtime.   Yes Historical Provider, MD  insulin regular (NOVOLIN R,HUMULIN R) 100 units/mL injection Inject 0-14 Units into the skin 2 (two) times daily as needed for high blood sugar (Per sliding scale). Inject twice daily (in the morning before morning meal and at bedtime before Lantus) as needed per sliding scale:     0-200:  0 units  201-250:  8 units  251-300:  10 units  301-350:  12 units  351-400:  14 units  Greater than 400 or less than 60:  Call MD   Yes Historical Provider, MD  ipratropium-albuterol (DUONEB) 0.5-2.5 (3) MG/3ML SOLN Take 3 mLs by nebulization every 6 (six) hours as needed (for wheezing/shortness of breath).   Yes Historical Provider, MD  lactulose, encephalopathy, (CHRONULAC) 10 GM/15ML SOLN Take 20 g by mouth daily.   Yes Historical Provider, MD  levothyroxine (SYNTHROID, LEVOTHROID) 112 MCG tablet Take 112 mcg by mouth daily before breakfast.   Yes Historical Provider, MD  loperamide (IMODIUM) 2 MG capsule Take 2 mg by mouth as needed for diarrhea or loose stools.   Yes Historical Provider, MD  LORazepam (ATIVAN) 0.5 MG tablet Take 0.5 mg by mouth every 8 (eight) hours as needed (for agitation).   Yes Historical Provider, MD  magnesium hydroxide (MILK OF MAGNESIA) 400 MG/5ML suspension Take  30 mLs by mouth at bedtime as needed for mild constipation.   Yes Historical Provider, MD  neomycin-bacitracin-polymyxin (NEOSPORIN) [phone] ointment Apply 1 application topically as needed (for wound care).   Yes Historical Provider, MD  nystatin (MYCOSTATIN/NYSTOP) 100000 UNIT/GM POWD Apply 1 g topically 2 (two) times daily as needed (for dry skin).   Yes Historical Provider, MD  omeprazole (PRILOSEC) 20 MG capsule Take 20 mg by mouth daily.   Yes Historical Provider, MD  polyvinyl alcohol (LIQUIFILM TEARS) 1.4 % ophthalmic solution Place 1 drop into both eyes 2 (two) times daily.   Yes Historical Provider, MD  potassium chloride SA (K-DUR,KLOR-CON) 20 MEQ tablet Take 20 mEq by mouth daily.   Yes Historical Provider, MD  sertraline (ZOLOFT) 50 MG tablet Take 50 mg by mouth daily. Take 50 mg by mouth daily at 8 PM.   Yes Historical Provider, MD  simethicone (MYLICON) 80 MG chewable tablet Chew 80 mg by mouth every 8 (eight) hours.   Yes Historical Provider, MD  Skin Protectants, Misc. (BAZA  PROTECT EX) Apply 1 application topically 2 (two) times daily as needed (for redness or open wounds).   Yes Historical Provider, MD      PHYSICAL EXAMINATION:   VITAL SIGNS: Blood pressure (!) 116/42, pulse 78, temperature 97.7 F (36.5 C), temperature source Oral, resp. rate 17, SpO2 100 %.  GENERAL:  83 y.o.-year-old patient lying in the bed In mild respiratory distress, intermittently coughing, dry and inefficient cough, somewhat short of breath, able to speak, however, is tachypneic when communicates. Has difficulty hearing.  EYES: Pupils equal, round, reactive to light and accommodation. No scleral icterus. Extraocular muscles intact.  HEENT: Head atraumatic, normocephalic. Oropharynx and nasopharynx clear.  NECK:  Supple, no jugular venous distention. No thyroid enlargement, no tenderness.  LUNGS: Some diminished breath sounds bilaterally, no wheezing, scattered rales,rhonchi and crepitations anteriorly. Intermittent use of accessory muscles of respiration.  CARDIOVASCULAR: S1, S2 , rhythm is regular. No murmurs, rubs, or gallops.  ABDOMEN: Soft, nontender, nondistended. Bowel sounds present. No organomegaly or mass.  EXTREMITIES: Significant 3+ lower extremity and pedal edema bilaterally, no cyanosis, or clubbing. Tenderness to palpation in the bilateral calves and anterior shins NEUROLOGIC: Cranial nerves II through XII are intact. Muscle strength 5/5 in all extremities. Sensation intact. Gait not checked. Difficulty hearing examination. Overall, this limited PSYCHIATRIC: The patient is somnolent, easily awakened, answers some questions, although due to hearing loss. Exam is incomplete  SKIN: No obvious rash, lesion, or ulcer. Mild erythema in the anterior shins. Somewhat increased warmth and tenderness to palpation  LABORATORY PANEL:   CBC  Recent Labs Lab 08/23/16 1148  WBC 20.9*  HGB 13.1  HCT 39.6  PLT 217  MCV 87.3  MCH 28.9  MCHC 33.2  RDW 16.3*    ------------------------------------------------------------------------------------------------------------------  Chemistries   Recent Labs Lab 08/23/16 1148  NA 133*  K 4.2  CL 96*  CO2 31  GLUCOSE 146*  BUN 21*  CREATININE 1.49*  CALCIUM 9.5   ------------------------------------------------------------------------------------------------------------------  Cardiac Enzymes No results for input(s): TROPONINI in the last 168 hours. ------------------------------------------------------------------------------------------------------------------  RADIOLOGY: Dg Chest 1 View  Result Date: 08/23/2016 CLINICAL DATA:  Unwitnessed fall. EXAM: CHEST 1 VIEW COMPARISON:  08/27/2015 . FINDINGS: Mediastinum and hilar structures normal. Cardiomegaly. Mild infiltrate left mid lung field cannot be excluded. Mild bibasilar atelectasis noted. No pleural effusion or pneumothorax. No acute bony abnormality identified. IMPRESSION: Mild left mid lung field infiltrate. Mild bibasilar subsegmental atelectasis. 2. Cardiomegaly. 3. No acute  bony abnormality identified.  No pneumothorax . Electronically Signed   By: Marcello Moores  Register   On: 08/23/2016 14:06   Dg Lumbar Spine 2-3 Views  Result Date: 08/23/2016 CLINICAL DATA:  Recent fall with low back pain, initial encounter EXAM: LUMBAR SPINE - 2-3 VIEW COMPARISON:  None. FINDINGS: Five lumbar type vertebral bodies are well visualized. Vertebral body height is well maintained. No anterolisthesis is noted. Multilevel disc space narrowing is seen. Dilatation of the distal abdominal aorta is noted but incompletely evaluated on this exam. IMPRESSION: Degenerative change without acute abnormality. Electronically Signed   By: Inez Catalina M.D.   On: 08/23/2016 13:08   Ct Head Wo Contrast  Result Date: 08/23/2016 CLINICAL DATA:  Unwitnessed fall. EXAM: CT HEAD WITHOUT CONTRAST TECHNIQUE: Contiguous axial images were obtained from the base of the skull through  the vertex without intravenous contrast. COMPARISON:  04/08/2014 FINDINGS: Brain: Expected cerebral volume loss for age. Resultant prominence of the extra-axial spaces adjacent the frontal lobes. No mass lesion, hemorrhage, hydrocephalus, acute infarct, intra-axial, or extra-axial fluid collection. Vascular: No hyperdense vessel or unexpected calcification. Skull: No significant soft tissue swelling. Hyperostosis frontalis interna. Sinuses/Orbits: Normal orbits and globes. Partial opacification of left mastoid air cells is chronic. Other paranasal sinuses are clear. Other: None IMPRESSION: 1.  No acute intracranial abnormality. 2. Partial left mastoid opacification, similar. Electronically Signed   By: Abigail Miyamoto M.D.   On: 08/23/2016 12:55   Dg Hip Unilat With Pelvis 2-3 Views Left  Result Date: 08/23/2016 CLINICAL DATA:  from [place] following an unwitnessed fall. Pt got up out of wheelchair, got dizzy, went to sit down and missed wheelchair, Pt c/o low back pain, head pain, L hip pain EXAM: DG HIP (WITH OR WITHOUT PELVIS) 2-3V LEFT COMPARISON:  None. FINDINGS: No fracture.  No dislocation. There are advanced arthropathic changes of the left hip with severe superior joint space narrowing, mild subchondral sclerosis and subchondral cystic change. Small marginal osteophytes noted from the base of the left femoral head. There is mild axial right hip joint space narrowing. The SI joints and symphysis pubis are normally spaced and aligned. The bones are diffusely demineralized. Soft tissues are unremarkable. IMPRESSION: 1. No fracture, dislocation or acute finding. 2. 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 Electronically Signed   By: Lajean Manes M.D.   On: 08/23/2016 13:03    EKG: Orders placed or performed during the hospital encounter of 08/23/16  . ED EKG  . ED EKG  . EKG 12-Lead  . EKG 12-Lead  EKG in the emergency room showed sinus rhythm at rate of 70 bpm, atrial premature  complexes, incomplete right bundle branch block, low voltage QRS in precordial leads, no acute ST-T changes  IMPRESSION AND PLAN:  Active Problems:   Healthcare-associated pneumonia   Hypotension   Leukocytosis   Hyponatremia   Generalized weakness   Leg swelling  #1. Healthcare associated pneumonia in left mid lobe, admit the patient to the medical floor, continue broad-spectrum antibiotic therapy, get sputum cultures if possible, continue nebulizing therapy, steroids #2. Lower extremity swelling, rule out DVT by Doppler's #3. Hypotension, resolved with IV fluids, discontinue IV fluids, follow clinically and not willing to give IV fluid continuously due to significant lower extremity swelling, history of CHF #4. Hyponatremia and fluid overloaded patient, continue Lasix, follow sodium level in the morning, #5. Generalized weakness, get physical therapist involved #6. Atrial fibrillation episode , according to vital sign documentation in emergency room with episode of  hypotension, now in sinus rhythm, follow closely   All the records are reviewed and case discussed with ED provider. Management plans discussed with the patient, family and they are in agreement.  CODE STATUS: Code Status History    Date Active Date Inactive Code Status Order ID Comments User Context   08/25/2015  8:44 PM 08/27/2015  8:29 PM Full Code [id number]  Gladstone Lighter, MD Inpatient       TOTAL TIME TAKING CARE OF THIS PATIENT: 60 minutes.  Discussed with patient's son  Theodoro Grist M.D on 08/23/2016 at 3:25 PM  Between 7am to 6pm - Pager - [phone] After 6pm go to [email or web address] - password [email or web address] [facility]  [facility]  Office  [phone]  CC: Primary care physician; No primary care provider on file.

## 2016-08-23 NOTE — ED Notes (Signed)
Pt taken to CT and then to x-ray via stretcher

## 2016-08-23 NOTE — ED Triage Notes (Signed)
Pt presents to ED via [place]  from [place] following an unwitnessed fall. Pt got up out of wheelchair, got dizzy, went to sit down and missed wheelchair. Pt unsure of blood thinner use. Pt c/o back pain, head pain, L hip pain, appears shortened and externally rotated. Pt alert and oriented, answers questions. Pt denies LOC.

## 2016-08-23 NOTE — Progress Notes (Signed)
Pharmacy Antibiotic Note  Tina [name] is a 83 y.o. female admitted on 08/23/2016 with pneumonia.  Pharmacy has been consulted for vancomycin and piperacillin/tazobactam dosing. Patient received a dose of each in ED and tolerated.  Plan: Piperacillin/tazobactam 3.375 g IV q8h EI  Vancomycin 1000 mg given in ED. Will order vancomycin 750 mg IV q24h (6 hour stacked dosing) Goal vancomycin trough 15-20 mcg/mL Vancomycin trough ordered 2/1 at 2100  Kinetics: Using adjusted body weight = 71 kg Ke: 0.030 Half-life: 23 hrs Vd: 49 L Cmin ~15 mcg/mL calculated  MRSA PCR and PCT ordered per protocol.  Height: 5\' 7"  (170.2 cm) Weight: 230 lb 11.2 oz (104.6 kg) IBW/kg (Calculated) : 61.6  Temp (24hrs), Avg:97.9 F (36.6 C), Min:97.7 F (36.5 C), Max:98.1 F (36.7 C)   Recent Labs Lab 08/23/16 1148 08/23/16 1514 08/23/16 1715  WBC 20.9*  --   --   CREATININE 1.49*  --   --   LATICACIDVEN  --  1.2 1.7    Estimated Creatinine Clearance: 33.7 mL/min (by C-G formula based on SCr of 1.49 mg/dL (H)).    Allergies  Allergen Reactions  . Corn-Containing Products Other (See Comments)    Reaction:  Unknown    Antimicrobials this admission: Piperacillin/tazobactam 1/30 >>  vancomycin 1/30 >>   Dose adjustments this admission:  Microbiology results: 1/30 Sputum: Sent  1/30 MRSA PCR: Ordered  Thank you for allowing pharmacy to be a part of this patient's care.  Lenis Noon, PharmD Clinical Pharmacist 08/23/2016 6:38 PM

## 2016-08-23 NOTE — ED Provider Notes (Signed)
[facility] Emergency Department Provider Note        Time seen: ----------------------------------------- 11:59 AM on 08/23/2016 -----------------------------------------    I have reviewed the triage vital signs and the nursing notes.   HISTORY  Chief Complaint Fall    HPI Tina [name] is a 83 y.o. female who presents to the ER from [place] after an unwitnessed fall today. Patient got up out of the wheelchair, got dizzy and went to sit down and she missed the wheelchair. She was unsure of why she fell. She is complaining of back pain, head pain and left hip pain. She denies fevers or chills, was noted to be hypoxic on room air with a marginal blood pressure.   Past Medical History:  Diagnosis Date  . Anemia   . Anxiety   . Bronchitis   . CAD (coronary artery disease)    s/p PCI  . CKD stage 3 due to type 2 diabetes mellitus ([facility])   . Colon cancer ([place])    treated, surgery, chemo, radiation  . COPD (chronic obstructive pulmonary disease) (HCC)    on 2L home o2  . Diabetes mellitus without complication ([place])   . Obesity   . Osteoarthritis   . Renal disorder     Patient Active Problem List   Diagnosis Date Noted  . Pressure ulcer 08/26/2015  . CHF (congestive heart failure) ([place]) 08/25/2015    Past Surgical History:  Procedure Laterality Date  . ABDOMINAL HYSTERECTOMY    . CATARACT EXTRACTION, BILATERAL    . PARTIAL COLECTOMY      Allergies Corn-containing products  Social History Social History  Substance Use Topics  . Smoking status: Former Research scientist (life sciences)  . Smokeless tobacco: Not on file     Comment: quit 20 years ago  . Alcohol use No    Review of Systems Constitutional: Negative for fever. Cardiovascular: Negative for chest pain. Respiratory: Negative for shortness of breath. Gastrointestinal: Negative for abdominal pain, vomiting and diarrhea. Genitourinary: Negative for dysuria. Musculoskeletal: Positive for back  pain, hip pain, head pain Skin: Negative for rash. Neurological: Negative for headaches, positive for generalized weakness  10-point ROS otherwise negative.  ____________________________________________   PHYSICAL EXAM:  VITAL SIGNS: ED Triage Vitals [08/23/16 1147]  Enc Vitals Group     BP      Pulse Rate 78     Resp (!) 21     Temp 97.7 F (36.5 C)     Temp Source Oral     SpO2      Weight      Height      Head Circumference      Peak Flow      Pain Score      Pain Loc      Pain Edu?      Excl. in [place]?     Constitutional: Alert and oriented. Well appearing and in no distress. Eyes: Conjunctivae are normal. PERRL. Normal extraocular movements. ENT   Head: Normocephalic and atraumatic.   Nose: No congestion/rhinnorhea.   Mouth/Throat: Mucous membranes are moist.   Neck: No stridor. Cardiovascular: Normal rate, regular rhythm. No murmurs, rubs, or gallops. Respiratory: Normal respiratory effort without tachypnea nor retractions. Breath sounds are clear and equal bilaterally. No wheezes/rales/rhonchi. Gastrointestinal: Soft and nontender. Normal bowel sounds Musculoskeletal: Nontender with normal range of motion in all extremities. No lower extremity tenderness nor edema. Neurologic:  Normal speech and language. No gross focal neurologic deficits are appreciated.  Skin:  Skin  is warm, dry and intact. No rash noted. Psychiatric: Mood and affect are normal. Speech and behavior are normal.  ____________________________________________  EKG: Interpreted by me.Sinus rhythm rate of 70 bpm, normal PR interval, normal QRS, normal QT, incomplete right bundle branch block.  ____________________________________________  ED COURSE:  Pertinent labs & imaging results that were available during my care of the patient were reviewed by me and considered in my medical decision making (see chart for details). Patient presents to the ER status post unwitnessed fall. We will  assess with labs and imaging. She'll receive IV fluids.   Procedures ____________________________________________   LABS (pertinent positives/negatives)  Labs Reviewed  CBC - Abnormal; Notable for the following:       Result Value   WBC 20.9 (*)    RDW 16.3 (*)    All other components within normal limits  BASIC METABOLIC PANEL - Abnormal; Notable for the following:    Sodium 133 (*)    Chloride 96 (*)    Glucose, Bld 146 (*)    BUN 21 (*)    Creatinine, Ser 1.49 (*)    GFR calc non Af Amer 31 (*)    GFR calc Af Amer 35 (*)    All other components within normal limits  URINALYSIS, COMPLETE (UACMP) WITH MICROSCOPIC - Abnormal; Notable for the following:    Color, Urine YELLOW (*)    APPearance CLEAR (*)    Hgb urine dipstick SMALL (*)    Squamous Epithelial / LPF 0-5 (*)    All other components within normal limits  APTT  PROTIME-INR    RADIOLOGY Images were viewed by me  Chest x-ray, pelvis, lumbar spine x-rays CT head IMPRESSION: Mild left mid lung field infiltrate. Mild bibasilar subsegmental atelectasis.  2. Cardiomegaly.  3. No acute bony abnormality identified. No pneumothorax . ____________________________________________  FINAL ASSESSMENT AND PLAN  Fall, weakness, Pneumonia  Plan: Patient with labs and imaging as dictated above. Patient presented to the ER after an unwitnessed fall. She was found to have leukocytosis whose only source could be identified as pneumonia. I will start her typical antibiotics for healthcare associated pneumonia. I will discuss with the hospitalist for admission.   Earleen Newport, MD   Note: This note was generated in part or whole with voice recognition software. Voice recognition is usually quite accurate but there are transcription errors that can and very often do occur. I apologize for any typographical errors that were not detected and corrected.     Earleen Newport, MD 08/23/16 [phone]

## 2016-08-23 NOTE — ED Notes (Signed)
Pt provided diabetic [place] sandwich tray and water to drink.

## 2016-08-24 ENCOUNTER — Inpatient Hospital Stay: Payer: Medicare Other

## 2016-08-24 LAB — CBC
HCT: 37 % (ref 35.0–47.0)
Hemoglobin: 11.8 g/dL — ABNORMAL LOW (ref 12.0–16.0)
MCH: 28.1 pg (ref 26.0–34.0)
MCHC: 31.7 g/dL — ABNORMAL LOW (ref 32.0–36.0)
MCV: 88.6 fL (ref 80.0–100.0)
PLATELETS: 186 10*3/uL (ref 150–440)
RBC: 4.18 MIL/uL (ref 3.80–5.20)
RDW: 16.5 % — ABNORMAL HIGH (ref 11.5–14.5)
WBC: 13.1 10*3/uL — ABNORMAL HIGH (ref 3.6–11.0)

## 2016-08-24 LAB — BASIC METABOLIC PANEL
Anion gap: 7 (ref 5–15)
BUN: 18 mg/dL (ref 6–20)
CALCIUM: 9.1 mg/dL (ref 8.9–10.3)
CO2: 32 mmol/L (ref 22–32)
CREATININE: 1.4 mg/dL — AB (ref 0.44–1.00)
Chloride: 96 mmol/L — ABNORMAL LOW (ref 101–111)
GFR calc non Af Amer: 33 mL/min — ABNORMAL LOW (ref >60)
GFR, EST AFRICAN AMERICAN: 38 mL/min — AB (ref >60)
Glucose, Bld: 109 mg/dL — ABNORMAL HIGH (ref 65–99)
Potassium: 3.7 mmol/L (ref 3.5–5.1)
SODIUM: 135 mmol/L (ref 135–145)

## 2016-08-24 LAB — TROPONIN I
TROPONIN I: 0.17 ng/mL — AB (ref <0.03)
Troponin I: <0.03 ng/mL (ref <0.03)

## 2016-08-24 LAB — HEMOGLOBIN A1C
HEMOGLOBIN A1C: 5.6 % (ref 4.8–5.6)
Mean Plasma Glucose: 114 mg/dL

## 2016-08-24 LAB — GLUCOSE, CAPILLARY
Glucose-Capillary: 110 mg/dL — ABNORMAL HIGH (ref 65–99)
Glucose-Capillary: 104 mg/dL — ABNORMAL HIGH (ref 65–99)
Glucose-Capillary: 114 mg/dL — ABNORMAL HIGH (ref 65–99)
Glucose-Capillary: 125 mg/dL — ABNORMAL HIGH (ref 65–99)

## 2016-08-24 MED ORDER — ORAL CARE MOUTH RINSE
15.0000 mL | Freq: Two times a day (BID) | OROMUCOSAL | Status: DC
  Administered 2016-08-25 – 2016-08-26 (×3): 15 mL via OROMUCOSAL

## 2016-08-24 MED ORDER — ASPIRIN EC 81 MG PO TBEC
81.0000 mg | DELAYED_RELEASE_TABLET | Freq: Every day | ORAL | Status: DC
  Administered 2016-08-25 – 2016-08-26 (×2): 81 mg via ORAL
  Filled 2016-08-24 (×2): qty 1

## 2016-08-24 MED ORDER — ASPIRIN EC 325 MG PO TBEC
325.0000 mg | DELAYED_RELEASE_TABLET | Freq: Once | ORAL | Status: AC
  Administered 2016-08-24: 325 mg via ORAL

## 2016-08-24 MED ORDER — ENOXAPARIN SODIUM 40 MG/0.4ML ~~LOC~~ SOLN
40.0000 mg | SUBCUTANEOUS | Status: DC
  Administered 2016-08-25 – 2016-08-26 (×2): 40 mg via SUBCUTANEOUS
  Filled 2016-08-24 (×2): qty 0.4

## 2016-08-24 MED ORDER — ENOXAPARIN SODIUM 120 MG/0.8ML ~~LOC~~ SOLN
1.0000 mg/kg | Freq: Once | SUBCUTANEOUS | Status: AC
  Administered 2016-08-24: 105 mg via SUBCUTANEOUS
  Filled 2016-08-24: qty 0.8

## 2016-08-24 MED ORDER — CHLORHEXIDINE GLUCONATE 0.12 % MT SOLN
15.0000 mL | Freq: Two times a day (BID) | OROMUCOSAL | Status: DC
  Administered 2016-08-24 – 2016-08-26 (×4): 15 mL via OROMUCOSAL
  Filled 2016-08-24 (×5): qty 15

## 2016-08-24 MED ORDER — MORPHINE SULFATE (PF) 2 MG/ML IV SOLN: 2.0000 mg | INTRAVENOUS | Status: DC | PRN

## 2016-08-24 MED ORDER — ASPIRIN EC 325 MG PO TBEC
325.0000 mg | DELAYED_RELEASE_TABLET | Freq: Every day | ORAL | Status: DC
  Filled 2016-08-24: qty 1

## 2016-08-24 MED ORDER — SODIUM CHLORIDE 0.9 % IV SOLN
INTRAVENOUS | Status: DC
  Administered 2016-08-24: 19:00:00 via INTRAVENOUS

## 2016-08-24 MED ORDER — OXYCODONE HCL 5 MG PO TABS
5.0000 mg | ORAL_TABLET | ORAL | Status: DC | PRN
  Administered 2016-08-24 – 2016-08-25 (×4): 5 mg via ORAL
  Filled 2016-08-24 (×4): qty 1

## 2016-08-24 MED ORDER — ASPIRIN EC 81 MG PO TBEC: 81.0000 mg | DELAYED_RELEASE_TABLET | Freq: Every day | ORAL | Status: DC

## 2016-08-24 NOTE — Progress Notes (Signed)
Will place order for patient to be NPO after midnight so patient can have abdominal ultrasound

## 2016-08-24 NOTE — Progress Notes (Signed)
Per Dr. Margaretmary Eddy okay to place pt back on a dysphagia III diet.

## 2016-08-24 NOTE — Progress Notes (Signed)
Per MD okay to give oral meds.

## 2016-08-24 NOTE — Progress Notes (Signed)
Per Dr. Margaretmary Eddy place order for troponin 1 time stat. Do not eat until results back. Do not give meal coverage

## 2016-08-24 NOTE — Evaluation (Signed)
Physical Therapy Evaluation Patient Details Name: Tina [name] MRN: [id number] DOB: 03-May-1928 Today's Date: 08/24/2016   History of Present Illness  Pt is an 83 y.o. female s/p unwitnessed fall (got out of wheelchair, got dizzy, went to sit down and missed w/c).  Pt admitted to hospital with healthcare associated PNA, LE swelling, hypotension, hyponatremia, and fluid overloaded.  PMH includes anemia, anxiety, bronchitis, CAD, CKD, colon CA, COPD, 2 L home O2, DM, CHF.  Clinical Impression  Prior to hospital admission, pt reports requiring assist with bed mobility but transferred to w/c by herself (holding onto items) and would push self in w/c with B UE's.  Pt inconsistent during session with statements and confusion noted so unsure of accuracy of pt's reports of PLOF.  Per chart pt lives at [facility].  Currently pt is mod to max assist with logrolling in bed (limited mobility d/t pt's 8/10 back pain and pt declining attempt to get to chair).  Pt would benefit from skilled PT to address noted impairments and functional limitations.  Recommend pt discharge back to prior facility with 24/7 assist and HHPT when medically appropriate.    Follow Up Recommendations Supervision/Assistance - 24 hour;Home health PT    Equipment Recommendations  None recommended by PT    Recommendations for Other Services       Precautions / Restrictions Precautions Precautions: Fall Restrictions Weight Bearing Restrictions: No      Mobility  Bed Mobility Overal bed mobility: Needs Assistance Bed Mobility: Rolling Rolling: Mod assist;Max assist         General bed mobility comments: logrolling to L x2 trials to place bed pan; pt able to reach towards L siderail with B UE's to pull self but required assist to initiate and finish logrolling to get on/off bed pan  Transfers                 General transfer comment: Pt declined OOB mobility  Ambulation/Gait             General Gait  Details: Pt w/c level baseline  Stairs            Wheelchair Mobility    Modified Rankin (Stroke Patients Only)       Balance                                             Pertinent Vitals/Pain Pain Assessment: 0-10 Pain Score: 8  Pain Location: mid back (although pt earlier reporting abdominal pain instead to nursing) Pain Descriptors / Indicators: Discomfort;Tender;Sore Pain Intervention(s): Limited activity within patient's tolerance;Monitored during session;Repositioned  Pt initially with nasal cannula off when PT entered room and O2 sats 87% so pt placed back on 2 L and O2 sats 95% (nursing present and aware).    Home Living Family/patient expects to be discharged to:: Skilled nursing facility                 Additional Comments: Lives at [facility].    Prior Function Level of Independence: Needs assistance   Gait / Transfers Assistance Needed: Pt reports she does not walk and requires assist for bed mobility but not for transfers (just holds onto something).           Hand Dominance        Extremity/Trunk Assessment   Upper Extremity Assessment Upper Extremity Assessment: Difficult to  assess due to impaired cognition    Lower Extremity Assessment Lower Extremity Assessment: Difficult to assess due to impaired cognition       Communication   Communication: No difficulties  Cognition Arousal/Alertness: Awake/alert Behavior During Therapy: Anxious;Impulsive Overall Cognitive Status: No family/caregiver present to determine baseline cognitive functioning (Oriented to person and in hospital (not to time/situation))                      General Comments   Nursing cleared pt for participation in physical therapy.  Pt pulling off her telemetry leads upon PT entering room (PT notified nursing who came to assist pt with placing leads back on).    Exercises     Assessment/Plan    PT Assessment Patient needs  continued PT services  PT Problem List Decreased strength;Decreased activity tolerance;Decreased mobility;Pain;Decreased safety awareness          PT Treatment Interventions DME instruction;Functional mobility training;Therapeutic activities;Therapeutic exercise;Balance training;Patient/family education    PT Goals (Current goals can be found in the Care Plan section)  Acute Rehab PT Goals Patient Stated Goal: to have less pain PT Goal Formulation: With patient Time For Goal Achievement: 09/07/16 Potential to Achieve Goals: Fair    Frequency Min 2X/week   Barriers to discharge        Co-evaluation               End of Session Equipment Utilized During Treatment: Oxygen Activity Tolerance: Patient limited by pain Patient left: in bed;with call bell/phone within reach;with bed alarm set Nurse Communication: Mobility status;Precautions (Pt's pain status)         Time: 6440-3474 PT Time Calculation (min) (ACUTE ONLY): 23 min   Charges:   PT Evaluation $PT Eval Low Complexity: 1 Procedure     PT G CodesLeitha Bleak 2016/09/12, 4:16 PM Leitha Bleak, [place]

## 2016-08-24 NOTE — Evaluation (Signed)
Clinical/Bedside Swallow Evaluation Patient Details  Name: Tina [name] MRN: [id number] Date of Birth: September 14, 1927  Today's Date: 08/24/2016 Time: SLP Start Time (ACUTE ONLY): 1030 SLP Stop Time (ACUTE ONLY): 1130 SLP Time Calculation (min) (ACUTE ONLY): 60 min  Past Medical History:  Past Medical History:  Diagnosis Date  . Anemia   . Anxiety   . Bronchitis   . CAD (coronary artery disease)    s/p PCI  . CKD stage 3 due to type 2 diabetes mellitus ([place])   . Colon cancer ([place])    treated, surgery, chemo, radiation  . COPD (chronic obstructive pulmonary disease) ([facility])    on 2L home o2  . Diabetes mellitus without complication ([place])   . Obesity   . Osteoarthritis   . Renal disorder    Past Surgical History:  Past Surgical History:  Procedure Laterality Date  . ABDOMINAL HYSTERECTOMY    . CATARACT EXTRACTION, BILATERAL    . PARTIAL COLECTOMY     HPI:  Pt is a 83 y.o. female with a known history of CHF, diabetes, CAD stage III, obesity, COPD, chronic respiratory failure, supposed to be on oxygen therapy, however, refuses, who presents to the hospital with complaints of fatigue, weakness, cough, unknown period of time, worsening lower extremity swelling, wheezing, white phlegm production, shortness of breath. On arrival to the hospital patient was noted to the hypotensive, systolic blood pressure of 96, in atrial fibrillation, controlled rate of 76, tachypneic, labs revealed an elevated white blood cell count of almost 21,000, chest x-ray revealed left middle lobe pneumonia. Hospitalist services were contacted for admission. Patient not able to provide much history due to dementia, although admits of some left-sided chest pains, shortness of breath, cough, wheezing, white phlegm production for unknown period of time. Currently, pt with complains of stomach and back aching- SLP not able to raise HOB to 90 degrees (closer to 80-85 degrees for PO intake) Pt alert and responsive, but a bit  confused. She repeated mutliple things- "I haven't eaten anything all day", "I feel like I'm pregnant, my stomach hurts". Pt also confused on how long she has been in hospital.    Assessment / Plan / Recommendation Clinical Impression  Pt appears to adequately tolerate dysphagia level 2 diet with thin liquids following general aspiration precautions. Upon enterance, pt mild, inconsistent coughing, moving about in bed before po intake, after trials of thins and mech soft, pt with cough X2, also noted but do not suspect it was related to s/sx of aspiration d/t reports of baseline cough and cough before po trials. No oral phase deficits noted (only few trials given); control of bolus, no spillage noted. Pt education provided on general aspiration precautions (sitting upright during all po intake, small sips and bites, eating slowly). Education also provided on oral mouthcare kit, kit left in room. Recommend dys 2 diet (d/t pt with no dentition) with thin liquids following aspiration precautions. Recommend supervision/montioring at meals d/t declined conginitve status/dementia dx per chart review.     Aspiration Risk   (reduced)    Diet Recommendation   Dysphagia 2 diet (chopped) with thin liquids following aspiration precautions. Monitoring/supervision with meals. Meds given in puree.   Medication Administration: Whole meds with puree    Other  Recommendations Recommended Consults:  (dietitician) Oral Care Recommendations: Oral care BID;Patient independent with oral care   Follow up Recommendations  (TBD)      Frequency and Duration min 2x/week  1 week  Prognosis Prognosis for Safe Diet Advancement: Good Barriers to Reach Goals: Cognitive deficits (confusion)      Swallow Study   General Date of Onset: 08/23/16 HPI: Pt is a 83 y.o. female with a known history of CHF, diabetes, CAD stage III, obesity, COPD, chronic respiratory failure, supposed to be on oxygen therapy, however,  refuses, who presents to the hospital with complaints of fatigue, weakness, cough, unknown period of time, worsening lower extremity swelling, wheezing, white phlegm production, shortness of breath. On arrival to the hospital patient was noted to the hypotensive, systolic blood pressure of 96, in atrial fibrillation, controlled rate of 76, tachypneic, labs revealed an elevated white blood cell count of almost 21,000, chest x-ray revealed left middle lobe pneumonia. Hospitalist services were contacted for admission. Patient not able to provide much history due to dementia, although admits of some left-sided chest pains, shortness of breath, cough, wheezing, white phlegm production for unknown period of time. Currently, pt with complains of stomach and back aching- SLP not able to raise HOB to 90 degrees (closer to 80-85 degrees for PO intake) Pt alert and responsive, but a bit confused. She repeated mutliple things- "I haven't eaten anything all day", "I feel like I'm pregnant, my stomach hurts". Pt also confused on how long she has been in hospital.  Type of Study: Bedside Swallow Evaluation Previous Swallow Assessment: none noted Diet Prior to this Study: Regular;Thin liquids (pt reports eating soft foods d/t no dentures or teeth ) Temperature Spikes Noted: No Respiratory Status: Nasal cannula History of Recent Intubation: No Behavior/Cognition: Alert;Cooperative;Confused;Pleasant mood;Requires cueing Oral Cavity Assessment: Within Functional Limits Oral Care Completed by SLP: Recent completion by staff Oral Cavity - Dentition: Missing dentition (reports teeth pulled ~age 89, not able to wear dentures ) Vision: Functional for self-feeding (wears glasses) Self-Feeding Abilities: Able to feed self;Needs assist;Needs set up Patient Positioning: Upright in bed;Postural control adequate for testing (complaints of back hurting, not able to sit 90 degrees (~80)) Baseline Vocal Quality: Normal Volitional  Cough: Strong (mod strong- pt reports baseline cough) Volitional Swallow:  (dnt)    Oral/Motor/Sensory Function Overall Oral Motor/Sensory Function: Within functional limits   Ice Chips Ice chips: Within functional limits Presentation: Spoon (X1 trial)   Thin Liquid Thin Liquid: Within functional limits Presentation: Cup;Self Fed (X4 trials )    Nectar Thick Nectar Thick Liquid: Not tested   Honey Thick Honey Thick Liquid: Not tested   Puree Puree: Within functional limits Presentation: Spoon (X3 trials)   Solid   GO   Solid: Within functional limits Presentation:  (softened solids X1 trial-refused add. po d/t stomach pain) Other Comments: Pt did cough X1 with trials post swallow, doubt related to swlallow as pt coughing upon enterance to room        Eulogio Ditch, B.S Graduate Clinician  08/24/2016,12:43 PM   This information has been reviewed and agreed upon by this supervising clinician.  Orinda Kenner, [place], CCC-SLP

## 2016-08-24 NOTE — Progress Notes (Addendum)
[facility] [place] NAME: Tina [name]    MR#:  [id number]  DATE OF BIRTH:  Aug 19, 1927  SUBJECTIVE:  CHIEF COMPLAINT:  Patient is complaining of back pain and abdominal pain. Denies any chest pain or shortness of breath  REVIEW OF SYSTEMS:  CONSTITUTIONAL: No fever, fatigue or weakness.  EYES: No blurred or double vision.  EARS, NOSE, AND THROAT: No tinnitus or ear pain.  RESPIRATORY: No cough, shortness of breath, wheezing or hemoptysis.  CARDIOVASCULAR: No chest pain, orthopnea, edema.  GASTROINTESTINAL: No nausea, vomiting, diarrhea . Reporting abdominal pain.  GENITOURINARY: No dysuria, hematuria.  ENDOCRINE: No polyuria, nocturia,  HEMATOLOGY: No anemia, easy bruising or bleeding SKIN: No rash or lesion. MUSCULOSKELETAL: No joint pain or arthritis.  Reporting back pain NEUROLOGIC: No tingling, numbness, weakness.  PSYCHIATRY: No anxiety or depression.   DRUG ALLERGIES:   Allergies  Allergen Reactions  . Corn-Containing Products Other (See Comments)    Reaction:  Unknown     VITALS:  Blood pressure (!) 120/58, pulse 89, temperature 98 F (36.7 C), temperature source Oral, resp. rate 18, height 5\' 7"  (1.702 m), weight 104.6 kg (230 lb 11.2 oz), SpO2 97 %.  PHYSICAL EXAMINATION:  GENERAL:  83 y.o.-year-old patient lying in the bed with no acute distress.  EYES: Pupils equal, round, reactive to light and accommodation. No scleral icterus. Extraocular muscles intact.  HEENT: Head atraumatic, normocephalic. Oropharynx and nasopharynx clear.  NECK:  Supple, no jugular venous distention. No thyroid enlargement, no tenderness.  LUNGS: Mod  breath sounds bilaterally, no wheezing, rales,rhonchi or crepitation. No use of accessory muscles of respiration.  CARDIOVASCULAR: S1, S2 normal. No murmurs, rubs, or gallops.  ABDOMEN: Soft, nontender, nondistended. Bowel sounds present. No organomegaly or mass.  EXTREMITIES: No pedal  edema, cyanosis, or clubbing.  NEUROLOGIC: Cranial nerves II through XII are intact. Muscle strength 5/5 in all extremities. Sensation intact. Gait not checked.  PSYCHIATRIC: The patient is alert and oriented x 3.  SKIN: No obvious rash, lesion, or ulcer.    LABORATORY PANEL:   CBC  Recent Labs Lab 08/24/16 0521  WBC 13.1*  HGB 11.8*  HCT 37.0  PLT 186   ------------------------------------------------------------------------------------------------------------------  Chemistries   Recent Labs Lab 08/24/16 0521  NA 135  K 3.7  CL 96*  CO2 32  GLUCOSE 109*  BUN 18  CREATININE 1.40*  CALCIUM 9.1   ------------------------------------------------------------------------------------------------------------------  Cardiac Enzymes  Recent Labs Lab 08/24/16 1317  TROPONINI <0.03   ------------------------------------------------------------------------------------------------------------------  RADIOLOGY:  Dg Chest 1 View  Result Date: 08/23/2016 CLINICAL DATA:  Unwitnessed fall. EXAM: CHEST 1 VIEW COMPARISON:  08/27/2015 . FINDINGS: Mediastinum and hilar structures normal. Cardiomegaly. Mild infiltrate left mid lung field cannot be excluded. Mild bibasilar atelectasis noted. No pleural effusion or pneumothorax. No acute bony abnormality identified. IMPRESSION: Mild left mid lung field infiltrate. Mild bibasilar subsegmental atelectasis. 2. Cardiomegaly. 3. No acute bony abnormality identified.  No pneumothorax . Electronically Signed   By: Marcello Moores  Register   On: 08/23/2016 14:06   Dg Lumbar Spine 2-3 Views  Result Date: 08/23/2016 CLINICAL DATA:  Recent fall with low back pain, initial encounter EXAM: LUMBAR SPINE - 2-3 VIEW COMPARISON:  None. FINDINGS: Five lumbar type vertebral bodies are well visualized. Vertebral body height is well maintained. No anterolisthesis is noted. Multilevel disc space narrowing is seen. Dilatation of the distal abdominal aorta is noted but  incompletely evaluated on this exam. IMPRESSION: Degenerative change without acute abnormality.  Electronically Signed   By: Inez Catalina M.D.   On: 08/23/2016 13:08   Ct Head Wo Contrast  Result Date: 08/23/2016 CLINICAL DATA:  Unwitnessed fall. EXAM: CT HEAD WITHOUT CONTRAST TECHNIQUE: Contiguous axial images were obtained from the base of the skull through the vertex without intravenous contrast. COMPARISON:  04/08/2014 FINDINGS: Brain: Expected cerebral volume loss for age. Resultant prominence of the extra-axial spaces adjacent the frontal lobes. No mass lesion, hemorrhage, hydrocephalus, acute infarct, intra-axial, or extra-axial fluid collection. Vascular: No hyperdense vessel or unexpected calcification. Skull: No significant soft tissue swelling. Hyperostosis frontalis interna. Sinuses/Orbits: Normal orbits and globes. Partial opacification of left mastoid air cells is chronic. Other paranasal sinuses are clear. Other: None IMPRESSION: 1.  No acute intracranial abnormality. 2. Partial left mastoid opacification, similar. Electronically Signed   By: Abigail Miyamoto M.D.   On: 08/23/2016 12:55   US Venous Img Lower Bilateral  Result Date: 08/24/2016 CLINICAL DATA:  Swelling. EXAM: BILATERAL LOWER EXTREMITY VENOUS DOPPLER ULTRASOUND TECHNIQUE: Gray-scale sonography with graded compression, as well as color Doppler and duplex ultrasound were performed to evaluate the lower extremity deep venous systems from the level of the common femoral vein and including the common femoral, femoral, profunda femoral, popliteal and calf veins including the posterior tibial, peroneal and gastrocnemius veins when visible. The superficial great saphenous vein was also interrogated. Spectral Doppler was utilized to evaluate flow at rest and with distal augmentation maneuvers in the common femoral, femoral and popliteal veins. COMPARISON:  No prior. FINDINGS: RIGHT LOWER EXTREMITY Common Femoral Vein: No evidence of thrombus.  Normal compressibility, respiratory phasicity and response to augmentation. Saphenofemoral Junction: No evidence of thrombus. Normal compressibility and flow on color Doppler imaging. Profunda Femoral Vein: No evidence of thrombus. Normal compressibility and flow on color Doppler imaging. Femoral Vein: No evidence of thrombus. Normal compressibility, respiratory phasicity and response to augmentation. Popliteal Vein: No evidence of thrombus. Normal compressibility, respiratory phasicity and response to augmentation. Calf Veins: No evidence of thrombus. Normal compressibility and flow on color Doppler imaging. Superficial Great Saphenous Vein: No evidence of thrombus. Normal compressibility and flow on color Doppler imaging. Other Findings:  None. LEFT LOWER EXTREMITY Common Femoral Vein: No evidence of thrombus. Normal compressibility, respiratory phasicity and response to augmentation. Saphenofemoral Junction: No evidence of thrombus. Normal compressibility and flow on color Doppler imaging. Profunda Femoral Vein: No evidence of thrombus. Normal compressibility and flow on color Doppler imaging. Femoral Vein: No evidence of thrombus. Normal compressibility, respiratory phasicity and response to augmentation. Popliteal Vein: No evidence of thrombus. Normal compressibility, respiratory phasicity and response to augmentation. Calf Veins: No evidence of thrombus. Normal compressibility and flow on color Doppler imaging. Superficial Great Saphenous Vein: No evidence of thrombus. Normal compressibility and flow on color Doppler imaging. Other Findings:  None. IMPRESSION: No evidence of deep venous thrombosis. Electronically Signed   By: Marcello Moores  Register   On: 08/24/2016 12:51   Dg Hip Unilat With Pelvis 2-3 Views Left  Result Date: 08/23/2016 CLINICAL DATA:  from [place] following an unwitnessed fall. Pt got up out of wheelchair, got dizzy, went to sit down and missed wheelchair, Pt c/o low back pain, head  pain, L hip pain EXAM: DG HIP (WITH OR WITHOUT PELVIS) 2-3V LEFT COMPARISON:  None. FINDINGS: No fracture.  No dislocation. There are advanced arthropathic changes of the left hip with severe superior joint space narrowing, mild subchondral sclerosis and subchondral cystic change. Small marginal osteophytes noted from the base of the left femoral  head. There is mild axial right hip joint space narrowing. The SI joints and symphysis pubis are normally spaced and aligned. The bones are diffusely demineralized. Soft tissues are unremarkable. IMPRESSION: 1. No fracture, dislocation or acute finding. 2. 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 Electronically Signed   By: Lajean Manes M.D.   On: 08/23/2016 13:03    EKG:   Orders placed or performed during the hospital encounter of 08/23/16  . ED EKG  . ED EKG  . EKG 12-Lead  . EKG 12-Lead  . EKG 12-Lead  . EKG 12-Lead  . EKG 12-Lead  . EKG 12-Lead  . EKG 12-Lead  . EKG 12-Lead    ASSESSMENT AND PLAN:   #. Healthcare associated pneumonia in left mid lobe  continue broad-spectrum antibiotic therapy with Zosyn Vancomycin discontinued as MRSA PCR negative , get sputum cultures if possible, continue nebulizing therapy, steroids  #. Lower extremity swelling, ruled out DVT by Doppler's  #. Hypotension, resolved with IV fluids,  history ofDiastolic CHF, monitor for symptoms and signs of fluid overload Echocardiogram in February 2017 with 60-65% of left ventricular ejection fraction  #Acute kidney injury Gentle hydration with IV fluids. Creatinine at 1.4  #Elevated troponin secondary to supply demand ischemia EKG no acute changes Serial troponin is less than 0.0 3--.17 and then 0.03 Cardiology consult is placed  #Generalized abdominal pain Abdominal ultrasound ordered  #Acute low back pain-x-rays normal pain management as needed  # Hyponatremia and fluid overloaded patient,  continue Lasix  follow sodium level 135  #.  Generalized weakness, PT recommending home health PT is 24 hour supervision   #. Atrial fibrillation  Rate controlled  PT recommending home health PT is 24 hour supervision All the records are reviewed and case discussed with Care Management/Social Workerr. Management plans discussed with the patient, daughter over phone and they are in agreement.  CODE STATUS: fc   TOTAL TIME TAKING CARE OF THIS PATIENT: 36  minutes.   POSSIBLE D/C IN 2 DAYS, DEPENDING ON CLINICAL CONDITION.  Note: This dictation was prepared with Dragon dictation along with smaller phrase technology. Any transcriptional errors that result from this process are unintentional.   [name] M.D on 08/24/2016 at 5:26 PM  Between 7am to 6pm - Pager - [phone] After 6pm go to [email or web address] - password [email or web address] [facility]  [facility]  Office  [phone]  CC: Primary care physician; No primary care provider on file.

## 2016-08-25 ENCOUNTER — Inpatient Hospital Stay: Payer: Medicare Other

## 2016-08-25 LAB — GLUCOSE, CAPILLARY
GLUCOSE-CAPILLARY: 117 mg/dL — AB (ref 65–99)
GLUCOSE-CAPILLARY: 116 mg/dL — AB (ref 65–99)
GLUCOSE-CAPILLARY: 106 mg/dL — AB (ref 65–99)
GLUCOSE-CAPILLARY: 130 mg/dL — AB (ref 65–99)
Glucose-Capillary: 105 mg/dL — ABNORMAL HIGH (ref 65–99)

## 2016-08-25 LAB — BASIC METABOLIC PANEL
Anion gap: 6 (ref 5–15)
BUN: 15 mg/dL (ref 6–20)
CO2: 32 mmol/L (ref 22–32)
Calcium: 9.3 mg/dL (ref 8.9–10.3)
Chloride: 96 mmol/L — ABNORMAL LOW (ref 101–111)
Creatinine, Ser: 1.11 mg/dL — ABNORMAL HIGH (ref 0.44–1.00)
GFR calc Af Amer: 51 mL/min — ABNORMAL LOW (ref >60)
GFR, EST NON AFRICAN AMERICAN: 44 mL/min — AB (ref >60)
Glucose, Bld: 105 mg/dL — ABNORMAL HIGH (ref 65–99)
POTASSIUM: 3.7 mmol/L (ref 3.5–5.1)
SODIUM: 134 mmol/L — AB (ref 135–145)

## 2016-08-25 LAB — PROCALCITONIN: PROCALCITONIN: 0.2 ng/mL

## 2016-08-25 MED ORDER — GI COCKTAIL ~~LOC~~
30.0000 mL | Freq: Three times a day (TID) | ORAL | Status: DC | PRN
  Filled 2016-08-25: qty 30

## 2016-08-25 NOTE — Progress Notes (Signed)
Per Dr. Margaretmary Eddy okay to place patient on a Dysphagia III diet.

## 2016-08-25 NOTE — Progress Notes (Signed)
[facility] [place] NAME: Tina [name]    MR#:  [id number]  DATE OF BIRTH:  August 07, 1927  SUBJECTIVE:  CHIEF COMPLAINT:  Patient is feeling weak and coughing, denies any abd pain   REVIEW OF SYSTEMS:  CONSTITUTIONAL: No fever, fatigue or weakness.  EYES: No blurred or double vision.  EARS, NOSE, AND THROAT: No tinnitus or ear pain.  RESPIRATORY: some cough,  No  shortness of breath, wheezing or hemoptysis.  CARDIOVASCULAR: No chest pain, orthopnea, edema.  GASTROINTESTINAL: No nausea, vomiting, diarrhea . Reporting abdominal pain.  GENITOURINARY: No dysuria, hematuria.  ENDOCRINE: No polyuria, nocturia,  HEMATOLOGY: No anemia, easy bruising or bleeding SKIN: No rash or lesion. MUSCULOSKELETAL: No joint pain or arthritis.  Reporting back pain NEUROLOGIC: No tingling, numbness, weakness.  PSYCHIATRY: No anxiety or depression.   DRUG ALLERGIES:   Allergies  Allergen Reactions  . Corn-Containing Products Other (See Comments)    Reaction:  Unknown     VITALS:  Blood pressure 130/71, pulse (!) 54, temperature 98.1 F (36.7 C), temperature source Oral, resp. rate 17, height 5\' 7"  (1.702 m), weight 104.6 kg (230 lb 11.2 oz), SpO2 91 %.  PHYSICAL EXAMINATION:  GENERAL:  83 y.o.-year-old patient lying in the bed with no acute distress.  EYES: Pupils equal, round, reactive to light and accommodation. No scleral icterus. Extraocular muscles intact.  HEENT: Head atraumatic, normocephalic. Oropharynx and nasopharynx clear.  NECK:  Supple, no jugular venous distention. No thyroid enlargement, no tenderness.  LUNGS: Mod  breath sounds bilaterally, no wheezing, rales,rhonchi or crepitation. No use of accessory muscles of respiration.  CARDIOVASCULAR: S1, S2 normal. No murmurs, rubs, or gallops.  ABDOMEN: Soft, nontender, nondistended. Bowel sounds present. No organomegaly or mass.  EXTREMITIES: No pedal edema, cyanosis, or clubbing.   NEUROLOGIC: Cranial nerves II through XII are intact. Muscle strength 5/5 in all extremities. Sensation intact. Gait not checked.  PSYCHIATRIC: The patient is alert and oriented x 3.  SKIN: No obvious rash, lesion, or ulcer.    LABORATORY PANEL:   CBC  Recent Labs Lab 08/24/16 0521  WBC 13.1*  HGB 11.8*  HCT 37.0  PLT 186   ------------------------------------------------------------------------------------------------------------------  Chemistries   Recent Labs Lab 08/25/16 0449  NA 134*  K 3.7  CL 96*  CO2 32  GLUCOSE 105*  BUN 15  CREATININE 1.11*  CALCIUM 9.3   ------------------------------------------------------------------------------------------------------------------  Cardiac Enzymes  Recent Labs Lab 08/24/16 1317  TROPONINI <0.03   ------------------------------------------------------------------------------------------------------------------  RADIOLOGY:  Dg Chest 1 View  Result Date: 08/23/2016 CLINICAL DATA:  Unwitnessed fall. EXAM: CHEST 1 VIEW COMPARISON:  08/27/2015 . FINDINGS: Mediastinum and hilar structures normal. Cardiomegaly. Mild infiltrate left mid lung field cannot be excluded. Mild bibasilar atelectasis noted. No pleural effusion or pneumothorax. No acute bony abnormality identified. IMPRESSION: Mild left mid lung field infiltrate. Mild bibasilar subsegmental atelectasis. 2. Cardiomegaly. 3. No acute bony abnormality identified.  No pneumothorax . Electronically Signed   By: Marcello Moores  Register   On: 08/23/2016 14:06   Dg Lumbar Spine 2-3 Views  Result Date: 08/23/2016 CLINICAL DATA:  Recent fall with low back pain, initial encounter EXAM: LUMBAR SPINE - 2-3 VIEW COMPARISON:  None. FINDINGS: Five lumbar type vertebral bodies are well visualized. Vertebral body height is well maintained. No anterolisthesis is noted. Multilevel disc space narrowing is seen. Dilatation of the distal abdominal aorta is noted but incompletely evaluated on  this exam. IMPRESSION: Degenerative change without acute abnormality. Electronically Signed  By: Inez Catalina M.D.   On: 08/23/2016 13:08   Ct Head Wo Contrast  Result Date: 08/23/2016 CLINICAL DATA:  Unwitnessed fall. EXAM: CT HEAD WITHOUT CONTRAST TECHNIQUE: Contiguous axial images were obtained from the base of the skull through the vertex without intravenous contrast. COMPARISON:  04/08/2014 FINDINGS: Brain: Expected cerebral volume loss for age. Resultant prominence of the extra-axial spaces adjacent the frontal lobes. No mass lesion, hemorrhage, hydrocephalus, acute infarct, intra-axial, or extra-axial fluid collection. Vascular: No hyperdense vessel or unexpected calcification. Skull: No significant soft tissue swelling. Hyperostosis frontalis interna. Sinuses/Orbits: Normal orbits and globes. Partial opacification of left mastoid air cells is chronic. Other paranasal sinuses are clear. Other: None IMPRESSION: 1.  No acute intracranial abnormality. 2. Partial left mastoid opacification, similar. Electronically Signed   By: Abigail Miyamoto M.D.   On: 08/23/2016 12:55   US Abdomen Complete  Result Date: 08/25/2016 CLINICAL DATA:  Abdominal pain, increased yesterday EXAM: ABDOMEN ULTRASOUND COMPLETE COMPARISON:  Ultrasound of the abdomen limited of 09/11/2012 and CT abdomen pelvis of the same day FINDINGS: Gallbladder: The gallbladder is visualized and there appear to be tiny echo densities within the gallbladder consistent with small gallstones. No pain is present over the gallbladder and the gallbladder wall is not thickened. The largest of these gallstones measures 5 mm in diameter. Common bile duct: Diameter: The common bile duct is normal measuring 3.6 mm in diameter. Liver: The liver echogenicity is somewhat increased suggesting mild fatty infiltration. No definite focal hepatic abnormality is seen. IVC: No abnormality visualized. Pancreas: The pancreas is moderately well seen in the head and body  with the distal tail obscured by bowel gas. Spleen: The spleen measures 9.9 cm. Right Kidney: Length: The right kidney measures 12.1 cm. A tiny cyst is noted in the lower pole of 1.4 cm. Left Kidney: Length: 8.7 cm. There is slight lobulation to the upper pole of questionable significance appearing to have similar echogenicity. Abdominal aorta: There is some bulging of the distal abdominal aorta to maximum diameter of 2.6 cm. Ectatic abdominal aorta at risk for aneurysm development. Recommend followup by ultrasound in 5 years. This recommendation follows ACR consensus guidelines: White Paper of the ACR Incidental Findings Committee II on Vascular Findings. J Am Coll Radiol 2013; 10:789-794. Other findings: A tiny amount of fluid is noted adjacent to the liver and gallbladder. This study is somewhat limited due to patient body habitus. IMPRESSION: 1. Tiny gallstones.  No pain over the gallbladder. 2. Slightly echogenic liver parenchyma may indicate mild fatty infiltration. 3. Small distal abdominal aortic bulge of 2.6 cm. Ectatic abdominal aorta at risk for aneurysm development. Recommend followup by ultrasound in 5 years. This recommendation follows ACR consensus guidelines: White Paper of the ACR Incidental Findings Committee II on Vascular Findings. J Am Coll Radiol 2013; 10:789-794. 4. Tiny amount of fluid adjacent to the liver and gallbladder. Electronically Signed   By: Ivar Drape M.D.   On: 08/25/2016 09:48   US Venous Img Lower Bilateral  Result Date: 08/24/2016 CLINICAL DATA:  Swelling. EXAM: BILATERAL LOWER EXTREMITY VENOUS DOPPLER ULTRASOUND TECHNIQUE: Gray-scale sonography with graded compression, as well as color Doppler and duplex ultrasound were performed to evaluate the lower extremity deep venous systems from the level of the common femoral vein and including the common femoral, femoral, profunda femoral, popliteal and calf veins including the posterior tibial, peroneal and gastrocnemius veins  when visible. The superficial great saphenous vein was also interrogated. Spectral Doppler was utilized to evaluate flow at  rest and with distal augmentation maneuvers in the common femoral, femoral and popliteal veins. COMPARISON:  No prior. FINDINGS: RIGHT LOWER EXTREMITY Common Femoral Vein: No evidence of thrombus. Normal compressibility, respiratory phasicity and response to augmentation. Saphenofemoral Junction: No evidence of thrombus. Normal compressibility and flow on color Doppler imaging. Profunda Femoral Vein: No evidence of thrombus. Normal compressibility and flow on color Doppler imaging. Femoral Vein: No evidence of thrombus. Normal compressibility, respiratory phasicity and response to augmentation. Popliteal Vein: No evidence of thrombus. Normal compressibility, respiratory phasicity and response to augmentation. Calf Veins: No evidence of thrombus. Normal compressibility and flow on color Doppler imaging. Superficial Great Saphenous Vein: No evidence of thrombus. Normal compressibility and flow on color Doppler imaging. Other Findings:  None. LEFT LOWER EXTREMITY Common Femoral Vein: No evidence of thrombus. Normal compressibility, respiratory phasicity and response to augmentation. Saphenofemoral Junction: No evidence of thrombus. Normal compressibility and flow on color Doppler imaging. Profunda Femoral Vein: No evidence of thrombus. Normal compressibility and flow on color Doppler imaging. Femoral Vein: No evidence of thrombus. Normal compressibility, respiratory phasicity and response to augmentation. Popliteal Vein: No evidence of thrombus. Normal compressibility, respiratory phasicity and response to augmentation. Calf Veins: No evidence of thrombus. Normal compressibility and flow on color Doppler imaging. Superficial Great Saphenous Vein: No evidence of thrombus. Normal compressibility and flow on color Doppler imaging. Other Findings:  None. IMPRESSION: No evidence of deep venous  thrombosis. Electronically Signed   By: Marcello Moores  Register   On: 08/24/2016 12:51   Dg Hip Unilat With Pelvis 2-3 Views Left  Result Date: 08/23/2016 CLINICAL DATA:  from [place] following an unwitnessed fall. Pt got up out of wheelchair, got dizzy, went to sit down and missed wheelchair, Pt c/o low back pain, head pain, L hip pain EXAM: DG HIP (WITH OR WITHOUT PELVIS) 2-3V LEFT COMPARISON:  None. FINDINGS: No fracture.  No dislocation. There are advanced arthropathic changes of the left hip with severe superior joint space narrowing, mild subchondral sclerosis and subchondral cystic change. Small marginal osteophytes noted from the base of the left femoral head. There is mild axial right hip joint space narrowing. The SI joints and symphysis pubis are normally spaced and aligned. The bones are diffusely demineralized. Soft tissues are unremarkable. IMPRESSION: 1. No fracture, dislocation or acute finding. 2. 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 Electronically Signed   By: Lajean Manes M.D.   On: 08/23/2016 13:03    EKG:   Orders placed or performed during the hospital encounter of 08/23/16  . ED EKG  . ED EKG  . EKG 12-Lead  . EKG 12-Lead  . EKG 12-Lead  . EKG 12-Lead  . EKG 12-Lead  . EKG 12-Lead  . EKG 12-Lead  . EKG 12-Lead    ASSESSMENT AND PLAN:   #. Healthcare associated pneumonia in left mid lobe Clinically better  continue broad-spectrum antibiotic therapy with Zosyn Vancomycin discontinued as MRSA PCR negative , get sputum cultures if possible, continue nebulizing therapy, steroids IS, chest PT,OOB  #. Lower extremity swelling, ruled out DVT by Doppler's  #. Hypotension, resolved with IV fluids,  history ofDiastolic CHF, monitor for symptoms and signs of fluid overload Echocardiogram in February 2017 with 60-65% of left ventricular ejection fraction  #Acute kidney injury Gentle hydration with IV fluids. Creatinine at 1.4--1.11  #Elevated troponin  secondary to supply demand ischemia EKG no acute changes Serial troponin is less than 0.0 3--.17 and then 0.03 Cardiology consult is placed  #Generalized abdominal  pain Abdominal ultrasound WITH fatty liver , tiny gall stones No symptoms today  #Acute low back pain-x-rays normal pain management as needed  # Hyponatremia and fluid overloaded patient,  continue Lasix  follow sodium level 135  -134  #. Generalized weakness, PT recommending home health PT is 24 hour supervision   #. Atrial fibrillation  Rate controlled  PT recommending home health PT is 24 hour supervision All the records are reviewed and case discussed with Care Management/Social Workerr. Management plans discussed with the patient, daughter over phone and they are in agreement.  CODE STATUS: fc   TOTAL TIME TAKING CARE OF THIS PATIENT: 36  minutes.   POSSIBLE D/C IN am  DAYS, DEPENDING ON CLINICAL CONDITION.  Note: This dictation was prepared with Dragon dictation along with smaller phrase technology. Any transcriptional errors that result from this process are unintentional.   [name] M.D on 08/25/2016 at 10:13 AM  Between 7am to 6pm - Pager - [phone] After 6pm go to [email or web address] - password [email or web address] [facility]  [facility]  Office  [phone]  CC: Primary care physician; No primary care provider on file.

## 2016-08-25 NOTE — Progress Notes (Signed)
Tina [name] is a 83 y.o. female  [id number]  Primary Cardiologist: [place]  Reason for Consultation:elevated troponins  HPI: 23 YOWF presented to hospital with shortness of breath and syncope. She says she fell and passed out, but no chest pain but has dysnoea and leg swelling.   Review of Systems: No chest pains.   Past Medical History:  Diagnosis Date  . Anemia   . Anxiety   . Bronchitis   . CAD (coronary artery disease)    s/p PCI  . CKD stage 3 due to type 2 diabetes mellitus ([place])   . Colon cancer ([place])    treated, surgery, chemo, radiation  . COPD (chronic obstructive pulmonary disease) ([facility])    on 2L home o2  . Diabetes mellitus without complication ([place])   . Obesity   . Osteoarthritis   . Renal disorder     Medications Prior to Admission  Medication Sig Dispense Refill  . acetaminophen (TYLENOL) 500 MG tablet Take 500 mg by mouth every 4 (four) hours as needed for mild pain, fever or headache.    . albuterol (PROVENTIL HFA;VENTOLIN HFA) 108 (90 Base) MCG/ACT inhaler Inhale 2 puffs into the lungs every 4 (four) hours as needed for wheezing or shortness of breath.    Marland Kitchen alum & mag hydroxide-simeth (MAALOX PLUS) 400-400-40 MG/5ML suspension Take 30 mLs by mouth every 6 (six) hours as needed for indigestion.    Marland Kitchen aspirin 81 MG chewable tablet Chew 81 mg by mouth daily.    Marland Kitchen buPROPion (WELLBUTRIN XL) 150 MG 24 hr tablet Take 150 mg by mouth daily.    . calcium carbonate (TUMS - DOSED IN MG ELEMENTAL CALCIUM) 500 MG chewable tablet Chew 1 tablet by mouth 2 (two) times daily.    . cetaphil (CETAPHIL) lotion Apply 1 application topically as needed for dry skin.    . Cholecalciferol (VITAMIN D) 2000 units CAPS Take 2,000 Units by mouth daily.    . clotrimazole (LOTRIMIN) 1 % cream Apply 1 application topically as needed (for rash under both breasts and left groin).    Marland Kitchen docusate sodium (COLACE) 100 MG capsule Take 100 mg by mouth 2 (two) times daily.    Marland Kitchen donepezil  (ARICEPT) 5 MG tablet Take 5 mg by mouth daily.    . ferrous sulfate 325 (65 FE) MG tablet Take 325 mg by mouth 3 (three) times a week. Take 325 mg by mouth on Monday, Wednesday and Friday.    . Fluticasone-Salmeterol (ADVAIR) 250-50 MCG/DOSE AEPB Inhale 1 puff into the lungs 2 (two) times daily.    . furosemide (LASIX) 40 MG tablet Take 40 mg by mouth 2 (two) times daily. Take 40 mg by mouth twice daily at 6 AM and 4 PM.    . guaiFENesin (MUCINEX) 600 MG 12 hr tablet Take 600 mg by mouth 2 (two) times daily.    Marland Kitchen guaifenesin (ROBITUSSIN) 100 MG/5ML syrup Take 200 mg by mouth every 6 (six) hours as needed for cough.    . insulin glargine (LANTUS) 100 UNIT/ML injection Inject 10 Units into the skin at bedtime.    . insulin regular (NOVOLIN R,HUMULIN R) 100 units/mL injection Inject 0-14 Units into the skin 2 (two) times daily as needed for high blood sugar (Per sliding scale). Inject twice daily (in the morning before morning meal and at bedtime before Lantus) as needed per sliding scale:    0-200:  0 units  201-250:  8 units  251-300:  10 units  301-350:  12 units  351-400:  14 units  Greater than 400 or less than 60:  Call MD    . ipratropium-albuterol (DUONEB) 0.5-2.5 (3) MG/3ML SOLN Take 3 mLs by nebulization every 6 (six) hours as needed (for wheezing/shortness of breath).    . lactulose, encephalopathy, (CHRONULAC) 10 GM/15ML SOLN Take 20 g by mouth daily.    Marland Kitchen levothyroxine (SYNTHROID, LEVOTHROID) 112 MCG tablet Take 112 mcg by mouth daily before breakfast.    . loperamide (IMODIUM) 2 MG capsule Take 2 mg by mouth as needed for diarrhea or loose stools.    Marland Kitchen LORazepam (ATIVAN) 0.5 MG tablet Take 0.5 mg by mouth every 8 (eight) hours as needed (for agitation).    . magnesium hydroxide (MILK OF MAGNESIA) 400 MG/5ML suspension Take 30 mLs by mouth at bedtime as needed for mild constipation.    Marland Kitchen neomycin-bacitracin-polymyxin (NEOSPORIN) [phone] ointment Apply 1 application topically as  needed (for wound care).    . nystatin (MYCOSTATIN/NYSTOP) 100000 UNIT/GM POWD Apply 1 g topically 2 (two) times daily as needed (for dry skin).    Marland Kitchen omeprazole (PRILOSEC) 20 MG capsule Take 20 mg by mouth daily.    . polyvinyl alcohol (LIQUIFILM TEARS) 1.4 % ophthalmic solution Place 1 drop into both eyes 2 (two) times daily.    . potassium chloride SA (K-DUR,KLOR-CON) 20 MEQ tablet Take 20 mEq by mouth daily.    . sertraline (ZOLOFT) 50 MG tablet Take 50 mg by mouth daily. Take 50 mg by mouth daily at 8 PM.    . simethicone (MYLICON) 80 MG chewable tablet Chew 80 mg by mouth every 8 (eight) hours.    . Skin Protectants, Misc. (BAZA PROTECT EX) Apply 1 application topically 2 (two) times daily as needed (for redness or open wounds).       Marland Kitchen aspirin EC  81 mg Oral Daily  . buPROPion  150 mg Oral Daily  . chlorhexidine  15 mL Mouth Rinse BID  . docusate sodium  100 mg Oral BID  . donepezil  5 mg Oral Daily  . enoxaparin (LOVENOX) injection  40 mg Subcutaneous Q24H  . ferrous sulfate  325 mg Oral Once per day on Mon Wed Fri  . furosemide  40 mg Oral BID  . insulin aspart  0-15 Units Subcutaneous TID WC  . insulin aspart  0-5 Units Subcutaneous QHS  . insulin aspart  4 Units Subcutaneous TID WC  . insulin glargine  10 Units Subcutaneous QHS  . lactulose  20 g Oral Daily  . levothyroxine  112 mcg Oral QAC breakfast  . mouth rinse  15 mL Mouth Rinse q12n4p  . mometasone-formoterol  2 puff Inhalation BID  . pantoprazole  40 mg Oral Daily  . piperacillin-tazobactam (ZOSYN)  IV  3.375 g Intravenous Q8H  . polyvinyl alcohol  1 drop Both Eyes BID  . potassium chloride SA  20 mEq Oral Daily  . sertraline  50 mg Oral Daily  . sodium chloride flush  3 mL Intravenous Q12H  . sodium chloride flush  3 mL Intravenous Q12H  . tiotropium  18 mcg Inhalation Daily    Infusions:   Allergies  Allergen Reactions  . Corn-Containing Products Other (See Comments)    Reaction:  Unknown     Social  History   Social History  . Marital status: Divorced    Spouse name: N/A  . Number of children: N/A  . Years of education: N/A   Occupational  History  . Not on file.   Social History Main Topics  . Smoking status: Former Research scientist (life sciences)  . Smokeless tobacco: Never Used     Comment: quit 20 years ago  . Alcohol use No  . Drug use: No  . Sexual activity: Not on file   Other Topics Concern  . Not on file   Social History Narrative   Lives at [facility] assisted living   Wheel chair bound       Family History  Problem Relation Age of Onset  . Diabetes Mellitus II Mother     PHYSICAL EXAM: Vitals:   08/25/16 0737 08/25/16 1036  BP: 130/71 (!) 120/49  Pulse: (!) 54   Resp: 17   Temp: 98.1 F (36.7 C)      Intake/Output Summary (Last 24 hours) at 08/25/16 1159 Last data filed at 08/25/16 1023  Gross per 24 hour  Intake              852 ml  Output             3625 ml  Net            -2773 ml    General:  Well appearing. No respiratory difficulty HEENT: normal Neck: supple. no JVD. Carotids 2+ bilat; no bruits. No lymphadenopathy or thryomegaly appreciated. Cor: PMI nondisplaced. Regular rate & rhythm. No rubs, gallops or murmurs. Lungs: clear Abdomen: soft, nontender, nondistended. No hepatosplenomegaly. No bruits or masses. Good bowel sounds. Extremities: no cyanosis, clubbing, rash, edema Neuro: alert & oriented x 3, cranial nerves grossly intact. moves all 4 extremities w/o difficulty. Affect pleasant.  ECG: NSR no acute changes  Results for orders placed or performed during the hospital encounter of 08/23/16 (from the past 24 hour(s))  Glucose, capillary     Status: Abnormal   Collection Time: 08/24/16 12:46 PM  Result Value Ref Range   Glucose-Capillary 104 (H) 65 - 99 mg/dL  Troponin I     Status: None   Collection Time: 08/24/16  1:17 PM  Result Value Ref Range   Troponin I <0.03 <0.03 ng/mL  Glucose, capillary     Status: Abnormal   Collection Time:  08/24/16  4:27 PM  Result Value Ref Range   Glucose-Capillary 114 (H) 65 - 99 mg/dL  Glucose, capillary     Status: Abnormal   Collection Time: 08/24/16  9:54 PM  Result Value Ref Range   Glucose-Capillary 125 (H) 65 - 99 mg/dL  Procalcitonin     Status: None   Collection Time: 08/25/16  4:49 AM  Result Value Ref Range   Procalcitonin 0.20 ng/mL  Basic metabolic panel     Status: Abnormal   Collection Time: 08/25/16  4:49 AM  Result Value Ref Range   Sodium 134 (L) 135 - 145 mmol/L   Potassium 3.7 3.5 - 5.1 mmol/L   Chloride 96 (L) 101 - 111 mmol/L   CO2 32 22 - 32 mmol/L   Glucose, Bld 105 (H) 65 - 99 mg/dL   BUN 15 6 - 20 mg/dL   Creatinine, Ser 1.11 (H) 0.44 - 1.00 mg/dL   Calcium 9.3 8.9 - 10.3 mg/dL   GFR calc non Af Amer 44 (L) >60 mL/min   GFR calc Af Amer 51 (L) >60 mL/min   Anion gap 6 5 - 15  Glucose, capillary     Status: Abnormal   Collection Time: 08/25/16  7:35 AM  Result Value Ref Range   Glucose-Capillary 117 (  H) 65 - 99 mg/dL   Comment 1 Notify RN   Glucose, capillary     Status: Abnormal   Collection Time: 08/25/16  9:59 AM  Result Value Ref Range   Glucose-Capillary 105 (H) 65 - 99 mg/dL   Comment 1 Notify RN   Glucose, capillary     Status: Abnormal   Collection Time: 08/25/16 11:45 AM  Result Value Ref Range   Glucose-Capillary 116 (H) 65 - 99 mg/dL   Comment 1 Notify RN    Dg Chest 1 View  Result Date: 08/23/2016 CLINICAL DATA:  Unwitnessed fall. EXAM: CHEST 1 VIEW COMPARISON:  08/27/2015 . FINDINGS: Mediastinum and hilar structures normal. Cardiomegaly. Mild infiltrate left mid lung field cannot be excluded. Mild bibasilar atelectasis noted. No pleural effusion or pneumothorax. No acute bony abnormality identified. IMPRESSION: Mild left mid lung field infiltrate. Mild bibasilar subsegmental atelectasis. 2. Cardiomegaly. 3. No acute bony abnormality identified.  No pneumothorax . Electronically Signed   By: Marcello Moores  Register   On: 08/23/2016 14:06    Dg Lumbar Spine 2-3 Views  Result Date: 08/23/2016 CLINICAL DATA:  Recent fall with low back pain, initial encounter EXAM: LUMBAR SPINE - 2-3 VIEW COMPARISON:  None. FINDINGS: Five lumbar type vertebral bodies are well visualized. Vertebral body height is well maintained. No anterolisthesis is noted. Multilevel disc space narrowing is seen. Dilatation of the distal abdominal aorta is noted but incompletely evaluated on this exam. IMPRESSION: Degenerative change without acute abnormality. Electronically Signed   By: Inez Catalina M.D.   On: 08/23/2016 13:08   Ct Head Wo Contrast  Result Date: 08/23/2016 CLINICAL DATA:  Unwitnessed fall. EXAM: CT HEAD WITHOUT CONTRAST TECHNIQUE: Contiguous axial images were obtained from the base of the skull through the vertex without intravenous contrast. COMPARISON:  04/08/2014 FINDINGS: Brain: Expected cerebral volume loss for age. Resultant prominence of the extra-axial spaces adjacent the frontal lobes. No mass lesion, hemorrhage, hydrocephalus, acute infarct, intra-axial, or extra-axial fluid collection. Vascular: No hyperdense vessel or unexpected calcification. Skull: No significant soft tissue swelling. Hyperostosis frontalis interna. Sinuses/Orbits: Normal orbits and globes. Partial opacification of left mastoid air cells is chronic. Other paranasal sinuses are clear. Other: None IMPRESSION: 1.  No acute intracranial abnormality. 2. Partial left mastoid opacification, similar. Electronically Signed   By: Abigail Miyamoto M.D.   On: 08/23/2016 12:55   US Abdomen Complete  Result Date: 08/25/2016 CLINICAL DATA:  Abdominal pain, increased yesterday EXAM: ABDOMEN ULTRASOUND COMPLETE COMPARISON:  Ultrasound of the abdomen limited of 09/11/2012 and CT abdomen pelvis of the same day FINDINGS: Gallbladder: The gallbladder is visualized and there appear to be tiny echo densities within the gallbladder consistent with small gallstones. No pain is present over the gallbladder  and the gallbladder wall is not thickened. The largest of these gallstones measures 5 mm in diameter. Common bile duct: Diameter: The common bile duct is normal measuring 3.6 mm in diameter. Liver: The liver echogenicity is somewhat increased suggesting mild fatty infiltration. No definite focal hepatic abnormality is seen. IVC: No abnormality visualized. Pancreas: The pancreas is moderately well seen in the head and body with the distal tail obscured by bowel gas. Spleen: The spleen measures 9.9 cm. Right Kidney: Length: The right kidney measures 12.1 cm. A tiny cyst is noted in the lower pole of 1.4 cm. Left Kidney: Length: 8.7 cm. There is slight lobulation to the upper pole of questionable significance appearing to have similar echogenicity. Abdominal aorta: There is some bulging of the distal abdominal  aorta to maximum diameter of 2.6 cm. Ectatic abdominal aorta at risk for aneurysm development. Recommend followup by ultrasound in 5 years. This recommendation follows ACR consensus guidelines: White Paper of the ACR Incidental Findings Committee II on Vascular Findings. J Am Coll Radiol 2013; 10:789-794. Other findings: A tiny amount of fluid is noted adjacent to the liver and gallbladder. This study is somewhat limited due to patient body habitus. IMPRESSION: 1. Tiny gallstones.  No pain over the gallbladder. 2. Slightly echogenic liver parenchyma may indicate mild fatty infiltration. 3. Small distal abdominal aortic bulge of 2.6 cm. Ectatic abdominal aorta at risk for aneurysm development. Recommend followup by ultrasound in 5 years. This recommendation follows ACR consensus guidelines: White Paper of the ACR Incidental Findings Committee II on Vascular Findings. J Am Coll Radiol 2013; 10:789-794. 4. Tiny amount of fluid adjacent to the liver and gallbladder. Electronically Signed   By: Ivar Drape M.D.   On: 08/25/2016 09:48   US Venous Img Lower Bilateral  Result Date: 08/24/2016 CLINICAL DATA:   Swelling. EXAM: BILATERAL LOWER EXTREMITY VENOUS DOPPLER ULTRASOUND TECHNIQUE: Gray-scale sonography with graded compression, as well as color Doppler and duplex ultrasound were performed to evaluate the lower extremity deep venous systems from the level of the common femoral vein and including the common femoral, femoral, profunda femoral, popliteal and calf veins including the posterior tibial, peroneal and gastrocnemius veins when visible. The superficial great saphenous vein was also interrogated. Spectral Doppler was utilized to evaluate flow at rest and with distal augmentation maneuvers in the common femoral, femoral and popliteal veins. COMPARISON:  No prior. FINDINGS: RIGHT LOWER EXTREMITY Common Femoral Vein: No evidence of thrombus. Normal compressibility, respiratory phasicity and response to augmentation. Saphenofemoral Junction: No evidence of thrombus. Normal compressibility and flow on color Doppler imaging. Profunda Femoral Vein: No evidence of thrombus. Normal compressibility and flow on color Doppler imaging. Femoral Vein: No evidence of thrombus. Normal compressibility, respiratory phasicity and response to augmentation. Popliteal Vein: No evidence of thrombus. Normal compressibility, respiratory phasicity and response to augmentation. Calf Veins: No evidence of thrombus. Normal compressibility and flow on color Doppler imaging. Superficial Great Saphenous Vein: No evidence of thrombus. Normal compressibility and flow on color Doppler imaging. Other Findings:  None. LEFT LOWER EXTREMITY Common Femoral Vein: No evidence of thrombus. Normal compressibility, respiratory phasicity and response to augmentation. Saphenofemoral Junction: No evidence of thrombus. Normal compressibility and flow on color Doppler imaging. Profunda Femoral Vein: No evidence of thrombus. Normal compressibility and flow on color Doppler imaging. Femoral Vein: No evidence of thrombus. Normal compressibility, respiratory  phasicity and response to augmentation. Popliteal Vein: No evidence of thrombus. Normal compressibility, respiratory phasicity and response to augmentation. Calf Veins: No evidence of thrombus. Normal compressibility and flow on color Doppler imaging. Superficial Great Saphenous Vein: No evidence of thrombus. Normal compressibility and flow on color Doppler imaging. Other Findings:  None. IMPRESSION: No evidence of deep venous thrombosis. Electronically Signed   By: Marcello Moores  Register   On: 08/24/2016 12:51   Dg Hip Unilat With Pelvis 2-3 Views Left  Result Date: 08/23/2016 CLINICAL DATA:  from [place] following an unwitnessed fall. Pt got up out of wheelchair, got dizzy, went to sit down and missed wheelchair, Pt c/o low back pain, head pain, L hip pain EXAM: DG HIP (WITH OR WITHOUT PELVIS) 2-3V LEFT COMPARISON:  None. FINDINGS: No fracture.  No dislocation. There are advanced arthropathic changes of the left hip with severe superior joint space narrowing, mild subchondral sclerosis  and subchondral cystic change. Small marginal osteophytes noted from the base of the left femoral head. There is mild axial right hip joint space narrowing. The SI joints and symphysis pubis are normally spaced and aligned. The bones are diffusely demineralized. Soft tissues are unremarkable. IMPRESSION: 1. No fracture, dislocation or acute finding. 2. 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 Electronically Signed   By: Lajean Manes M.D.   On: 08/23/2016 13:03     ASSESSMENT AND PLAN:Mildly elevated troponins due to demand ischaemia which has trended down now and no chest pain. Advise out patient stress test.  Danae Oland A

## 2016-08-25 NOTE — Clinical Social Work Note (Signed)
Clinical Social Work Assessment  Patient Details  Name: Tina [name] MRN: [id number] Date of Birth: Dec 12, 1927  Date of referral:  08/25/16               Reason for consult:  Facility Placement                Permission sought to share information with:    Permission granted to share information::     Name::        Agency::     Relationship::     Contact Information:     Housing/Transportation Living arrangements for the past 2 months:  [facility] of Information:  Adult Children Patient Interpreter Needed:  None Criminal Activity/Legal Involvement Pertinent to Current Situation/Hospitalization:  No - Comment as needed Significant Relationships:  Adult Children Lives with:  Facility Resident Do you feel safe going back to the place where you live?  Yes Need for family participation in patient care:  Yes (Comment)  Care giving concerns:  Patient resides at [facility].   Social Worker assessment / plan:  Patient is confused therefore assessment completed by contacting patient's daughter: Tina [name]: [phone]. Tina [name] informed CSW that patient has been a resident of [facility] since it opened about 3 or 4 years ago. Tina [name] wishes for patient to return when time. She expressed that patient's baseline is being able to stand and pivot and that she is mostly wheelchair bound and requires assistance with all ADL's. CSW to contact [facility] to ensure they can take patient back at discharge.  Employment status:  Retired Forensic scientist:  Medicare PT Recommendations:    Information / Referral to community resources:     Patient/Family's Response to care:  Patient's daughter expressed appreciation for CSW call.  Patient/Family's Understanding of and Emotional Response to Diagnosis, Current Treatment, and Prognosis:  Patient's daughter is aware of treatment plan and is hopeful her mother will get to discharge back to [facility]  soon.  Emotional Assessment Appearance:  Appears stated age Attitude/Demeanor/Rapport:  Unable to Assess Affect (typically observed):  Unable to Assess Orientation:  Oriented to Self Alcohol / Substance use:  Not Applicable Psych involvement (Current and /or in the community):  No (Comment)  Discharge Needs  Concerns to be addressed:  Care Coordination Readmission within the last 30 days:  No Current discharge risk:  None Barriers to Discharge:  No Barriers Identified   Tina Leff, LCSW 08/25/2016, 1:23 PM

## 2016-08-25 NOTE — Progress Notes (Signed)
Sent text page to Dr. Margaretmary Eddy stating that patient was still having abdominal pain and gas. Unsure of patient last BM. Asked if MD wanted to order anything additional for BM as pt has taken lactulose and docusate

## 2016-08-26 LAB — GLUCOSE, CAPILLARY
GLUCOSE-CAPILLARY: 111 mg/dL — AB (ref 65–99)
Glucose-Capillary: 123 mg/dL — ABNORMAL HIGH (ref 65–99)
Glucose-Capillary: 108 mg/dL — ABNORMAL HIGH (ref 65–99)
Glucose-Capillary: 126 mg/dL — ABNORMAL HIGH (ref 65–99)

## 2016-08-26 MED ORDER — TIOTROPIUM BROMIDE MONOHYDRATE 18 MCG IN CAPS: 18.0000 ug | ORAL_CAPSULE | Freq: Every day | RESPIRATORY_TRACT | 12 refills | Status: DC

## 2016-08-26 MED ORDER — METOPROLOL SUCCINATE ER 50 MG PO TB24
50.0000 mg | ORAL_TABLET | Freq: Every day | ORAL | Status: DC
  Administered 2016-08-26: 50 mg via ORAL
  Filled 2016-08-26: qty 1

## 2016-08-26 MED ORDER — INSULIN ASPART 100 UNIT/ML ~~LOC~~ SOLN: 4.0000 [IU] | Freq: Three times a day (TID) | SUBCUTANEOUS | 11 refills | Status: DC

## 2016-08-26 MED ORDER — AMOXICILLIN-POT CLAVULANATE 875-125 MG PO TABS
1.0000 | ORAL_TABLET | Freq: Two times a day (BID) | ORAL | 0 refills | Status: DC
Start: 2016-08-26 — End: 2018-08-10

## 2016-08-26 MED ORDER — METOPROLOL SUCCINATE ER 50 MG PO TB24: 50.0000 mg | ORAL_TABLET | Freq: Every day | ORAL | 0 refills | Status: DC

## 2016-08-26 MED ORDER — SACCHAROMYCES BOULARDII 250 MG PO CAPS: 250.0000 mg | ORAL_CAPSULE | Freq: Two times a day (BID) | ORAL | 0 refills | Status: DC

## 2016-08-26 MED ORDER — OXYCODONE HCL 5 MG PO TABS: 5.0000 mg | ORAL_TABLET | ORAL | 0 refills | Status: DC | PRN

## 2016-08-26 MED ORDER — ONDANSETRON HCL 4 MG PO TABS: 4.0000 mg | ORAL_TABLET | Freq: Four times a day (QID) | ORAL | 0 refills | Status: DC | PRN

## 2016-08-26 NOTE — Progress Notes (Signed)
EMS is present for transport, packet handed to transporters. IV d/c, VSS, [facility] called for update on pt.'s transport. Daughter also notified of pt being transported at this time.   Tina [name] [employer]

## 2016-08-26 NOTE — Progress Notes (Signed)
Speech Language Pathology Treatment: Dysphagia  Patient Details Name: Tina [name] MRN: [id number] DOB: 04/08/1928 Today's Date: 08/26/2016 Time: 67-1501 SLP Time Calculation (min) (ACUTE ONLY): 31 min  Assessment / Plan / Recommendation Clinical Impression  The patient is able to effectively masticate and swallow mechanical soft/Dysphagia 3 foods and drink thin liquid via cup rim.  Nursing reports that she is doing well with POs as well.  The patient is having stomach pain and appears to have reduced appetite as a result.  Recommend continuing current diet and swallowing recommendations.     HPI HPI: Pt is a 83 y.o. female with a known history of CHF, diabetes, CAD stage III, obesity, COPD, chronic respiratory failure, supposed to be on oxygen therapy, however, refuses, who presents to the hospital with complaints of fatigue, weakness, cough, unknown period of time, worsening lower extremity swelling, wheezing, white phlegm production, shortness of breath. On arrival to the hospital patient was noted to the hypotensive, systolic blood pressure of 96, in atrial fibrillation, controlled rate of 76, tachypneic, labs revealed an elevated white blood cell count of almost 21,000, chest x-ray revealed left middle lobe pneumonia. Hospitalist services were contacted for admission. Patient not able to provide much history due to dementia, although admits of some left-sided chest pains, shortness of breath, cough, wheezing, white phlegm production for unknown period of time. Currently, pt with complains of stomach and back aching- SLP not able to raise HOB to 90 degrees (closer to 80-85 degrees for PO intake) Pt alert and responsive, but a bit confused. She repeated mutliple things- "I haven't eaten anything all day", "I feel like I'm pregnant, my stomach hurts". Pt also confused on how long she has been in hospital.       [place] with current plan of care     Recommendations  Diet  recommendations: Dysphagia 3 (mechanical soft);Thin liquid Medication Administration: Whole meds with puree                Plan: Continue with current plan of care       GO                Tina [name], Tina [name] 08/26/2016, 3:02 PM

## 2016-08-26 NOTE — Progress Notes (Signed)
Pharmacy Antibiotic Note  Tina [name] is a 83 y.o. female admitted on 08/23/2016 with pneumonia.  Pharmacy has been consulted for vancomycin and piperacillin/tazobactam dosing. Patient received a dose of each in ED and tolerated. Vancomycin discontinued 1/31.  Plan: Day 4 of Piperacillin/tazobactam 3.375 g IV q8h EI    Height: 5\' 7"  (170.2 cm) Weight: 230 lb 11.2 oz (104.6 kg) IBW/kg (Calculated) : 61.6  Temp (24hrs), Avg:98.2 F (36.8 C), Min:97.6 F (36.4 C), Max:98.5 F (36.9 C)   Recent Labs Lab 08/23/16 1148 08/23/16 1514 08/23/16 1715 08/24/16 0521 08/25/16 0449  WBC 20.9*  --   --  13.1*  --   CREATININE 1.49*  --   --  1.40* 1.11*  LATICACIDVEN  --  1.2 1.7  --   --     Estimated Creatinine Clearance: 45.3 mL/min (by C-G formula based on SCr of 1.11 mg/dL (H)).    Allergies  Allergen Reactions  . Corn-Containing Products Other (See Comments)    Reaction:  Unknown    Antimicrobials this admission: Piperacillin/tazobactam 1/30 >>  vancomycin 1/30 >> 1/31  Dose adjustments this admission:  Microbiology results: 1/30 Sputum: Sent  1/30 MRSA [id number] PCT= 0.31  And 0.20  Thank you for allowing pharmacy to be a part of this patient's care.  Noralee Space, PharmD Clinical Pharmacist 08/26/2016 12:48 PM

## 2016-08-26 NOTE — Discharge Instructions (Signed)
Follow-up with primary care physician in a week Follow-up with cardiology Dr. Humphrey Rolls in a week and for outpatient stress test Follow-up with gastroenterology in 2 weeks

## 2016-08-26 NOTE — Clinical Social Work Note (Signed)
Patient to discharge today. CSW spoke with patient's daughter and she verbalized understanding and was appreciative of CSW phone call. CSW spoke with Laverne at [facility] and she stated that she received the [facility] and that patient could come back at any time.  Shela Leff MSW,LCSW [phone]

## 2016-08-26 NOTE — Progress Notes (Signed)
RN notified [facility]. That pt had a liquid green bowel movement prior to enema, RN still gave enema per order. [facility]. Also made aware of pt is coughing dark brown/ tinge of red in her sputum and pt is still having increased abdomen pain. No new orders, RN made aware to proceed with discharge at this time.  Quintin Hjort [employer]

## 2016-08-26 NOTE — Care Management Important Message (Signed)
Important Message  Patient Details  Name: Tina [name] MRN: [id number] Date of Birth: 1928/05/16   Medicare Important Message Given:  Yes    Beverly Sessions, RN 08/26/2016, 5:25 PM

## 2016-08-26 NOTE — Care Management (Signed)
Patient to return to [facility] today with home health services.  Daughter states that she does not have a home health agency preference.  Referral made to Tim with [facility].  Patient to transport by EMS.  RNCM signing off.

## 2016-08-26 NOTE — Progress Notes (Signed)
Report was given to the receiving nurse at [facility] who will be getting Tina [name]. When non-emergent  transport was called, they notified RN that pt is now 12th on the list for transport. [facility] was notified that pt will be arriving to facility in the later hours.   Login [place] [employer]

## 2016-08-26 NOTE — Discharge Summary (Signed)
[facility] [place] NAME: Tina [name]    MR#:  [id number]  DATE OF BIRTH:  05/28/1928  DATE OF ADMISSION:  08/23/2016 ADMITTING PHYSICIAN: Theodoro Grist, MD  DATE OF DISCHARGE: 08/26/16 PRIMARY CARE PHYSICIAN: No primary care provider on file.    ADMISSION DIAGNOSIS:  Swelling [R60.9] Weakness [R53.1] HCAP (healthcare-associated pneumonia) [J18.9] Fall, initial encounter [W19.XXXA]  DISCHARGE DIAGNOSIS:  Active Problems:   Healthcare-associated pneumonia   Hypotension   Leukocytosis   Hyponatremia   Generalized weakness   Leg swelling   SECONDARY DIAGNOSIS:   Past Medical History:  Diagnosis Date  . Anemia   . Anxiety   . Bronchitis   . CAD (coronary artery disease)    s/p PCI  . CKD stage 3 due to type 2 diabetes mellitus ([place])   . Colon cancer ([place])    treated, surgery, chemo, radiation  . COPD (chronic obstructive pulmonary disease) ([facility])    on 2L home o2  . Diabetes mellitus without complication ([place])   . Obesity   . Osteoarthritis   . Renal disorder     HOSPITAL COURSE:  HISTORY OF PRESENT ILLNESS: Tina [name]  is a 83 y.o. female with a known history of CHF, diabetes, CAD stage III, obesity, COPD, chronic respiratory failure, supposed to be on oxygen therapy, however, refuses, who presents to the hospital with complaints of fatigue, weakness, cough, unknown period of time, worsening lower extremity swelling, wheezing, white phlegm production, shortness of breath. On arrival to the hospital patient was noted to the hypotensive, systolic blood pressure of 96, in atrial fibrillation, controlled rate of 76, tachypneic, labs revealed an elevated white blood cell count of almost 21,000, chest x-ray revealed left middle lobe pneumonia. Hospitalist services were contacted for admission. Patient not able to provide much history due to dementia, although admits of some left-sided chest pains, shortness of breath, cough,  wheezing, white phlegm production for unknown period of time  Hospital course   #. Healthcare associated pneumonia in left mid lobe Clinically better Improved with the broad-spectrum antibiotic Zosyn. Discharged with by mouth Augmentin vancomycin discontinued as MRSA PCR negative , get sputum cultures if possible, continue nebulizing therapy, steroids IS, chest PT,OOB Blood cultures are negative  #. Lower extremity swelling, ruled out DVT by Doppler's  #. Hypotension, resolved with IV fluids,  history ofDiastolic CHF, monitored for symptoms and signs of fluid overload Echocardiogram in February 2017 with 60-65% of left ventricular ejection fraction  #Acute kidney injury Gentle hydration with IV fluids. Creatinine at 1.4--1.11  #Elevated troponin secondary to supply demand ischemia EKG no acute changes Serial troponin is less than 0.0 3--.17 and then 0.03 Cardiology consult is placed  #Generalized abdominal pain-chronic Abdominal ultrasound WITH fatty liver , tiny gall stones No symptoms today Outpatient follow-up with gastroenterology in a week  #Acute low back pain-x-rays normal pain management as needed  # Hyponatremia and fluid overloaded patient,  continue Lasix  follow sodium level 135  -134  #. Generalized weakness, PT recommending home health PT is 24 hour supervision   #. Atrial fibrillation  Rate controlled. Metoprolol 50 mg Outpatient follow-up with Dr. Humphrey Rolls for possible outpatient stress test  PT recommending home health PT is 24 hour supervision All the records are reviewed and case discussed with Care Management/Social Workerr. Management plans discussed with the patient, daughter over phone and they are in agreement.        DISCHARGE CONDITIONS:   Stable  CONSULTS OBTAINED:  Treatment Team:  Dionisio David, MD   PROCEDURES none   DRUG ALLERGIES:   Allergies  Allergen Reactions  . Corn-Containing Products Other (See  Comments)    Reaction:  Unknown     DISCHARGE MEDICATIONS:   Current Discharge Medication List    START taking these medications   Details  amoxicillin-clavulanate (AUGMENTIN) 875-125 MG tablet Take 1 tablet by mouth 2 (two) times daily. Qty: 14 tablet, Refills: 0    insulin aspart (NOVOLOG) 100 UNIT/ML injection Inject 4 Units into the skin 3 (three) times daily with meals. Qty: 10 mL, Refills: 11    metoprolol succinate (TOPROL-XL) 50 MG 24 hr tablet Take 1 tablet (50 mg total) by mouth daily. Take with or immediately following a meal. Qty: 30 tablet, Refills: 0    ondansetron (ZOFRAN) 4 MG tablet Take 1 tablet (4 mg total) by mouth every 6 (six) hours as needed for nausea. Qty: 20 tablet, Refills: 0    oxyCODONE (OXY IR/ROXICODONE) 5 MG immediate release tablet Take 1 tablet (5 mg total) by mouth every 4 (four) hours as needed for moderate pain or breakthrough pain. Qty: 30 tablet, Refills: 0    saccharomyces boulardii (FLORASTOR) 250 MG capsule Take 1 capsule (250 mg total) by mouth 2 (two) times daily. Qty: 30 capsule, Refills: 0    tiotropium (SPIRIVA) 18 MCG inhalation capsule Place 1 capsule (18 mcg total) into inhaler and inhale daily. Qty: 30 capsule, Refills: 12      CONTINUE these medications which have NOT CHANGED   Details  acetaminophen (TYLENOL) 500 MG tablet Take 500 mg by mouth every 4 (four) hours as needed for mild pain, fever or headache.    albuterol (PROVENTIL HFA;VENTOLIN HFA) 108 (90 Base) MCG/ACT inhaler Inhale 2 puffs into the lungs every 4 (four) hours as needed for wheezing or shortness of breath.    alum & mag hydroxide-simeth (MAALOX PLUS) 400-400-40 MG/5ML suspension Take 30 mLs by mouth every 6 (six) hours as needed for indigestion.    aspirin 81 MG chewable tablet Chew 81 mg by mouth daily.    buPROPion (WELLBUTRIN XL) 150 MG 24 hr tablet Take 150 mg by mouth daily.    calcium carbonate (TUMS - DOSED IN MG ELEMENTAL CALCIUM) 500 MG  chewable tablet Chew 1 tablet by mouth 2 (two) times daily.    cetaphil (CETAPHIL) lotion Apply 1 application topically as needed for dry skin.    Cholecalciferol (VITAMIN D) 2000 units CAPS Take 2,000 Units by mouth daily.    clotrimazole (LOTRIMIN) 1 % cream Apply 1 application topically as needed (for rash under both breasts and left groin).    docusate sodium (COLACE) 100 MG capsule Take 100 mg by mouth 2 (two) times daily.    donepezil (ARICEPT) 5 MG tablet Take 5 mg by mouth daily.    ferrous sulfate 325 (65 FE) MG tablet Take 325 mg by mouth 3 (three) times a week. Take 325 mg by mouth on Monday, Wednesday and Friday.    Fluticasone-Salmeterol (ADVAIR) 250-50 MCG/DOSE AEPB Inhale 1 puff into the lungs 2 (two) times daily.    furosemide (LASIX) 40 MG tablet Take 40 mg by mouth 2 (two) times daily. Take 40 mg by mouth twice daily at 6 AM and 4 PM.    guaiFENesin (MUCINEX) 600 MG 12 hr tablet Take 600 mg by mouth 2 (two) times daily.    guaifenesin (ROBITUSSIN) 100 MG/5ML syrup Take 200 mg by mouth every 6 (  six) hours as needed for cough.    insulin glargine (LANTUS) 100 UNIT/ML injection Inject 10 Units into the skin at bedtime.    insulin regular (NOVOLIN R,HUMULIN R) 100 units/mL injection Inject 0-14 Units into the skin 2 (two) times daily as needed for high blood sugar (Per sliding scale). Inject twice daily (in the morning before morning meal and at bedtime before Lantus) as needed per sliding scale:    0-200:  0 units  201-250:  8 units  251-300:  10 units  301-350:  12 units  351-400:  14 units  Greater than 400 or less than 60:  Call MD    ipratropium-albuterol (DUONEB) 0.5-2.5 (3) MG/3ML SOLN Take 3 mLs by nebulization every 6 (six) hours as needed (for wheezing/shortness of breath).    lactulose, encephalopathy, (CHRONULAC) 10 GM/15ML SOLN Take 20 g by mouth daily.    levothyroxine (SYNTHROID, LEVOTHROID) 112 MCG tablet Take 112 mcg by mouth daily before breakfast.     loperamide (IMODIUM) 2 MG capsule Take 2 mg by mouth as needed for diarrhea or loose stools.    LORazepam (ATIVAN) 0.5 MG tablet Take 0.5 mg by mouth every 8 (eight) hours as needed (for agitation).    magnesium hydroxide (MILK OF MAGNESIA) 400 MG/5ML suspension Take 30 mLs by mouth at bedtime as needed for mild constipation.    neomycin-bacitracin-polymyxin (NEOSPORIN) [phone] ointment Apply 1 application topically as needed (for wound care).    nystatin (MYCOSTATIN/NYSTOP) 100000 UNIT/GM POWD Apply 1 g topically 2 (two) times daily as needed (for dry skin).    omeprazole (PRILOSEC) 20 MG capsule Take 20 mg by mouth daily.    polyvinyl alcohol (LIQUIFILM TEARS) 1.4 % ophthalmic solution Place 1 drop into both eyes 2 (two) times daily.    potassium chloride SA (K-DUR,KLOR-CON) 20 MEQ tablet Take 20 mEq by mouth daily.    sertraline (ZOLOFT) 50 MG tablet Take 50 mg by mouth daily. Take 50 mg by mouth daily at 8 PM.    simethicone (MYLICON) 80 MG chewable tablet Chew 80 mg by mouth every 8 (eight) hours.    Skin Protectants, Misc. (BAZA PROTECT EX) Apply 1 application topically 2 (two) times daily as needed (for redness or open wounds).         DISCHARGE INSTRUCTIONS:  Follow-up with primary care physician in a week Follow-up with cardiology Dr. Humphrey Rolls in a week and for outpatient stress test Follow-up with gastroenterology in 1 weeks   DIET:  Cardiac diet  DISCHARGE CONDITION:  Fair  ACTIVITY:  Activity as tolerated, Home health PT and 24 hour supervision  OXYGEN:  Home Oxygen: Yes.     Oxygen Delivery: 2 liters/min via Patient connected to nasal cannula oxygen  DISCHARGE LOCATION:  Assisted living facility   If you experience worsening of your admission symptoms, develop shortness of breath, life threatening emergency, suicidal or homicidal thoughts you must seek medical attention immediately by calling 911 or calling your MD immediately  if symptoms less  severe.  You Must read complete instructions/literature along with all the possible adverse reactions/side effects for all the Medicines you take and that have been prescribed to you. Take any new Medicines after you have completely understood and accpet all the possible adverse reactions/side effects.   Please note  You were cared for by a hospitalist during your hospital stay. If you have any questions about your discharge medications or the care you received while you were in the hospital after you are discharged, you  can call the unit and asked to speak with the hospitalist on call if the hospitalist that took care of you is not available. Once you are discharged, your primary care physician will handle any further medical issues. Please note that NO REFILLS for any discharge medications will be authorized once you are discharged, as it is imperative that you return to your primary care physician (or establish a relationship with a primary care physician if you do not have one) for your aftercare needs so that they can reassess your need for medications and monitor your lab values.     Today  Chief Complaint  Patient presents with  . Fall   Patient is doing much better. Reporting back pain which is chronic in nature. Denies any abdominal pain She had a bowel movement day before yesterday  ROS:  CONSTITUTIONAL: Denies fevers, chills. Denies any fatigue, weakness.  EYES: Denies blurry vision, double vision, eye pain. EARS, NOSE, THROAT: Denies tinnitus, ear pain, hearing loss. RESPIRATORY: Denies cough, wheeze, shortness of breath.  CARDIOVASCULAR: Denies chest pain, palpitations, edema.  GASTROINTESTINAL: Denies nausea, vomiting, diarrhea, reporting abdominal bloating. Denies bright red blood per rectum. GENITOURINARY: Denies dysuria, hematuria. ENDOCRINE: Denies nocturia or thyroid problems. HEMATOLOGIC AND LYMPHATIC: Denies easy bruising or bleeding. SKIN: Denies rash or  lesion. MUSCULOSKELETAL: Denies pain in neck, back, shoulder, knees, hips or arthritic symptoms.  NEUROLOGIC: Denies paralysis, paresthesias.  PSYCHIATRIC: Denies anxiety or depressive symptoms.   VITAL SIGNS:  Blood pressure (!) 120/58, pulse 62, temperature 97.9 F (36.6 C), temperature source Oral, resp. rate 20, height 5\' 7"  (1.702 m), weight 104.6 kg (230 lb 11.2 oz), SpO2 96 %.  I/O:    Intake/Output Summary (Last 24 hours) at 08/26/16 1517 Last data filed at 08/26/16 1013  Gross per 24 hour  Intake              383 ml  Output             2000 ml  Net            -1617 ml    PHYSICAL EXAMINATION:  GENERAL:  83 y.o.-year-old patient lying in the bed with no acute distress.  EYES: Pupils equal, round, reactive to light and accommodation. No scleral icterus. Extraocular muscles intact.  HEENT: Head atraumatic, normocephalic. Oropharynx and nasopharynx clear.  NECK:  Supple, no jugular venous distention. No thyroid enlargement, no tenderness.  LUNGS: Moderate breath sounds bilaterally, no wheezing, rales,rhonchi or crepitation. No use of accessory muscles of respiration.  CARDIOVASCULAR: S1, S2 normal. No murmurs, rubs, or gallops.  ABDOMEN: Soft, non-tender, non-distended. Bowel sounds present. No organomegaly or mass.  EXTREMITIES: No pedal edema, cyanosis, or clubbing.  NEUROLOGIC: Cranial nerves II through XII are intact. Muscle strength 5/5 in all extremities. Sensation intact. Gait not checked.  PSYCHIATRIC: The patient is alert and oriented x 3.  SKIN: No obvious rash, lesion, or ulcer.   DATA REVIEW:   CBC  Recent Labs Lab 08/24/16 0521  WBC 13.1*  HGB 11.8*  HCT 37.0  PLT 186    Chemistries   Recent Labs Lab 08/25/16 0449  NA 134*  K 3.7  CL 96*  CO2 32  GLUCOSE 105*  BUN 15  CREATININE 1.11*  CALCIUM 9.3    Cardiac Enzymes  Recent Labs Lab 08/24/16 1317  [place] <0.03    Microbiology Results  Results for orders placed or performed  during the hospital encounter of 08/23/16  MRSA PCR Screening  Status: None   Collection Time: 08/23/16  7:25 PM  Result Value Ref Range Status   MRSA by PCR NEGATIVE NEGATIVE Final    Comment:        The GeneXpert MRSA Assay (FDA approved for NASAL specimens only), is one component of a comprehensive MRSA colonization surveillance program. It is not intended to diagnose MRSA infection nor to guide or monitor treatment for MRSA infections.     RADIOLOGY:  Dg Chest 1 View  Result Date: 08/23/2016 CLINICAL DATA:  Unwitnessed fall. EXAM: CHEST 1 VIEW COMPARISON:  08/27/2015 . FINDINGS: Mediastinum and hilar structures normal. Cardiomegaly. Mild infiltrate left mid lung field cannot be excluded. Mild bibasilar atelectasis noted. No pleural effusion or pneumothorax. No acute bony abnormality identified. IMPRESSION: Mild left mid lung field infiltrate. Mild bibasilar subsegmental atelectasis. 2. Cardiomegaly. 3. No acute bony abnormality identified.  No pneumothorax . Electronically Signed   By: Marcello Moores  Register   On: 08/23/2016 14:06   Dg Lumbar Spine 2-3 Views  Result Date: 08/23/2016 CLINICAL DATA:  Recent fall with low back pain, initial encounter EXAM: LUMBAR SPINE - 2-3 VIEW COMPARISON:  None. FINDINGS: Five lumbar type vertebral bodies are well visualized. Vertebral body height is well maintained. No anterolisthesis is noted. Multilevel disc space narrowing is seen. Dilatation of the distal abdominal aorta is noted but incompletely evaluated on this exam. IMPRESSION: Degenerative change without acute abnormality. Electronically Signed   By: Inez Catalina M.D.   On: 08/23/2016 13:08   Ct Head Wo Contrast  Result Date: 08/23/2016 CLINICAL DATA:  Unwitnessed fall. EXAM: CT HEAD WITHOUT CONTRAST TECHNIQUE: Contiguous axial images were obtained from the base of the skull through the vertex without intravenous contrast. COMPARISON:  04/08/2014 FINDINGS: Brain: Expected cerebral volume loss  for age. Resultant prominence of the extra-axial spaces adjacent the frontal lobes. No mass lesion, hemorrhage, hydrocephalus, acute infarct, intra-axial, or extra-axial fluid collection. Vascular: No hyperdense vessel or unexpected calcification. Skull: No significant soft tissue swelling. Hyperostosis frontalis interna. Sinuses/Orbits: Normal orbits and globes. Partial opacification of left mastoid air cells is chronic. Other paranasal sinuses are clear. Other: None IMPRESSION: 1.  No acute intracranial abnormality. 2. Partial left mastoid opacification, similar. Electronically Signed   By: Abigail Miyamoto M.D.   On: 08/23/2016 12:55   US Abdomen Complete  Result Date: 08/25/2016 CLINICAL DATA:  Abdominal pain, increased yesterday EXAM: ABDOMEN ULTRASOUND COMPLETE COMPARISON:  Ultrasound of the abdomen limited of 09/11/2012 and CT abdomen pelvis of the same day FINDINGS: Gallbladder: The gallbladder is visualized and there appear to be tiny echo densities within the gallbladder consistent with small gallstones. No pain is present over the gallbladder and the gallbladder wall is not thickened. The largest of these gallstones measures 5 mm in diameter. Common bile duct: Diameter: The common bile duct is normal measuring 3.6 mm in diameter. Liver: The liver echogenicity is somewhat increased suggesting mild fatty infiltration. No definite focal hepatic abnormality is seen. IVC: No abnormality visualized. Pancreas: The pancreas is moderately well seen in the head and body with the distal tail obscured by bowel gas. Spleen: The spleen measures 9.9 cm. Right Kidney: Length: The right kidney measures 12.1 cm. A tiny cyst is noted in the lower pole of 1.4 cm. Left Kidney: Length: 8.7 cm. There is slight lobulation to the upper pole of questionable significance appearing to have similar echogenicity. Abdominal aorta: There is some bulging of the distal abdominal aorta to maximum diameter of 2.6 cm. Ectatic abdominal aorta  at  risk for aneurysm development. Recommend followup by ultrasound in 5 years. This recommendation follows ACR consensus guidelines: White Paper of the ACR Incidental Findings Committee II on Vascular Findings. J Am Coll Radiol 2013; 10:789-794. Other findings: A tiny amount of fluid is noted adjacent to the liver and gallbladder. This study is somewhat limited due to patient body habitus. IMPRESSION: 1. Tiny gallstones.  No pain over the gallbladder. 2. Slightly echogenic liver parenchyma may indicate mild fatty infiltration. 3. Small distal abdominal aortic bulge of 2.6 cm. Ectatic abdominal aorta at risk for aneurysm development. Recommend followup by ultrasound in 5 years. This recommendation follows ACR consensus guidelines: White Paper of the ACR Incidental Findings Committee II on Vascular Findings. J Am Coll Radiol 2013; 10:789-794. 4. Tiny amount of fluid adjacent to the liver and gallbladder. Electronically Signed   By: Ivar Drape M.D.   On: 08/25/2016 09:48   US Venous Img Lower Bilateral  Result Date: 08/24/2016 CLINICAL DATA:  Swelling. EXAM: BILATERAL LOWER EXTREMITY VENOUS DOPPLER ULTRASOUND TECHNIQUE: Gray-scale sonography with graded compression, as well as color Doppler and duplex ultrasound were performed to evaluate the lower extremity deep venous systems from the level of the common femoral vein and including the common femoral, femoral, profunda femoral, popliteal and calf veins including the posterior tibial, peroneal and gastrocnemius veins when visible. The superficial great saphenous vein was also interrogated. Spectral Doppler was utilized to evaluate flow at rest and with distal augmentation maneuvers in the common femoral, femoral and popliteal veins. COMPARISON:  No prior. FINDINGS: RIGHT LOWER EXTREMITY Common Femoral Vein: No evidence of thrombus. Normal compressibility, respiratory phasicity and response to augmentation. Saphenofemoral Junction: No evidence of thrombus. Normal  compressibility and flow on color Doppler imaging. Profunda Femoral Vein: No evidence of thrombus. Normal compressibility and flow on color Doppler imaging. Femoral Vein: No evidence of thrombus. Normal compressibility, respiratory phasicity and response to augmentation. Popliteal Vein: No evidence of thrombus. Normal compressibility, respiratory phasicity and response to augmentation. Calf Veins: No evidence of thrombus. Normal compressibility and flow on color Doppler imaging. Superficial Great Saphenous Vein: No evidence of thrombus. Normal compressibility and flow on color Doppler imaging. Other Findings:  None. LEFT LOWER EXTREMITY Common Femoral Vein: No evidence of thrombus. Normal compressibility, respiratory phasicity and response to augmentation. Saphenofemoral Junction: No evidence of thrombus. Normal compressibility and flow on color Doppler imaging. Profunda Femoral Vein: No evidence of thrombus. Normal compressibility and flow on color Doppler imaging. Femoral Vein: No evidence of thrombus. Normal compressibility, respiratory phasicity and response to augmentation. Popliteal Vein: No evidence of thrombus. Normal compressibility, respiratory phasicity and response to augmentation. Calf Veins: No evidence of thrombus. Normal compressibility and flow on color Doppler imaging. Superficial Great Saphenous Vein: No evidence of thrombus. Normal compressibility and flow on color Doppler imaging. Other Findings:  None. IMPRESSION: No evidence of deep venous thrombosis. Electronically Signed   By: Marcello Moores  Register   On: 08/24/2016 12:51   Dg Hip Unilat With Pelvis 2-3 Views Left  Result Date: 08/23/2016 CLINICAL DATA:  from [place] following an unwitnessed fall. Pt got up out of wheelchair, got dizzy, went to sit down and missed wheelchair, Pt c/o low back pain, head pain, L hip pain EXAM: DG HIP (WITH OR WITHOUT PELVIS) 2-3V LEFT COMPARISON:  None. FINDINGS: No fracture.  No dislocation. There are  advanced arthropathic changes of the left hip with severe superior joint space narrowing, mild subchondral sclerosis and subchondral cystic change. Small marginal osteophytes noted from the base  of the left femoral head. There is mild axial right hip joint space narrowing. The SI joints and symphysis pubis are normally spaced and aligned. The bones are diffusely demineralized. Soft tissues are unremarkable. IMPRESSION: 1. No fracture, dislocation or acute finding. 2. 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 Electronically Signed   By: Lajean Manes M.D.   On: 08/23/2016 13:03    EKG:   Orders placed or performed during the hospital encounter of 08/23/16  . ED EKG  . ED EKG  . EKG 12-Lead  . EKG 12-Lead  . EKG 12-Lead  . EKG 12-Lead  . EKG 12-Lead  . EKG 12-Lead  . EKG 12-Lead  . EKG 12-Lead      Management plans discussed with the patient, and  daughter [name] over phone they are in agreement.  CODE STATUS:     Code Status Orders        Start     Ordered   08/23/16 1654  Full code  Continuous     08/23/16 1653    Code Status History    Date Active Date Inactive Code Status Order ID Comments User Context   08/25/2015  8:44 PM 08/27/2015  8:29 PM Full Code [id number]  Gladstone Lighter, MD Inpatient      TOTAL TIME TAKING CARE OF THIS PATIENT: 45  minutes.   Note: This dictation was prepared with Dragon dictation along with smaller phrase technology. Any transcriptional errors that result from this process are unintentional.   @[facility] @  on 08/26/2016 at 3:17 PM  Between 7am to 6pm - Pager - [phone]  After 6pm go to [email or web address] - password [email or web address] [facility]  [facility]  Office  [phone]  CC: Primary care physician; No primary care provider on file.

## 2016-08-26 NOTE — Progress Notes (Signed)
  Progress Note   Date: 08/26/2016  Patient Name: Tina [name]        MRN#: [id number]  Clarification of the diagnosis of heart failure: Chronic diastolic CHF    Kumari Sculley, Illene Silver, MD

## 2016-08-26 NOTE — Progress Notes (Signed)
Physical Therapy Treatment Patient Details Name: Tina [name] MRN: [id number] DOB: [birth year] Today's Date: 08/26/2016    History of Present Illness Pt is an 83 y.o. female s/p unwitnessed fall (got out of wheelchair, got dizzy, went to sit down and missed w/c).  Pt admitted to hospital with healthcare associated PNA, LE swelling, hypotension, hyponatremia, and fluid overloaded.  PMH includes anemia, anxiety, bronchitis, CAD, CKD, colon CA, COPD, 2 L home O2, DM, CHF.    PT Comments    Pt declining any out of bed mobility d/t abdominal and L hip pain (nursing notified) and required encouragement to participate in LE ex's in bed.  Pt c/o L hip pain with any L hip movement but overall tolerated ex's fairly well (with physical assist for L LE).  Will continue to progress pt with strengthening and functional mobility next session per pt tolerance.  Recommend pt discharge back to facility with 24/7 assist and HHPT (per SW notes pt at baseline stands and pivots; mostly w/c bound).   Follow Up Recommendations  Supervision/Assistance - 24 hour;Home health PT     Equipment Recommendations  None recommended by PT    Recommendations for Other Services       Precautions / Restrictions Precautions Precautions: Fall Restrictions Weight Bearing Restrictions: No    Mobility  Bed Mobility               General bed mobility comments: Pt declined all mobility d/t abdominal and L hip pain  Transfers                    Ambulation/Gait                 Stairs            Wheelchair Mobility    Modified Rankin (Stroke Patients Only)       Balance                                    Cognition Arousal/Alertness: Awake/alert Behavior During Therapy: Anxious Overall Cognitive Status: No family/caregiver present to determine baseline cognitive functioning (Oriented to person and hospital (not to time/situation))                       Exercises General Exercises - Lower Extremity Ankle Circles/Pumps: AROM;Strengthening;Both;Supine Quad Sets: AROM;Strengthening;Both;Supine Short Arc Quad: Strengthening;Both;Supine (AROM R; AAROM L) Heel Slides: Strengthening;Both;Supine (AROM R; AAROM L) Hip ABduction/ADduction: Strengthening;Both;Supine (AROM R; AAROM L)  10 reps x2 sets B LE's for all above ex's; vc's and tactile cues required for technique of ex's.    General Comments   Nursing cleared pt for participation in physical therapy.  Pt agreeable to PT session with encouragement.      Pertinent Vitals/Pain Pain Assessment: Faces Faces Pain Scale: Hurts even more Pain Location: abdominal and L hip pain Pain Descriptors / Indicators: Discomfort;Tender;Sore Pain Intervention(s): Limited activity within patient's tolerance;Monitored during session;Repositioned (RN notified regarding pain)  Vitals (HR and O2 on 2 L O2 via nasal cannula) stable and WFL throughout treatment session.    Home Living                      Prior Function            PT Goals (current goals can now be found in the care plan section) Acute Rehab PT Goals Patient Stated  Goal: to have less pain PT Goal Formulation: With patient Time For Goal Achievement: 09/07/16 Potential to Achieve Goals: Fair Progress towards PT goals: Progressing toward goals (with LE strengthening)    Frequency    Min 2X/week      PT Plan Current plan remains appropriate    Co-evaluation             End of Session Equipment Utilized During Treatment: Oxygen Activity Tolerance: Patient limited by pain Patient left: in bed;with call bell/phone within reach;with bed alarm set (B heels elevated via pillow)     Time: 1010-1033 PT Time Calculation (min) (ACUTE ONLY): 23 min  Charges:  $Therapeutic Exercise: 23-37 mins                    G CodesLeitha Bleak 09/05/16, 10:46 AM Leitha Bleak, [place]

## 2016-08-26 NOTE — Progress Notes (Signed)
SUBJECTIVE: Pt is resting in bed. She reports abdominal pain "like she is in labor" but denies chest pain.    Vitals:   08/25/16 2042 08/25/16 2049 08/26/16 0436 08/26/16 0824  BP: (!) 123/54  (!) 124/52 112/87  Pulse: 62  (!) 56 60  Resp: 20  (!) 24   Temp: 97.6 F (36.4 C)  98.5 F (36.9 C) 98.2 F (36.8 C)  TempSrc: Oral  Oral Oral  SpO2: 95% 94% 94% 97%  Weight:      Height:        Intake/Output Summary (Last 24 hours) at 08/26/16 0852 Last data filed at 08/26/16 0836  Gross per 24 hour  Intake              203 ml  Output             2900 ml  Net            -2697 ml    LABS: Basic Metabolic Panel:  Recent Labs  08/24/16 0521 08/25/16 0449  NA 135 134*  K 3.7 3.7  CL 96* 96*  CO2 32 32  GLUCOSE 109* 105*  BUN 18 15  CREATININE 1.40* 1.11*  CALCIUM 9.1 9.3   Liver Function Tests: No results for input(s): AST, ALT, ALKPHOS, BILITOT, PROT, ALBUMIN in the last 72 hours. No results for input(s): LIPASE, AMYLASE in the last 72 hours. CBC:  Recent Labs  08/23/16 1148 08/24/16 0521  WBC 20.9* 13.1*  HGB 13.1 11.8*  HCT 39.6 37.0  MCV 87.3 88.6  PLT 217 186   Cardiac Enzymes:  Recent Labs  08/23/16 2240 08/24/16 0521 08/24/16 1317  TROPONINI <0.03 0.17* <0.03   BNP: Invalid input(s): POCBNP D-Dimer: No results for input(s): DDIMER in the last 72 hours. Hemoglobin A1C:  Recent Labs  08/23/16 1715  HGBA1C 5.6   Fasting Lipid Panel: No results for input(s): CHOL, HDL, LDLCALC, TRIG, CHOLHDL, LDLDIRECT in the last 72 hours. Thyroid Function Tests:  Recent Labs  08/23/16 1715  TSH 0.981   Anemia Panel: No results for input(s): VITAMINB12, FOLATE, FERRITIN, TIBC, IRON, RETICCTPCT in the last 72 hours.   PHYSICAL EXAM General: Well developed, well nourished, in no acute distress HEENT:  Normocephalic and atramatic Neck:  No JVD.  Lungs: Clear bilaterally to auscultation and percussion. Heart: Irregular rhythm, borderline tachycardic.   Abdomen: Bowel sounds are positive, abdomen soft and non-tender  Msk:  Back normal, gait not tested. Normal strength and tone for age. Extremities: No clubbing, cyanosis or edema.   Neuro: Alert and oriented X 3. Psych:  Good affect, responds appropriately  TELEMETRY: Atrial fibrilation rate 100bpm  ASSESSMENT AND PLAN: Irregular heart rate with borderline tachycardia at rest. Pt denies chest pain but reports continued abdominal pain. Ordered 50mg  metoprolol for rate control. Plan of care discussed with Dr. Neoma Laming.    Active Problems:   Healthcare-associated pneumonia   Hypotension   Leukocytosis   Hyponatremia   Generalized weakness   Leg swelling    Jake Bathe, NP-C 08/26/2016 8:52 AM

## 2016-08-26 NOTE — NC FL2 (Signed)
[place] LEVEL OF CARE SCREENING TOOL     IDENTIFICATION  Patient Name: Tina [name] Birthdate: 1928-04-22 Sex: female Admission Date (Current Location): 08/23/2016  [place] and [place] Number:  Engineering geologist and Address:  [facility], [address], [place], [place] [address]      Provider Number: [id number]  Attending Physician Name and Address:  [name], MD  Relative Name and Phone Number:       Current Level of Care: Hospital Recommended Level of Care: [place] Prior Approval Number:    Date Approved/Denied:   PASRR Number:    Discharge Plan:  (ALF)    Current Diagnoses: Patient Active Problem List   Diagnosis Date Noted  . Healthcare-associated pneumonia 08/23/2016  . Hypotension 08/23/2016  . Leukocytosis 08/23/2016  . Hyponatremia 08/23/2016  . Generalized weakness 08/23/2016  . Leg swelling 08/23/2016  . Pressure ulcer 08/26/2015  . CHF (congestive heart failure) ([place]) 08/25/2015    Orientation RESPIRATION BLADDER Height & Weight     Self  Normal   2 liters O2 Incontinent Weight: 230 lb 11.2 oz (104.6 kg) Height:  5\' 7"  (170.2 cm)  BEHAVIORAL SYMPTOMS/MOOD NEUROLOGICAL BOWEL NUTRITION STATUS   (none)  (none) Continent Diet (dysphagia 3)  AMBULATORY STATUS COMMUNICATION OF NEEDS Skin   Extensive Assist Verbally Normal                       Personal Care Assistance Level of Assistance  Bathing, Dressing Bathing Assistance: Limited assistance Feeding assistance: Independent Dressing Assistance: Limited assistance     Functional Limitations Info  Sight, Hearing Sight Info: Impaired Hearing Info: Impaired      SPECIAL CARE FACTORS FREQUENCY  PT (By licensed PT) (home health to follow)                    Contractures Contractures Info: Not present    Additional Factors Info  Allergies   Allergies Info: products containing corn           DISCHARGE  MEDICATIONS:      Current Discharge Medication List       START taking these medications   Details  amoxicillin-clavulanate (AUGMENTIN) 875-125 MG tablet Take 1 tablet by mouth 2 (two) times daily. Qty: 14 tablet, Refills: 0    insulin aspart (NOVOLOG) 100 UNIT/ML injection Inject 4 Units into the skin 3 (three) times daily with meals. Qty: 10 mL, Refills: 11    metoprolol succinate (TOPROL-XL) 50 MG 24 hr tablet Take 1 tablet (50 mg total) by mouth daily. Take with or immediately following a meal. Qty: 30 tablet, Refills: 0    ondansetron (ZOFRAN) 4 MG tablet Take 1 tablet (4 mg total) by mouth every 6 (six) hours as needed for nausea. Qty: 20 tablet, Refills: 0    oxyCODONE (OXY IR/ROXICODONE) 5 MG immediate release tablet Take 1 tablet (5 mg total) by mouth every 4 (four) hours as needed for moderate pain or breakthrough pain. Qty: 30 tablet, Refills: 0    saccharomyces boulardii (FLORASTOR) 250 MG capsule Take 1 capsule (250 mg total) by mouth 2 (two) times daily. Qty: 30 capsule, Refills: 0    tiotropium (SPIRIVA) 18 MCG inhalation capsule Place 1 capsule (18 mcg total) into inhaler and inhale daily. Qty: 30 capsule, Refills: 12         CONTINUE these medications which have NOT CHANGED   Details  acetaminophen (TYLENOL) 500 MG tablet  Take 500 mg by mouth every 4 (four) hours as needed for mild pain, fever or headache.    albuterol (PROVENTIL HFA;VENTOLIN HFA) 108 (90 Base) MCG/ACT inhaler Inhale 2 puffs into the lungs every 4 (four) hours as needed for wheezing or shortness of breath.    alum & mag hydroxide-simeth (MAALOX PLUS) 400-400-40 MG/5ML suspension Take 30 mLs by mouth every 6 (six) hours as needed for indigestion.    aspirin 81 MG chewable tablet Chew 81 mg by mouth daily.    buPROPion (WELLBUTRIN XL) 150 MG 24 hr tablet Take 150 mg by mouth daily.    calcium carbonate (TUMS - DOSED IN MG ELEMENTAL CALCIUM) 500 MG chewable tablet Chew 1  tablet by mouth 2 (two) times daily.    cetaphil (CETAPHIL) lotion Apply 1 application topically as needed for dry skin.    Cholecalciferol (VITAMIN D) 2000 units CAPS Take 2,000 Units by mouth daily.    clotrimazole (LOTRIMIN) 1 % cream Apply 1 application topically as needed (for rash under both breasts and left groin).    docusate sodium (COLACE) 100 MG capsule Take 100 mg by mouth 2 (two) times daily.    donepezil (ARICEPT) 5 MG tablet Take 5 mg by mouth daily.    ferrous sulfate 325 (65 FE) MG tablet Take 325 mg by mouth 3 (three) times a week. Take 325 mg by mouth on Monday, Wednesday and Friday.    Fluticasone-Salmeterol (ADVAIR) 250-50 MCG/DOSE AEPB Inhale 1 puff into the lungs 2 (two) times daily.    furosemide (LASIX) 40 MG tablet Take 40 mg by mouth 2 (two) times daily. Take 40 mg by mouth twice daily at 6 AM and 4 PM.    guaiFENesin (MUCINEX) 600 MG 12 hr tablet Take 600 mg by mouth 2 (two) times daily.    guaifenesin (ROBITUSSIN) 100 MG/5ML syrup Take 200 mg by mouth every 6 (six) hours as needed for cough.    insulin glargine (LANTUS) 100 UNIT/ML injection Inject 10 Units into the skin at bedtime.    insulin regular (NOVOLIN R,HUMULIN R) 100 units/mL injection Inject 0-14 Units into the skin 2 (two) times daily as needed for high blood sugar (Per sliding scale). Inject twice daily (in the morning before morning meal and at bedtime before Lantus) as needed per sliding scale:    0-200:  0 units  201-250:  8 units  251-300:  10 units  301-350:  12 units  351-400:  14 units  Greater than 400 or less than 60:  Call MD    ipratropium-albuterol (DUONEB) 0.5-2.5 (3) MG/3ML SOLN Take 3 mLs by nebulization every 6 (six) hours as needed (for wheezing/shortness of breath).    lactulose, encephalopathy, (CHRONULAC) 10 GM/15ML SOLN Take 20 g by mouth daily.    levothyroxine (SYNTHROID, LEVOTHROID) 112 MCG tablet Take 112 mcg by mouth daily before breakfast.     loperamide (IMODIUM) 2 MG capsule Take 2 mg by mouth as needed for diarrhea or loose stools.    LORazepam (ATIVAN) 0.5 MG tablet Take 0.5 mg by mouth every 8 (eight) hours as needed (for agitation).    magnesium hydroxide (MILK OF MAGNESIA) 400 MG/5ML suspension Take 30 mLs by mouth at bedtime as needed for mild constipation.    neomycin-bacitracin-polymyxin (NEOSPORIN) [phone] ointment Apply 1 application topically as needed (for wound care).    nystatin (MYCOSTATIN/NYSTOP) 100000 UNIT/GM POWD Apply 1 g topically 2 (two) times daily as needed (for dry skin).    omeprazole (PRILOSEC) 20 MG  capsule Take 20 mg by mouth daily.    polyvinyl alcohol (LIQUIFILM TEARS) 1.4 % ophthalmic solution Place 1 drop into both eyes 2 (two) times daily.    potassium chloride SA (K-DUR,KLOR-CON) 20 MEQ tablet Take 20 mEq by mouth daily.    sertraline (ZOLOFT) 50 MG tablet Take 50 mg by mouth daily. Take 50 mg by mouth daily at 8 PM.    simethicone (MYLICON) 80 MG chewable tablet Chew 80 mg by mouth every 8 (eight) hours.    Skin Protectants, Misc. (BAZA PROTECT EX) Apply 1 application topically 2 (two) times daily as needed (for redness or open wounds).         Shela Leff, [place]

## 2016-09-06 ENCOUNTER — Ambulatory Visit: Payer: Medicare Other | Admitting: Gastroenterology

## 2016-09-28 ENCOUNTER — Ambulatory Visit (INDEPENDENT_AMBULATORY_CARE_PROVIDER_SITE_OTHER): Payer: Medicare Other | Admitting: Gastroenterology

## 2016-09-28 ENCOUNTER — Encounter: Payer: Self-pay | Admitting: Gastroenterology

## 2016-09-28 ENCOUNTER — Ambulatory Visit: Payer: Medicare Other | Admitting: Gastroenterology

## 2016-09-28 VITALS — BP 98/68

## 2016-09-28 DIAGNOSIS — R1030 Lower abdominal pain, unspecified: Secondary | ICD-10-CM

## 2016-09-28 NOTE — Progress Notes (Signed)
Gastroenterology Consultation  Referring Provider:   Dr Sula Rumple Primary Care Physician:  No primary care provider on file. Primary Gastroenterologist:  Dr. Jonathon [name]  Reason for Consultation:     Abdominal pain         HPI:   Tina [name] is a 83 y.o. y/o female referred for consultation & management  by Dr. Rayne [name] primary care provider on file..    She is here today for a hospital follow up . He was discharged on 08/26/16 when she was admitted with HCAP and treated with antibiotics . He also had AKI.   In the discharge summary mentions that She has abdominal pains and would be referred to Gi. GI was not consulted during the hospitalization.    USG abdomen showed tiny gall stones , fatty liver, distal abdominal aortic bulge at 2.6 cm .During the hospitalization , LFT's were normal .hb 11.9 .  Since discharge she says she is doing better. On oxygen as needed. She quit smoking at age 63   Abdominal pain: Onset: several years, on and off Site :lower abdomen , "around the bladder, around the sides of her abdomen . Has mild pain once a week , lasts sometimes upto a day Radiation: localized Severity :says upto 7/10, presently none  . Overall daughter feels the apin is better  Tina [name] of pain: sometimes like a knife  Aggravating factors: no factors Relieving factors :laying down  Weight loss: about the same  NSAID use: no  PPI use :omeprazole  Gall bladder surgery: no  Frequency of bowel movements: daily , normal consistency  Change in bowel movements: no  Relief with bowel movements: yes  Gas/Bloating/Abdominal distension: sometimes.     Past Medical History:  Diagnosis Date  . Anemia   . Anxiety   . Bronchitis   . CAD (coronary artery disease)    s/p PCI  . CKD stage 3 due to type 2 diabetes mellitus ([place])   . Colon cancer ([place])    treated, surgery, chemo, radiation  . COPD (chronic obstructive pulmonary disease) ([facility])    on 2L home o2  . Diabetes mellitus without  complication ([place])   . Obesity   . Osteoarthritis   . Renal disorder     Past Surgical History:  Procedure Laterality Date  . ABDOMINAL HYSTERECTOMY    . CATARACT EXTRACTION, BILATERAL    . PARTIAL COLECTOMY      Prior to Admission medications   Medication Sig Start Date End Date Taking? Authorizing Provider  acetaminophen (TYLENOL) 500 MG tablet Take 500 mg by mouth every 4 (four) hours as needed for mild pain, fever or headache.   Yes Historical Provider, MD  albuterol (PROVENTIL HFA;VENTOLIN HFA) 108 (90 Base) MCG/ACT inhaler Inhale 2 puffs into the lungs every 4 (four) hours as needed for wheezing or shortness of breath.   Yes Historical Provider, MD  alum & mag hydroxide-simeth (MAALOX PLUS) 400-400-40 MG/5ML suspension Take 30 mLs by mouth every 6 (six) hours as needed for indigestion.   Yes Historical Provider, MD  aspirin 81 MG chewable tablet Chew 81 mg by mouth daily.   Yes Historical Provider, MD  buPROPion (WELLBUTRIN XL) 150 MG 24 hr tablet Take 150 mg by mouth daily.   Yes Historical Provider, MD  calcium carbonate (TUMS - DOSED IN MG ELEMENTAL CALCIUM) 500 MG chewable tablet Chew 1 tablet by mouth 2 (two) times daily.   Yes Historical Provider, MD  cetaphil (CETAPHIL) lotion Apply 1 application  topically as needed for dry skin.   Yes Historical Provider, MD  Cholecalciferol (VITAMIN D) 2000 units CAPS Take 2,000 Units by mouth daily.   Yes Historical Provider, MD  clotrimazole (LOTRIMIN) 1 % cream Apply 1 application topically as needed (for rash under both breasts and left groin).   Yes Historical Provider, MD  docusate sodium (COLACE) 100 MG capsule Take 100 mg by mouth 2 (two) times daily.   Yes Historical Provider, MD  donepezil (ARICEPT) 5 MG tablet Take 5 mg by mouth daily.   Yes Historical Provider, MD  ferrous sulfate 325 (65 FE) MG tablet Take 325 mg by mouth 3 (three) times a week. Take 325 mg by mouth on Monday, Wednesday and Friday.   Yes Historical Provider, MD    Fluticasone-Salmeterol (ADVAIR) 250-50 MCG/DOSE AEPB Inhale 1 puff into the lungs 2 (two) times daily.   Yes Historical Provider, MD  furosemide (LASIX) 40 MG tablet Take 40 mg by mouth 2 (two) times daily. Take 40 mg by mouth twice daily at 6 AM and 4 PM.   Yes Historical Provider, MD  guaiFENesin (MUCINEX) 600 MG 12 hr tablet Take 600 mg by mouth 2 (two) times daily.   Yes Historical Provider, MD  insulin glargine (LANTUS) 100 UNIT/ML injection Inject 10 Units into the skin at bedtime.   Yes Historical Provider, MD  insulin regular (NOVOLIN R,HUMULIN R) 100 units/mL injection Inject 0-14 Units into the skin 2 (two) times daily as needed for high blood sugar (Per sliding scale). Inject twice daily (in the morning before morning meal and at bedtime before Lantus) as needed per sliding scale:    0-200:  0 units  201-250:  8 units  251-300:  10 units  301-350:  12 units  351-400:  14 units  Greater than 400 or less than 60:  Call MD   Yes Historical Provider, MD  ipratropium-albuterol (DUONEB) 0.5-2.5 (3) MG/3ML SOLN Take 3 mLs by nebulization every 6 (six) hours as needed (for wheezing/shortness of breath).   Yes Historical Provider, MD  lactulose, encephalopathy, (CHRONULAC) 10 GM/15ML SOLN Take 20 g by mouth daily.   Yes Historical Provider, MD  levothyroxine (SYNTHROID, LEVOTHROID) 112 MCG tablet Take 112 mcg by mouth daily before breakfast.   Yes Historical Provider, MD  loperamide (IMODIUM) 2 MG capsule Take 2 mg by mouth as needed for diarrhea or loose stools.   Yes Historical Provider, MD  LORazepam (ATIVAN) 0.5 MG tablet Take 0.5 mg by mouth every 8 (eight) hours as needed (for agitation).   Yes Historical Provider, MD  magnesium hydroxide (MILK OF MAGNESIA) 400 MG/5ML suspension Take 30 mLs by mouth at bedtime as needed for mild constipation.   Yes Historical Provider, MD  metoprolol succinate (TOPROL-XL) 50 MG 24 hr tablet Take 1 tablet (50 mg total) by mouth daily. Take with or  immediately following a meal. 08/27/16  Yes Tina Mango, MD  neomycin-bacitracin-polymyxin (NEOSPORIN) [phone] ointment Apply 1 application topically as needed (for wound care).   Yes Historical Provider, MD  nystatin (MYCOSTATIN/NYSTOP) 100000 UNIT/GM POWD Apply 1 g topically 2 (two) times daily as needed (for dry skin).   Yes Historical Provider, MD  omeprazole (PRILOSEC) 20 MG capsule Take 20 mg by mouth daily.   Yes Historical Provider, MD  ondansetron (ZOFRAN) 4 MG tablet Take 1 tablet (4 mg total) by mouth every 6 (six) hours as needed for nausea. 08/26/16  Yes Tina Mango, MD  oxyCODONE (OXY IR/ROXICODONE) 5 MG immediate release tablet  Take 1 tablet (5 mg total) by mouth every 4 (four) hours as needed for moderate pain or breakthrough pain. 08/26/16  Yes Tina Mango, MD  polyvinyl alcohol (LIQUIFILM TEARS) 1.4 % ophthalmic solution Place 1 drop into both eyes 2 (two) times daily.   Yes Historical Provider, MD  potassium chloride SA (K-DUR,KLOR-CON) 20 MEQ tablet Take 20 mEq by mouth daily.   Yes Historical Provider, MD  saccharomyces boulardii (FLORASTOR) 250 MG capsule Take 1 capsule (250 mg total) by mouth 2 (two) times daily. 08/26/16  Yes Tina Mango, MD  sertraline (ZOLOFT) 50 MG tablet Take 50 mg by mouth daily. Take 50 mg by mouth daily at 8 PM.   Yes Historical Provider, MD  simethicone (MYLICON) 80 MG chewable tablet Chew 80 mg by mouth every 8 (eight) hours.   Yes Historical Provider, MD  Skin Protectants, Misc. (BAZA PROTECT EX) Apply 1 application topically 2 (two) times daily as needed (for redness or open wounds).   Yes Historical Provider, MD  tiotropium (SPIRIVA) 18 MCG inhalation capsule Place 1 capsule (18 mcg total) into inhaler and inhale daily. 08/27/16  Yes Tina Mango, MD  amoxicillin-clavulanate (AUGMENTIN) 875-125 MG tablet Take 1 tablet by mouth 2 (two) times daily. Patient not taking: Reported on 09/28/2016 08/26/16   Tina Mango, MD  guaifenesin (ROBITUSSIN) 100 MG/5ML syrup  Take 200 mg by mouth every 6 (six) hours as needed for cough.    Historical Provider, MD  insulin aspart (NOVOLOG) 100 UNIT/ML injection Inject 4 Units into the skin 3 (three) times daily with meals. Patient not taking: Reported on 09/28/2016 08/26/16   Tina Mango, MD    Family History  Problem Relation Age of Onset  . Diabetes Mellitus II Mother      Social History  Substance Use Topics  . Smoking status: Former Research scientist (life sciences)  . Smokeless tobacco: Never Used     Comment: quit 20 years ago  . Alcohol use No    Allergies as of 09/28/2016 - Review Complete 08/23/2016  Allergen Reaction Noted  . Sulfamethoxazole-trimethoprim Other (See Comments)   . Aspirin Other (See Comments)   . Clarithromycin Other (See Comments)   . Codeine Other (See Comments)   . Corn-containing products Other (See Comments) 08/25/2015  . Penicillins Other (See Comments)     Review of Systems:    All systems reviewed and negative except where noted in HPI.   Physical Exam:  BP 98/68  No LMP recorded. Patient is postmenopausal. Psych:  Alert and cooperative. Normal mood and affect, sitting in a wheel chair . General:   Alert, obese Well-developed, well-nourished, pleasant and cooperative in NAD Head:  Normocephalic and atraumatic. Eyes:  Sclera clear, no icterus.   Conjunctiva pink. Ears:  Normal auditory acuity. Nose:  No deformity, discharge, or lesions. Mouth:  No deformity or lesions,oropharynx pink & dry  Neck:  Supple; no masses or thyromegaly. Lungs:  Respirations even and unlabored.  Clear throughout to auscultation.   No wheezes, crackles, or rhonchi. No acute distress. Heart:  Regular rate and rhythm; systolic murmur in the aortic area Abdomen:  Normal bowel sounds.  No bruits.  Soft, non-tender and non-distended without masses, hepatosplenomegaly or hernias noted.  No guarding or rebound tenderness.    Extremities:  3+ b/l pitting edema .  No cyanosis. Neurologic:  Alert and oriented x3;  grossly  normal neurologically. Skin:  Intact without significant lesions or rashes. No jaundice. Lymph Nodes:  No significant cervical adenopathy. Psych:  Alert and  cooperative. Normal mood and affect.  Imaging Studies: No results found.  Assessment and Plan:    PRAJNA VANDERPOOL 83 y.o. female has ben referred for abdominal pain. Her pain is very long standing and non specific in description. It fits criteria for IBS-C. Presently she is doing well. Pain does not sound biliary in nature. At present I discussed that the risks of any endoscopic procedures outweigh the benefits . She will call me if she needs to see me with regards to the abdominal pain again. Suggested to use milk of magnesia prn or milaralax     Follow up as needed   Dr Tina Bellows MD

## 2016-10-24 ENCOUNTER — Telehealth: Payer: Self-pay | Admitting: Obstetrics & Gynecology

## 2016-10-24 NOTE — Telephone Encounter (Signed)
[facility] calls Referring pt to be Evaluate for Post meno Bleeding had hysterectomy years ago may have cervix possible atrophy. Called Pt daughter Tina [name] to schedule appt. Pt's daughter stated she would call back to schedule appt if pt needs to been seen. CB# [phone]

## 2016-11-15 ENCOUNTER — Encounter: Payer: Self-pay | Admitting: Obstetrics & Gynecology

## 2016-11-15 ENCOUNTER — Ambulatory Visit (INDEPENDENT_AMBULATORY_CARE_PROVIDER_SITE_OTHER): Payer: Medicare Other | Admitting: Obstetrics & Gynecology

## 2016-11-15 VITALS — BP 130/80 | HR 54 | Ht 67.0 in

## 2016-11-15 DIAGNOSIS — N952 Postmenopausal atrophic vaginitis: Secondary | ICD-10-CM

## 2016-11-15 DIAGNOSIS — N939 Abnormal uterine and vaginal bleeding, unspecified: Secondary | ICD-10-CM | POA: Diagnosis not present

## 2016-11-15 NOTE — Progress Notes (Signed)
Postmenopausal Bleeding Patient complains of vaginal bleeding. She has been menopausal for several years. Currently on no HRT. Bleeding is described as scant staining and has occurred several times. Other menopausal symptoms include: none. Workup to date: none. Hysterectomy years ago.  No vag itch or burn.  Occas GSI.  Menstrual History: OB History    Gravida Para Term Preterm AB Living   5 4 4   1 4    SAB TAB Ectopic Multiple Live Births   1             No LMP recorded. Patient is postmenopausal.   PMHx: She  has a past medical history of Anemia; Anxiety; Bronchitis; CAD (coronary artery disease); CKD stage 3 due to type 2 diabetes mellitus ([place]); Colon cancer ([place]); COPD (chronic obstructive pulmonary disease) ([place]); Diabetes mellitus without complication ([place]); Obesity; Osteoarthritis; and Renal disorder. Also,  has a past surgical history that includes Abdominal hysterectomy; Partial colectomy; and Cataract extraction, bilateral., family history includes Diabetes Mellitus II in her mother.,  reports that she has quit smoking. She has never used smokeless tobacco. She reports that she does not drink alcohol or use drugs.  She has a current medication list which includes the following prescription(s): acetaminophen, albuterol, alum & mag hydroxide-simeth, amoxicillin-clavulanate, aspirin, bupropion, calcium carbonate, cetaphil, vitamin d, clotrimazole, docusate sodium, donepezil, ferrous sulfate, fluticasone-salmeterol, furosemide, guaifenesin, guaifenesin, insulin aspart, insulin glargine, insulin regular, ipratropium-albuterol, lactulose (encephalopathy), levothyroxine, loperamide, lorazepam, magnesium hydroxide, metoprolol succinate, neomycin-bacitracin-polymyxin, nystatin, omeprazole, ondansetron, oxycodone, polyvinyl alcohol, potassium chloride sa, saccharomyces boulardii, sertraline, simethicone, skin protectants, misc., and tiotropium. Also, is allergic to sulfamethoxazole-trimethoprim;  aspirin; clarithromycin; codeine; corn-containing products; and penicillins.  Review of Systems  Constitutional: Negative for chills, fever and malaise/fatigue.  HENT: Negative for congestion, sinus pain and sore throat.   Eyes: Negative for blurred vision and pain.  Respiratory: Negative for cough and wheezing.   Cardiovascular: Negative for chest pain and leg swelling.  Gastrointestinal: Negative for abdominal pain, constipation, diarrhea, heartburn, nausea and vomiting.  Genitourinary: Negative for dysuria, frequency, hematuria and urgency.  Musculoskeletal: Negative for back pain, joint pain, myalgias and neck pain.  Skin: Negative for itching and rash.  Neurological: Negative for dizziness, tremors and weakness.  Endo/Heme/Allergies: Does not bruise/bleed easily.  Psychiatric/Behavioral: Negative for depression. The patient is not nervous/anxious and does not have insomnia.     Objective: BP 130/80   Pulse (!) 54   Ht 5\' 7"  (1.702 m)  Physical Exam  Constitutional: She is oriented to person, place, and time. She appears well-developed and well-nourished. No distress.  Genitourinary: Rectum normal. Pelvic exam was performed with patient supine. There is no rash, tenderness or lesion on the right labia. There is no rash, tenderness or lesion on the left labia. Vagina exhibits no lesion. No erythema or bleeding in the vagina. No foreign body in the vagina. Vaginal discharge found. Right adnexum does not display mass and does not display tenderness. Left adnexum does not display mass and does not display tenderness.  Genitourinary Comments: Atrophy No polyp, lesion, or laceration  Abdominal: Soft. She exhibits no distension. There is no tenderness.  Musculoskeletal: Normal range of motion.  Neurological: She is alert and oriented to person, place, and time. No cranial nerve deficit.  Skin: Skin is warm and dry.  Psychiatric: She has a normal mood and affect.    ASSESSMENT/PLAN:       Visit Diagnoses    Abnormal vaginal bleeding    -  Primary   Relevant Orders  NuSwab Vaginitis (VG)   Vaginal atrophy       Relevant Orders   NuSwab Vaginitis (VG)    Discussed vag tx for atrophy such as vag estrogen or Replens, none desired or recommended currently. f/u PRN  Barnett Applebaum, MD, [facility], [facility] 11/15/2016  11:49 AM

## 2016-11-18 LAB — NUSWAB VAGINITIS (VG)
CANDIDA ALBICANS, NAA: NEGATIVE
Candida glabrata, NAA: NEGATIVE
Trich vag by NAA: NEGATIVE

## 2018-07-29 ENCOUNTER — Emergency Department: Payer: Medicare Other

## 2018-07-29 ENCOUNTER — Other Ambulatory Visit: Payer: Self-pay

## 2018-07-29 ENCOUNTER — Inpatient Hospital Stay
Admission: EM | Admit: 2018-07-29 | Discharge: 2018-08-10 | DRG: 291 | Disposition: A | Discharge status: Hospice | Payer: Medicare Other | Attending: Internal Medicine | Admitting: Internal Medicine

## 2018-07-29 DIAGNOSIS — J441 Chronic obstructive pulmonary disease with (acute) exacerbation: Secondary | ICD-10-CM | POA: Diagnosis not present

## 2018-07-29 DIAGNOSIS — F0390 Unspecified dementia without behavioral disturbance: Secondary | ICD-10-CM | POA: Diagnosis present

## 2018-07-29 DIAGNOSIS — Z9049 Acquired absence of other specified parts of digestive tract: Secondary | ICD-10-CM | POA: Diagnosis not present

## 2018-07-29 DIAGNOSIS — Z79899 Other long term (current) drug therapy: Secondary | ICD-10-CM

## 2018-07-29 DIAGNOSIS — I5023 Acute on chronic systolic (congestive) heart failure: Secondary | ICD-10-CM | POA: Diagnosis not present

## 2018-07-29 DIAGNOSIS — G4700 Insomnia, unspecified: Secondary | ICD-10-CM | POA: Diagnosis not present

## 2018-07-29 DIAGNOSIS — Z7982 Long term (current) use of aspirin: Secondary | ICD-10-CM

## 2018-07-29 DIAGNOSIS — N184 Chronic kidney disease, stage 4 (severe): Secondary | ICD-10-CM | POA: Diagnosis not present

## 2018-07-29 DIAGNOSIS — Z85038 Personal history of other malignant neoplasm of large intestine: Secondary | ICD-10-CM

## 2018-07-29 DIAGNOSIS — Z7189 Other specified counseling: Secondary | ICD-10-CM

## 2018-07-29 DIAGNOSIS — R0602 Shortness of breath: Secondary | ICD-10-CM | POA: Diagnosis not present

## 2018-07-29 DIAGNOSIS — Z9981 Dependence on supplemental oxygen: Secondary | ICD-10-CM

## 2018-07-29 DIAGNOSIS — N179 Acute kidney failure, unspecified: Secondary | ICD-10-CM | POA: Diagnosis not present

## 2018-07-29 DIAGNOSIS — I4891 Unspecified atrial fibrillation: Secondary | ICD-10-CM

## 2018-07-29 DIAGNOSIS — Z90710 Acquired absence of both cervix and uterus: Secondary | ICD-10-CM | POA: Diagnosis not present

## 2018-07-29 DIAGNOSIS — I509 Heart failure, unspecified: Secondary | ICD-10-CM

## 2018-07-29 DIAGNOSIS — Z515 Encounter for palliative care: Secondary | ICD-10-CM | POA: Diagnosis not present

## 2018-07-29 DIAGNOSIS — Z886 Allergy status to analgesic agent status: Secondary | ICD-10-CM

## 2018-07-29 DIAGNOSIS — J9621 Acute and chronic respiratory failure with hypoxia: Secondary | ICD-10-CM | POA: Diagnosis present

## 2018-07-29 DIAGNOSIS — D638 Anemia in other chronic diseases classified elsewhere: Secondary | ICD-10-CM | POA: Diagnosis present

## 2018-07-29 DIAGNOSIS — Z79891 Long term (current) use of opiate analgesic: Secondary | ICD-10-CM

## 2018-07-29 DIAGNOSIS — I959 Hypotension, unspecified: Secondary | ICD-10-CM | POA: Diagnosis not present

## 2018-07-29 DIAGNOSIS — I5043 Acute on chronic combined systolic (congestive) and diastolic (congestive) heart failure: Secondary | ICD-10-CM | POA: Diagnosis present

## 2018-07-29 DIAGNOSIS — E860 Dehydration: Secondary | ICD-10-CM | POA: Diagnosis present

## 2018-07-29 DIAGNOSIS — Z9119 Patient's noncompliance with other medical treatment and regimen: Secondary | ICD-10-CM

## 2018-07-29 DIAGNOSIS — R7989 Other specified abnormal findings of blood chemistry: Secondary | ICD-10-CM | POA: Diagnosis not present

## 2018-07-29 DIAGNOSIS — R0902 Hypoxemia: Secondary | ICD-10-CM

## 2018-07-29 DIAGNOSIS — I130 Hypertensive heart and chronic kidney disease with heart failure and stage 1 through stage 4 chronic kidney disease, or unspecified chronic kidney disease: Principal | ICD-10-CM | POA: Diagnosis present

## 2018-07-29 DIAGNOSIS — I2510 Atherosclerotic heart disease of native coronary artery without angina pectoris: Secondary | ICD-10-CM | POA: Diagnosis not present

## 2018-07-29 DIAGNOSIS — M79605 Pain in left leg: Secondary | ICD-10-CM | POA: Diagnosis not present

## 2018-07-29 DIAGNOSIS — E876 Hypokalemia: Secondary | ICD-10-CM | POA: Diagnosis present

## 2018-07-29 DIAGNOSIS — Z6841 Body Mass Index (BMI) 40.0 and over, adult: Secondary | ICD-10-CM | POA: Diagnosis not present

## 2018-07-29 DIAGNOSIS — Z91018 Allergy to other foods: Secondary | ICD-10-CM

## 2018-07-29 DIAGNOSIS — Z993 Dependence on wheelchair: Secondary | ICD-10-CM

## 2018-07-29 DIAGNOSIS — M79604 Pain in right leg: Secondary | ICD-10-CM | POA: Diagnosis not present

## 2018-07-29 DIAGNOSIS — I252 Old myocardial infarction: Secondary | ICD-10-CM | POA: Diagnosis not present

## 2018-07-29 DIAGNOSIS — Z882 Allergy status to sulfonamides status: Secondary | ICD-10-CM

## 2018-07-29 DIAGNOSIS — Z880 Allergy status to penicillin: Secondary | ICD-10-CM

## 2018-07-29 DIAGNOSIS — Z794 Long term (current) use of insulin: Secondary | ICD-10-CM

## 2018-07-29 DIAGNOSIS — I484 Atypical atrial flutter: Secondary | ICD-10-CM | POA: Diagnosis not present

## 2018-07-29 DIAGNOSIS — F419 Anxiety disorder, unspecified: Secondary | ICD-10-CM | POA: Diagnosis present

## 2018-07-29 DIAGNOSIS — Z885 Allergy status to narcotic agent status: Secondary | ICD-10-CM

## 2018-07-29 DIAGNOSIS — I5021 Acute systolic (congestive) heart failure: Secondary | ICD-10-CM | POA: Diagnosis not present

## 2018-07-29 DIAGNOSIS — I248 Other forms of acute ischemic heart disease: Secondary | ICD-10-CM | POA: Diagnosis not present

## 2018-07-29 DIAGNOSIS — Z9861 Coronary angioplasty status: Secondary | ICD-10-CM

## 2018-07-29 DIAGNOSIS — E1122 Type 2 diabetes mellitus with diabetic chronic kidney disease: Secondary | ICD-10-CM | POA: Diagnosis present

## 2018-07-29 DIAGNOSIS — I4819 Other persistent atrial fibrillation: Secondary | ICD-10-CM | POA: Diagnosis present

## 2018-07-29 DIAGNOSIS — Z833 Family history of diabetes mellitus: Secondary | ICD-10-CM

## 2018-07-29 DIAGNOSIS — Z7401 Bed confinement status: Secondary | ICD-10-CM

## 2018-07-29 DIAGNOSIS — Z87891 Personal history of nicotine dependence: Secondary | ICD-10-CM

## 2018-07-29 DIAGNOSIS — Z7901 Long term (current) use of anticoagulants: Secondary | ICD-10-CM

## 2018-07-29 HISTORY — DX: Unspecified diastolic (congestive) heart failure: I50.30

## 2018-07-29 LAB — CBC
HEMATOCRIT: 39.6 % (ref 36.0–46.0)
Hemoglobin: 10.9 g/dL — ABNORMAL LOW (ref 12.0–15.0)
MCH: 24.3 pg — AB (ref 26.0–34.0)
MCHC: 27.5 g/dL — AB (ref 30.0–36.0)
MCV: 88.2 fL (ref 80.0–100.0)
NRBC: 0.9 % — AB (ref 0.0–0.2)
Platelets: 242 10*3/uL (ref 150–400)
RBC: 4.49 MIL/uL (ref 3.87–5.11)
RDW: 17.7 % — AB (ref 11.5–15.5)
WBC: 8 10*3/uL (ref 4.0–10.5)

## 2018-07-29 LAB — BASIC METABOLIC PANEL
Anion gap: 9 (ref 5–15)
BUN: 29 mg/dL — AB (ref 8–23)
CO2: 29 mmol/L (ref 22–32)
CREATININE: 1.92 mg/dL — AB (ref 0.44–1.00)
Calcium: 8.7 mg/dL — ABNORMAL LOW (ref 8.9–10.3)
Chloride: 100 mmol/L (ref 98–111)
GFR calc Af Amer: 26 mL/min — ABNORMAL LOW (ref >60)
GFR calc non Af Amer: 23 mL/min — ABNORMAL LOW (ref >60)
GLUCOSE: 123 mg/dL — AB (ref 70–99)
Potassium: 4.4 mmol/L (ref 3.5–5.1)
Sodium: 138 mmol/L (ref 135–145)

## 2018-07-29 LAB — HEPATIC FUNCTION PANEL
ALBUMIN: 3.3 g/dL — AB (ref 3.5–5.0)
ALT: 15 U/L (ref 0–44)
AST: 28 U/L (ref 15–41)
Alkaline Phosphatase: 69 U/L (ref 38–126)
BILIRUBIN DIRECT: 0.3 mg/dL — AB (ref 0.0–0.2)
BILIRUBIN TOTAL: 1 mg/dL (ref 0.3–1.2)
Indirect Bilirubin: 0.7 mg/dL (ref 0.3–0.9)
TOTAL PROTEIN: 6.3 g/dL — AB (ref 6.5–8.1)

## 2018-07-29 LAB — GLUCOSE, CAPILLARY
Glucose-Capillary: 123 mg/dL — ABNORMAL HIGH (ref 70–99)
Glucose-Capillary: 123 mg/dL — ABNORMAL HIGH (ref 70–99)

## 2018-07-29 LAB — BRAIN NATRIURETIC PEPTIDE: B NATRIURETIC PEPTIDE 5: 325 pg/mL — AB (ref 0.0–100.0)

## 2018-07-29 LAB — HEMOGLOBIN A1C
Hgb A1c MFr Bld: 6.2 % — ABNORMAL HIGH (ref 4.8–5.6)
Mean Plasma Glucose: 131.24 mg/dL

## 2018-07-29 LAB — TROPONIN I: TROPONIN I: 0.06 ng/mL — AB (ref <0.03)

## 2018-07-29 LAB — MRSA PCR SCREENING: MRSA by PCR: NEGATIVE

## 2018-07-29 MED ORDER — INSULIN ASPART 100 UNIT/ML ~~LOC~~ SOLN: 0.0000 [IU] | Freq: Every day | SUBCUTANEOUS | Status: DC

## 2018-07-29 MED ORDER — SODIUM CHLORIDE 0.9% FLUSH
3.0000 mL | Freq: Two times a day (BID) | INTRAVENOUS | Status: DC
  Administered 2018-07-29 – 2018-08-09 (×22): 3 mL via INTRAVENOUS

## 2018-07-29 MED ORDER — FUROSEMIDE 10 MG/ML IJ SOLN
60.0000 mg | Freq: Once | INTRAMUSCULAR | Status: AC
  Administered 2018-07-29: 60 mg via INTRAVENOUS
  Filled 2018-07-29: qty 8

## 2018-07-29 MED ORDER — ENOXAPARIN SODIUM 40 MG/0.4ML ~~LOC~~ SOLN
30.0000 mg | SUBCUTANEOUS | Status: DC
  Administered 2018-07-29: 30 mg via SUBCUTANEOUS
  Filled 2018-07-29: qty 0.4

## 2018-07-29 MED ORDER — ALBUTEROL SULFATE (2.5 MG/3ML) 0.083% IN NEBU
2.5000 mg | INHALATION_SOLUTION | RESPIRATORY_TRACT | Status: DC | PRN
  Administered 2018-08-01: 2.5 mg via RESPIRATORY_TRACT
  Filled 2018-07-29: qty 3

## 2018-07-29 MED ORDER — POLYETHYLENE GLYCOL 3350 17 G PO PACK: 17.0000 g | PACK | Freq: Every day | ORAL | Status: DC | PRN

## 2018-07-29 MED ORDER — ONDANSETRON HCL 4 MG/2ML IJ SOLN: 4.0000 mg | Freq: Four times a day (QID) | INTRAMUSCULAR | Status: DC | PRN

## 2018-07-29 MED ORDER — METOPROLOL TARTRATE 5 MG/5ML IV SOLN
5.0000 mg | Freq: Once | INTRAVENOUS | Status: AC
Start: 2018-07-29 — End: 2018-07-29
  Administered 2018-07-29: 5 mg via INTRAVENOUS
  Filled 2018-07-29: qty 5

## 2018-07-29 MED ORDER — ACETAMINOPHEN 325 MG PO TABS: 650.0000 mg | ORAL_TABLET | Freq: Four times a day (QID) | ORAL | Status: DC | PRN

## 2018-07-29 MED ORDER — ACETAMINOPHEN 650 MG RE SUPP: 650.0000 mg | Freq: Four times a day (QID) | RECTAL | Status: DC | PRN

## 2018-07-29 MED ORDER — ONDANSETRON HCL 4 MG PO TABS: 4.0000 mg | ORAL_TABLET | Freq: Four times a day (QID) | ORAL | Status: DC | PRN

## 2018-07-29 MED ORDER — FUROSEMIDE 10 MG/ML IJ SOLN
60.0000 mg | Freq: Two times a day (BID) | INTRAMUSCULAR | Status: DC
  Administered 2018-07-29 – 2018-07-30 (×2): 60 mg via INTRAVENOUS
  Filled 2018-07-29 (×2): qty 6

## 2018-07-29 MED ORDER — INSULIN ASPART 100 UNIT/ML ~~LOC~~ SOLN
0.0000 [IU] | Freq: Three times a day (TID) | SUBCUTANEOUS | Status: DC
  Administered 2018-07-30 – 2018-07-31 (×2): 2 [IU] via SUBCUTANEOUS
  Administered 2018-08-01: 1 [IU] via SUBCUTANEOUS
  Filled 2018-07-29 (×3): qty 1

## 2018-07-29 MED ORDER — METOPROLOL TARTRATE 50 MG PO TABS
50.0000 mg | ORAL_TABLET | Freq: Two times a day (BID) | ORAL | Status: DC
  Filled 2018-07-29 (×3): qty 1

## 2018-07-29 NOTE — ED Notes (Signed)
Pt transported to 246

## 2018-07-29 NOTE — ED Triage Notes (Signed)
[facility] from [place] for low 02 sats. EMS reports pt didn't want to come but MD told her to come. They report low oxygen readings x 2 days. CBG 206. 136/69. HR 120's. Pt was agitated for EMS, screaming for water. Pt continuously talking. Wears 2 L [place] at baseline. EMS raised oxygen to 3 L d/t 02 reading 91%. 99% upon arrival. Edema in legs, takes lasix. Facility told EMS that leg swelling is normal.

## 2018-07-29 NOTE — ED Provider Notes (Signed)
[facility] Emergency Department Provider Note    First MD Initiated Contact with Patient 07/29/18 1100     (approximate)  I have reviewed the triage vital signs and the nursing notes.   HISTORY  Chief Complaint Shortness of Breath    HPI LATANYA HEMMER is a 85 y.o. female below listed past medical history presents the ER for worsening shortness of breath leg swelling and exertional dyspnea over the past several days.  States that she also feels very weak and dehydrated.  She is on diuretic.  Was found to be hypoxic to the low 80s.  No recent fevers.  No cough.  No recent antibiotic use.  Denies any chest pain.    Past Medical History:  Diagnosis Date  . Anemia   . Anxiety   . Bronchitis   . CAD (coronary artery disease)    s/p PCI  . CKD stage 3 due to type 2 diabetes mellitus ([place])   . Colon cancer ([place])    treated, surgery, chemo, radiation  . COPD (chronic obstructive pulmonary disease) ([facility])    on 2L home o2  . Diabetes mellitus without complication ([place])   . Obesity   . Osteoarthritis   . Renal disorder    Family History  Problem Relation Age of Onset  . Diabetes Mellitus II Mother    Past Surgical History:  Procedure Laterality Date  . ABDOMINAL HYSTERECTOMY    . CATARACT EXTRACTION, BILATERAL    . PARTIAL COLECTOMY     Patient Active Problem List   Diagnosis Date Noted  . Healthcare-associated pneumonia 08/23/2016  . Hypotension 08/23/2016  . Leukocytosis 08/23/2016  . Hyponatremia 08/23/2016  . Generalized weakness 08/23/2016  . Leg swelling 08/23/2016  . Pressure ulcer 08/26/2015  . CHF (congestive heart failure) ([place]) 08/25/2015      Prior to Admission medications   Medication Sig Start Date End Date Taking? Authorizing Provider  acetaminophen (TYLENOL) 500 MG tablet Take 500 mg by mouth every 4 (four) hours as needed for mild pain, fever or headache.    [provider]  albuterol (PROVENTIL HFA;VENTOLIN HFA)  108 (90 Base) MCG/ACT inhaler Inhale 2 puffs into the lungs every 4 (four) hours as needed for wheezing or shortness of breath.    [provider]  alum & mag hydroxide-simeth (MAALOX PLUS) 400-400-40 MG/5ML suspension Take 30 mLs by mouth every 6 (six) hours as needed for indigestion.    [provider]  amoxicillin-clavulanate (AUGMENTIN) 875-125 MG tablet Take 1 tablet by mouth 2 (two) times daily. Patient not taking: Reported on 09/28/2016 08/26/16   [name], MD  aspirin 81 MG chewable tablet Chew 81 mg by mouth daily.    [provider]  buPROPion (WELLBUTRIN XL) 150 MG 24 hr tablet Take 150 mg by mouth daily.    [provider]  calcium carbonate (TUMS - DOSED IN MG ELEMENTAL CALCIUM) 500 MG chewable tablet Chew 1 tablet by mouth 2 (two) times daily.    [provider]  cetaphil (CETAPHIL) lotion Apply 1 application topically as needed for dry skin.    [provider]  Cholecalciferol (VITAMIN D) 2000 units CAPS Take 2,000 Units by mouth daily.    [provider]  clotrimazole (LOTRIMIN) 1 % cream Apply 1 application topically as needed (for rash under both breasts and left groin).    [provider]  docusate sodium (COLACE) 100 MG capsule Take 100 mg by mouth 2 (two) times daily.  [provider]  donepezil (ARICEPT) 5 MG tablet Take 5 mg by mouth daily.    [provider]  ferrous sulfate 325 (65 FE) MG tablet Take 325 mg by mouth 3 (three) times a week. Take 325 mg by mouth on Monday, Wednesday and Friday.    [provider]  Fluticasone-Salmeterol (ADVAIR) 250-50 MCG/DOSE AEPB Inhale 1 puff into the lungs 2 (two) times daily.    [provider]  furosemide (LASIX) 40 MG tablet Take 40 mg by mouth 2 (two) times daily. Take 40 mg by mouth twice daily at 6 AM and 4 PM.    [provider]  guaiFENesin (MUCINEX) 600 MG 12 hr tablet Take 600 mg by mouth 2 (two) times daily.     [provider]  guaifenesin (ROBITUSSIN) 100 MG/5ML syrup Take 200 mg by mouth every 6 (six) hours as needed for cough.    [provider]  insulin aspart (NOVOLOG) 100 UNIT/ML injection Inject 4 Units into the skin 3 (three) times daily with meals. Patient not taking: Reported on 09/28/2016 08/26/16   [name], MD  insulin glargine (LANTUS) 100 UNIT/ML injection Inject 10 Units into the skin at bedtime.    [provider]  insulin regular (NOVOLIN R,HUMULIN R) 100 units/mL injection Inject 0-14 Units into the skin 2 (two) times daily as needed for high blood sugar (Per sliding scale). Inject twice daily (in the morning before morning meal and at bedtime before Lantus) as needed per sliding scale:    0-200:  0 units  201-250:  8 units  251-300:  10 units  301-350:  12 units  351-400:  14 units  Greater than 400 or less than 60:  Call MD    [provider]  ipratropium-albuterol (DUONEB) 0.5-2.5 (3) MG/3ML SOLN Take 3 mLs by nebulization every 6 (six) hours as needed (for wheezing/shortness of breath).    [provider]  lactulose, encephalopathy, (CHRONULAC) 10 GM/15ML SOLN Take 20 g by mouth daily.    [provider]  levothyroxine (SYNTHROID, LEVOTHROID) 112 MCG tablet Take 112 mcg by mouth daily before breakfast.    [provider]  loperamide (IMODIUM) 2 MG capsule Take 2 mg by mouth as needed for diarrhea or loose stools.    [provider]  LORazepam (ATIVAN) 0.5 MG tablet Take 0.5 mg by mouth every 8 (eight) hours as needed (for agitation).    [provider]  magnesium hydroxide (MILK OF MAGNESIA) 400 MG/5ML suspension Take 30 mLs by mouth at bedtime as needed for mild constipation.    [provider]  metoprolol succinate (TOPROL-XL) 50 MG 24 hr tablet Take 1 tablet (50 mg total) by mouth daily. Take with or immediately following a meal. 08/27/16   Gouru, Aruna, MD  neomycin-bacitracin-polymyxin  (NEOSPORIN) [phone] ointment Apply 1 application topically as needed (for wound care).    [provider]  nystatin (MYCOSTATIN/NYSTOP) 100000 UNIT/GM POWD Apply 1 g topically 2 (two) times daily as needed (for dry skin).    [provider]  omeprazole (PRILOSEC) 20 MG capsule Take 20 mg by mouth daily.    [provider]  ondansetron (ZOFRAN) 4 MG tablet Take 1 tablet (4 mg total) by mouth every 6 (six) hours as needed for nausea. 08/26/16   [name], MD  oxyCODONE (OXY IR/ROXICODONE) 5 MG immediate release tablet Take 1 tablet (5 mg total) by mouth every 4 (four) hours as needed for moderate pain or breakthrough pain. 08/26/16  [name], MD  polyvinyl alcohol (LIQUIFILM TEARS) 1.4 % ophthalmic solution Place 1 drop into both eyes 2 (two) times daily.    [provider]  potassium chloride SA (K-DUR,KLOR-CON) 20 MEQ tablet Take 20 mEq by mouth daily.    [provider]  saccharomyces boulardii (FLORASTOR) 250 MG capsule Take 1 capsule (250 mg total) by mouth 2 (two) times daily. 08/26/16   [name], MD  sertraline (ZOLOFT) 50 MG tablet Take 50 mg by mouth daily. Take 50 mg by mouth daily at 8 PM.    [provider]  simethicone (MYLICON) 80 MG chewable tablet Chew 80 mg by mouth every 8 (eight) hours.    [provider]  Skin Protectants, Misc. (BAZA PROTECT EX) Apply 1 application topically 2 (two) times daily as needed (for redness or open wounds).    [provider]  tiotropium (SPIRIVA) 18 MCG inhalation capsule Place 1 capsule (18 mcg total) into inhaler and inhale daily. 08/27/16   [name], MD    Allergies Sulfamethoxazole-trimethoprim; Aspirin; Clarithromycin; Codeine; Corn-containing products; and Penicillins    Social History Social History   Tobacco Use  . Smoking status: Former Research scientist (life sciences)  . Smokeless tobacco: Never Used  . Tobacco comment: quit 20 years ago  Substance Use Topics  . Alcohol use: No     Alcohol/week: 0.0 standard drinks  . Drug use: No    Review of Systems Patient denies headaches, rhinorrhea, blurry vision, numbness, shortness of breath, chest pain, edema, cough, abdominal pain, nausea, vomiting, diarrhea, dysuria, fevers, rashes or hallucinations unless otherwise stated above in HPI. ____________________________________________   PHYSICAL EXAM:  VITAL SIGNS: Vitals:   07/29/18 1049 07/29/18 1124  BP: 126/77   Pulse: (!) 124   Resp: 20   Temp:  97.6 F (36.4 C)  SpO2: 100%     Constitutional: Alert and oriented.  Eyes: Conjunctivae are normal.  Head: Atraumatic. Nose: No congestion/rhinnorhea. Mouth/Throat: Mucous membranes are moist.   Neck: No stridor. Painless ROM.  Cardiovascular: tachycardic regular rhythm. Grossly normal heart sounds.  Good peripheral circulation. Respiratory: mild tachypnea with use of accessory muscles.  Inspiratory crackles throughout with diminished posterior bases Gastrointestinal: Soft and nontender. No distention. No abdominal bruits. No CVA tenderness. Genitourinary:  Musculoskeletal: No lower extremity tenderness but with 2+ BLE.Marland Kitchen  No joint effusions. Neurologic:  Normal speech and language. No gross focal neurologic deficits are appreciated. No facial droop Skin:  Skin is warm, dry and intact. No rash noted. Psychiatric: Mood and affect are normal. Speech and behavior are normal.  ____________________________________________   LABS (all labs ordered are listed, but only abnormal results are displayed)  Results for orders placed or performed during the hospital encounter of 07/29/18 (from the past 24 hour(s))  Basic metabolic panel     Status: Abnormal   Collection Time: 07/29/18 10:52 AM  Result Value Ref Range   Sodium 138 135 - 145 mmol/L   Potassium 4.4 3.5 - 5.1 mmol/L   Chloride 100 98 - 111 mmol/L   CO2 29 22 - 32 mmol/L   Glucose, Bld 123 (H) 70 - 99 mg/dL   BUN 29 (H) 8 - 23 mg/dL   Creatinine, Ser  1.92 (H) 0.44 - 1.00 mg/dL   Calcium 8.7 (L) 8.9 - 10.3 mg/dL   GFR calc non Af Amer 23 (L) >60 mL/min   GFR calc Af Amer 26 (L) >60 mL/min   Anion gap 9 5 - 15  CBC  Status: Abnormal   Collection Time: 07/29/18 10:52 AM  Result Value Ref Range   WBC 8.0 4.0 - 10.5 K/uL   RBC 4.49 3.87 - 5.11 MIL/uL   Hemoglobin 10.9 (L) 12.0 - 15.0 g/dL   HCT 39.6 36.0 - 46.0 %   MCV 88.2 80.0 - 100.0 fL   MCH 24.3 (L) 26.0 - 34.0 pg   MCHC 27.5 (L) 30.0 - 36.0 g/dL   RDW 17.7 (H) 11.5 - 15.5 %   Platelets 242 150 - 400 K/uL   nRBC 0.9 (H) 0.0 - 0.2 %  Troponin I - ONCE - STAT     Status: Abnormal   Collection Time: 07/29/18 10:52 AM  Result Value Ref Range   Troponin I 0.06 (HH) <0.03 ng/mL  Hepatic function panel     Status: Abnormal   Collection Time: 07/29/18 10:57 AM  Result Value Ref Range   Total Protein 6.3 (L) 6.5 - 8.1 g/dL   Albumin 3.3 (L) 3.5 - 5.0 g/dL   AST 28 15 - 41 U/L   ALT 15 0 - 44 U/L   Alkaline Phosphatase 69 38 - 126 U/L   Total Bilirubin 1.0 0.3 - 1.2 mg/dL   Bilirubin, Direct 0.3 (H) 0.0 - 0.2 mg/dL   Indirect Bilirubin 0.7 0.3 - 0.9 mg/dL  Brain natriuretic peptide     Status: Abnormal   Collection Time: 07/29/18 10:57 AM  Result Value Ref Range   B Natriuretic Peptide 325.0 (H) 0.0 - 100.0 pg/mL   ____________________________________________  EKG My review and personal interpretation at Time: 10:54   Indication: sob  Rate: 120  Rhythm: aflutter Axis: normal Other:  ____________________________________________  RADIOLOGY  I personally reviewed all radiographic images ordered to evaluate for the above acute complaints and reviewed radiology reports and findings.  These findings were personally discussed with the patient.  Please see medical record for radiology report.  ____________________________________________   PROCEDURES  Procedure(s) performed:  .Critical Care Performed by: Merlyn Lot, MD Authorized by: Merlyn Lot, MD    Critical care provider statement:    Critical care time (minutes):  35   Critical care time was exclusive of:  Separately billable procedures and treating other patients   Critical care was necessary to treat or prevent imminent or life-threatening deterioration of the following conditions:  Respiratory failure   Critical care was time spent personally by me on the following activities:  Development of treatment plan with patient or surrogate, discussions with consultants, evaluation of patient's response to treatment, examination of patient, obtaining history from patient or surrogate, ordering and performing treatments and interventions, ordering and review of laboratory studies, ordering and review of radiographic studies, pulse oximetry, re-evaluation of patient's condition and review of old charts      Critical Care performed: yes ____________________________________________   INITIAL IMPRESSION / [place] / ED COURSE  Pertinent labs & imaging results that were available during my care of the patient were reviewed by me and considered in my medical decision making (see chart for details).   DDX: Asthma, copd, CHF, pna, ptx, malignancy, Pe, anemia   RASHANA ANDREW is a 85 y.o. who presents to the ED with symptoms as described above and evidence of acute mild respiratory distress with hypoxia.  Exam certainly concerning for volume overload and CHF exacerbation.  She is afebrile.  She is mildly tachycardic but does show evidence of probable a flutter.  Will give dose of Lopressor evaluate for response that she is  on that medication chronically.  Blood will be sent for the above differential.  Will likely require IV Lasix.  The patient will be placed on continuous pulse oximetry and telemetry for monitoring.  Laboratory evaluation will be sent to evaluate for the above complaints.         As part of my medical decision making, I reviewed the following data within the  [facility] notes reviewed and incorporated, Labs reviewed, notes from prior ED visits.   ____________________________________________   FINAL CLINICAL IMPRESSION(S) / ED DIAGNOSES  Final diagnoses:  Acute on chronic respiratory failure with hypoxia ([facility]CC)  Acute on chronic congestive heart failure, unspecified heart failure type ([place])      NEW MEDICATIONS STARTED DURING THIS VISIT:  New Prescriptions   No medications on file     Note:  This document was prepared using Dragon voice recognition software and may include unintentional dictation errors.    Merlyn Lot, MD 07/29/18 1209

## 2018-07-29 NOTE — Progress Notes (Signed)
Advance care planning  Purpose of Encounter Congestive heart failure and atrial flutter  Parties in Attendance Patient and daughter at bedside  Patients Decisional capacity Alert and oriented.  Able to make medical decisions.  Her healthcare power of attorney is her daughter at bedside  Discussed regarding congestive heart failure, atrial flutter, admission, treatment plan and prognosis.  CODE STATUS discussed and patient wishes to be full code.  Daughter agrees.  Full code orders entered  Time spent - 17 minutes

## 2018-07-29 NOTE — H&P (Addendum)
[facility] [place] NAME: Tina [name]    MR#:  [id number]  DATE OF BIRTH:  1928/01/21  DATE OF ADMISSION:  07/29/2018  PRIMARY CARE PHYSICIAN: Patient, No Pcp Per   REQUESTING/REFERRING PHYSICIAN: Dr. Quentin Cornwall  CHIEF COMPLAINT:   Chief Complaint  Patient presents with  . Shortness of Breath    HISTORY OF PRESENT ILLNESS:  Tina [name]  is a 85 y.o. female with a known history of diabetes mellitus, hypertension, CKD stage III, colon cancer, CAD, COPD, chronic respiratory failure, CHF, obesity who is bedbound at [facility] presents to the emergency room sent in due to worsening shortness of breath.  Patient has had chronic shortness of breath but was noticed to be more short of breath at [facility] and sent to the ED.  Here chest x-ray shows pulmonary edema.  Needing oxygen.  Patient is poor historian.  Daughter at bedside.  Old records reviewed.  No cough or chest pain.  No recent change in medications.  Patient does not wear oxygen as advised. Also noticed to have tachycardia with atrial flutter. Mild cognitive impairment  PAST MEDICAL HISTORY:   Past Medical History:  Diagnosis Date  . Anemia   . Anxiety   . Bronchitis   . CAD (coronary artery disease)    s/p PCI  . CKD stage 3 due to type 2 diabetes mellitus ([place])   . Colon cancer ([place])    treated, surgery, chemo, radiation  . COPD (chronic obstructive pulmonary disease) ([facility])    on 2L home o2  . Diabetes mellitus without complication ([place])   . Obesity   . Osteoarthritis   . Renal disorder     PAST SURGICAL HISTORY:   Past Surgical History:  Procedure Laterality Date  . ABDOMINAL HYSTERECTOMY    . CATARACT EXTRACTION, BILATERAL    . PARTIAL COLECTOMY      SOCIAL HISTORY:   Social History   Tobacco Use  . Smoking status: Former Research scientist (life sciences)  . Smokeless tobacco: Never Used  . Tobacco comment: quit 20 years ago  Substance Use Topics  . Alcohol use: No     Alcohol/week: 0.0 standard drinks    FAMILY HISTORY:   Family History  Problem Relation Age of Onset  . Diabetes Mellitus II Mother     DRUG ALLERGIES:   Allergies  Allergen Reactions  . Sulfamethoxazole-Trimethoprim Other (See Comments)    Uncoded Allergy. Allergen: iv contrast dye, Other Reaction: Brkthru reaction Hives/itching  . Aspirin Other (See Comments)  . Clarithromycin Other (See Comments)  . Codeine Other (See Comments)    Other Reaction: Not Assessed  . Corn-Containing Products Other (See Comments)    Reaction:  Unknown   . Penicillins Other (See Comments)    REVIEW OF SYSTEMS:   Review of Systems  Constitutional: Positive for malaise/fatigue. Negative for chills and fever.  HENT: Negative for sore throat.   Eyes: Negative for blurred vision, double vision and pain.  Respiratory: Positive for cough and shortness of breath. Negative for hemoptysis and wheezing.   Cardiovascular: Negative for chest pain, palpitations, orthopnea and leg swelling.  Gastrointestinal: Negative for abdominal pain, constipation, diarrhea, heartburn, nausea and vomiting.  Genitourinary: Negative for dysuria and hematuria.  Musculoskeletal: Negative for back pain and joint pain.  Skin: Negative for rash.  Neurological: Negative for sensory change, speech change, focal weakness and headaches.  Endo/Heme/Allergies: Does not bruise/bleed easily.  Psychiatric/Behavioral: Positive for memory loss. Negative for depression. The patient  is not nervous/anxious.     MEDICATIONS AT HOME:   Prior to Admission medications   Medication Sig Start Date End Date Taking? Authorizing Provider  acetaminophen (TYLENOL) 500 MG tablet Take 500 mg by mouth every 4 (four) hours as needed for mild pain, fever or headache.    [provider]  albuterol (PROVENTIL HFA;VENTOLIN HFA) 108 (90 Base) MCG/ACT inhaler Inhale 2 puffs into the lungs every 4 (four) hours as needed for wheezing or shortness of  breath.    [provider]  alum & mag hydroxide-simeth (MAALOX PLUS) 400-400-40 MG/5ML suspension Take 30 mLs by mouth every 6 (six) hours as needed for indigestion.    [provider]  amoxicillin-clavulanate (AUGMENTIN) 875-125 MG tablet Take 1 tablet by mouth 2 (two) times daily. Patient not taking: Reported on 09/28/2016 08/26/16   [name], MD  aspirin 81 MG chewable tablet Chew 81 mg by mouth daily.    [provider]  buPROPion (WELLBUTRIN XL) 150 MG 24 hr tablet Take 150 mg by mouth daily.    [provider]  calcium carbonate (TUMS - DOSED IN MG ELEMENTAL CALCIUM) 500 MG chewable tablet Chew 1 tablet by mouth 2 (two) times daily.    [provider]  cetaphil (CETAPHIL) lotion Apply 1 application topically as needed for dry skin.    [provider]  Cholecalciferol (VITAMIN D) 2000 units CAPS Take 2,000 Units by mouth daily.    [provider]  clotrimazole (LOTRIMIN) 1 % cream Apply 1 application topically as needed (for rash under both breasts and left groin).    [provider]  docusate sodium (COLACE) 100 MG capsule Take 100 mg by mouth 2 (two) times daily.    [provider]  donepezil (ARICEPT) 5 MG tablet Take 5 mg by mouth daily.    [provider]  ferrous sulfate 325 (65 FE) MG tablet Take 325 mg by mouth 3 (three) times a week. Take 325 mg by mouth on Monday, Wednesday and Friday.    [provider]  Fluticasone-Salmeterol (ADVAIR) 250-50 MCG/DOSE AEPB Inhale 1 puff into the lungs 2 (two) times daily.    [provider]  furosemide (LASIX) 40 MG tablet Take 40 mg by mouth 2 (two) times daily. Take 40 mg by mouth twice daily at 6 AM and 4 PM.    [provider]  guaiFENesin (MUCINEX) 600 MG 12 hr tablet Take 600 mg by mouth 2 (two) times daily.    [provider]  guaifenesin (ROBITUSSIN) 100 MG/5ML syrup Take 200 mg by mouth every 6 (six) hours as needed  for cough.    [provider]  insulin aspart (NOVOLOG) 100 UNIT/ML injection Inject 4 Units into the skin 3 (three) times daily with meals. Patient not taking: Reported on 09/28/2016 08/26/16   [name], MD  insulin glargine (LANTUS) 100 UNIT/ML injection Inject 10 Units into the skin at bedtime.    [provider]  insulin regular (NOVOLIN R,HUMULIN R) 100 units/mL injection Inject 0-14 Units into the skin 2 (two) times daily as needed for high blood sugar (Per sliding scale). Inject twice daily (in the morning before morning meal and at bedtime before Lantus) as needed per sliding scale:    0-200:  0 units  201-250:  8 units  251-300:  10 units  301-350:  12 units  351-400:  14 units  Greater than 400 or less than 60:  Call MD    [provider]  ipratropium-albuterol (DUONEB) 0.5-2.5 (3) MG/3ML SOLN Take 3 mLs by nebulization every 6 (six) hours as needed (for wheezing/shortness of breath).    [provider]  lactulose, encephalopathy, (CHRONULAC) 10 GM/15ML SOLN Take 20 g by mouth daily.    [provider]  levothyroxine (SYNTHROID, LEVOTHROID) 112 MCG tablet Take 112 mcg by mouth daily before breakfast.    [provider]  loperamide (IMODIUM) 2 MG capsule Take 2 mg by mouth as needed for diarrhea or loose stools.    [provider]  LORazepam (ATIVAN) 0.5 MG tablet Take 0.5 mg by mouth every 8 (eight) hours as needed (for agitation).    [provider]  magnesium hydroxide (MILK OF MAGNESIA) 400 MG/5ML suspension Take 30 mLs by mouth at bedtime as needed for mild constipation.    [provider]  metoprolol succinate (TOPROL-XL) 50 MG 24 hr tablet Take 1 tablet (50 mg total) by mouth daily. Take with or immediately following a meal. 08/27/16   Gouru, Aruna, MD  neomycin-bacitracin-polymyxin (NEOSPORIN) [phone] ointment Apply 1 application topically as needed (for wound care).    [provider]   nystatin (MYCOSTATIN/NYSTOP) 100000 UNIT/GM POWD Apply 1 g topically 2 (two) times daily as needed (for dry skin).    [provider]  omeprazole (PRILOSEC) 20 MG capsule Take 20 mg by mouth daily.    [provider]  ondansetron (ZOFRAN) 4 MG tablet Take 1 tablet (4 mg total) by mouth every 6 (six) hours as needed for nausea. 08/26/16   [name], MD  oxyCODONE (OXY IR/ROXICODONE) 5 MG immediate release tablet Take 1 tablet (5 mg total) by mouth every 4 (four) hours as needed for moderate pain or breakthrough pain. 08/26/16   [name], MD  polyvinyl alcohol (LIQUIFILM TEARS) 1.4 % ophthalmic solution Place 1 drop into both eyes 2 (two) times daily.    [provider]  potassium chloride SA (K-DUR,KLOR-CON) 20 MEQ tablet Take 20 mEq by mouth daily.    [provider]  saccharomyces boulardii (FLORASTOR) 250 MG capsule Take 1 capsule (250 mg total) by mouth 2 (two) times daily. 08/26/16   [name], MD  sertraline (ZOLOFT) 50 MG tablet Take 50 mg by mouth daily. Take 50 mg by mouth daily at 8 PM.    [provider]  simethicone (MYLICON) 80 MG chewable tablet Chew 80 mg by mouth every 8 (eight) hours.    [provider]  Skin Protectants, Misc. (BAZA PROTECT EX) Apply 1 application topically 2 (two) times daily as needed (for redness or open wounds).    [provider]  tiotropium (SPIRIVA) 18 MCG inhalation capsule Place 1 capsule (18 mcg total) into inhaler and inhale daily. 08/27/16   [name], MD     VITAL SIGNS:  Blood pressure 126/77, pulse (!) 124, temperature 97.6 F (36.4 C), temperature source Oral, resp. rate 20, height 5\' 7"  (1.702 m), weight 124.6 kg, SpO2 100 %.  PHYSICAL EXAMINATION:  Physical Exam  GENERAL:  85 y.o.-year-old patient lying in the bed , obese EYES: Pupils equal, round, reactive to light and accommodation. No scleral icterus. Extraocular muscles intact.  HEENT: Head atraumatic, normocephalic.  Oropharynx and nasopharynx clear. No oropharyngeal erythema, moist oral mucosa  NECK:  Supple, no jugular venous distention. No thyroid enlargement, no tenderness.  LUNGS: Decreased air entry bilaterally CARDIOVASCULAR: S1, S2 normal. No murmurs, rubs, or gallops.  ABDOMEN: Soft, nontender, nondistended. Bowel sounds present. No organomegaly or mass.  EXTREMITIES:  No cyanosis, or clubbing. + 2 pedal & radial pulses b/l.  Bilateral lower extremity edema NEUROLOGIC: Cranial nerves II through XII are intact. No focal Motor or sensory deficits appreciated b/l. PSYCHIATRIC: The patient is alert and oriented x 3. Good affect.  SKIN: No obvious rash, lesion, or ulcer.   LABORATORY PANEL:   CBC Recent Labs  Lab 07/29/18 1052  WBC 8.0  HGB 10.9*  HCT 39.6  PLT 242   ------------------------------------------------------------------------------------------------------------------  Chemistries  Recent Labs  Lab 07/29/18 1052 07/29/18 1057  NA 138  --   K 4.4  --   CL 100  --   CO2 29  --   GLUCOSE 123*  --   BUN 29*  --   CREATININE 1.92*  --   CALCIUM 8.7*  --   AST  --  28  ALT  --  15  ALKPHOS  --  69  BILITOT  --  1.0   ------------------------------------------------------------------------------------------------------------------  Cardiac Enzymes Recent Labs  Lab 07/29/18 1052  TROPONINI 0.06*   ------------------------------------------------------------------------------------------------------------------  RADIOLOGY:  Dg Chest 2 View  Result Date: 07/29/2018 CLINICAL DATA:  Hypoxia. Shortness of breath. EXAM: CHEST - 2 VIEW COMPARISON:  08/23/2016 FINDINGS: Moderate enlargement of the cardiopericardial silhouette with mild interstitial accentuation and indistinct pulmonary vasculature. Atherosclerotic calcification of the aortic arch. Previous focal airspace opacity in the lingula has resolved. Clips project over the lower neck. Degenerative glenohumeral  arthropathy on the right. Lateral projection appears adversely affected by signal to noise ratio. Hazy retrocardiac appearance on the lateral projection could be due to the soft tissues of the patient's chest wall, less likely from a left lower lobe airspace opacity. IMPRESSION: 1. Cardiomegaly with interstitial pulmonary edema. 2. Questionable confluent opacity in the left lower lobe. 3.  Aortic Atherosclerosis (ICD10-I70.0). Electronically Signed   By: Van Clines M.D.   On: 07/29/2018 11:23   IMPRESSION AND PLAN:   *Acute on chronic diastolic congestive heart failure.  Will admit to telemetry unit start IV Lasix.  Metoprolol.  Monitor input and output.  Repeat BMP in the morning.  Closely monitor renal function due to underlying CKD. Check echocardiogram.  *Atrial flutter.  New onset.  Will start metoprolol. Echocardiogram ordered.  Will check TSH.  Cardiology consultation.  Sent message to Crissie Sickles, MD. anticoagulation per cardiology recommendations  *CKD stage III.  Monitor while being diuresed.  *Diabetes mellitus.  Sliding scale insulin.  *Chronic respiratory failure is stable  DVT prophylaxis.  Add Lovenox  All the records are reviewed and case discussed with ED provider. Management plans discussed with the patient, family and they are in agreement.  CODE STATUS: FULL CODE  TOTAL TIME TAKING CARE OF THIS PATIENT: 40 minutes.   Leia Alf Xxavier Noon M.D on 07/29/2018 at 1:00 PM  Between 7am to 6pm - Pager - [phone]  After 6pm go to [email or web address] - password [email or web address] [facility]  Office  [phone]  CC: Primary care physician; Patient, No Pcp Per  Note: This dictation was prepared with Dragon dictation along with smaller phrase technology. Any transcriptional errors that result from this process are unintentional.

## 2018-07-30 ENCOUNTER — Encounter: Payer: Self-pay | Admitting: Nurse Practitioner

## 2018-07-30 ENCOUNTER — Inpatient Hospital Stay (HOSPITAL_COMMUNITY)
Admit: 2018-07-30 | Discharge: 2018-07-30 | Disposition: A | Discharge status: Home / Self Care | Payer: Medicare Other | Attending: Internal Medicine | Admitting: Internal Medicine

## 2018-07-30 DIAGNOSIS — R7989 Other specified abnormal findings of blood chemistry: Secondary | ICD-10-CM

## 2018-07-30 DIAGNOSIS — I484 Atypical atrial flutter: Secondary | ICD-10-CM

## 2018-07-30 DIAGNOSIS — R0602 Shortness of breath: Secondary | ICD-10-CM

## 2018-07-30 DIAGNOSIS — I5021 Acute systolic (congestive) heart failure: Secondary | ICD-10-CM

## 2018-07-30 DIAGNOSIS — I2510 Atherosclerotic heart disease of native coronary artery without angina pectoris: Secondary | ICD-10-CM

## 2018-07-30 LAB — CBC
HCT: 37.7 % (ref 36.0–46.0)
Hemoglobin: 10.3 g/dL — ABNORMAL LOW (ref 12.0–15.0)
MCH: 24.3 pg — ABNORMAL LOW (ref 26.0–34.0)
MCHC: 27.3 g/dL — ABNORMAL LOW (ref 30.0–36.0)
MCV: 88.9 fL (ref 80.0–100.0)
PLATELETS: 239 10*3/uL (ref 150–400)
RBC: 4.24 MIL/uL (ref 3.87–5.11)
RDW: 17.7 % — ABNORMAL HIGH (ref 11.5–15.5)
WBC: 9.5 10*3/uL (ref 4.0–10.5)
nRBC: 0.3 % — ABNORMAL HIGH (ref 0.0–0.2)

## 2018-07-30 LAB — BASIC METABOLIC PANEL
Anion gap: 8 (ref 5–15)
BUN: 28 mg/dL — ABNORMAL HIGH (ref 8–23)
CO2: 31 mmol/L (ref 22–32)
Calcium: 8.3 mg/dL — ABNORMAL LOW (ref 8.9–10.3)
Chloride: 100 mmol/L (ref 98–111)
Creatinine, Ser: 1.84 mg/dL — ABNORMAL HIGH (ref 0.44–1.00)
GFR calc Af Amer: 28 mL/min — ABNORMAL LOW (ref >60)
GFR calc non Af Amer: 24 mL/min — ABNORMAL LOW (ref >60)
GLUCOSE: 97 mg/dL (ref 70–99)
Potassium: 3.4 mmol/L — ABNORMAL LOW (ref 3.5–5.1)
Sodium: 139 mmol/L (ref 135–145)

## 2018-07-30 LAB — GLUCOSE, CAPILLARY
Glucose-Capillary: 94 mg/dL (ref 70–99)
Glucose-Capillary: 106 mg/dL — ABNORMAL HIGH (ref 70–99)
Glucose-Capillary: 159 mg/dL — ABNORMAL HIGH (ref 70–99)
Glucose-Capillary: 98 mg/dL (ref 70–99)

## 2018-07-30 LAB — ECHOCARDIOGRAM COMPLETE
Height: 67 in
Weight: 4190.4 oz

## 2018-07-30 MED ORDER — ATORVASTATIN CALCIUM 20 MG PO TABS
40.0000 mg | ORAL_TABLET | Freq: Every day | ORAL | Status: DC
  Administered 2018-07-30 – 2018-08-04 (×6): 40 mg via ORAL
  Filled 2018-07-30 (×6): qty 2

## 2018-07-30 MED ORDER — POTASSIUM CHLORIDE CRYS ER 20 MEQ PO TBCR
30.0000 meq | EXTENDED_RELEASE_TABLET | Freq: Every day | ORAL | Status: DC
  Administered 2018-07-31 – 2018-08-07 (×8): 30 meq via ORAL
  Filled 2018-07-30 (×8): qty 1

## 2018-07-30 MED ORDER — FUROSEMIDE 10 MG/ML IJ SOLN
40.0000 mg | Freq: Two times a day (BID) | INTRAMUSCULAR | Status: DC
  Administered 2018-07-30 – 2018-08-01 (×2): 40 mg via INTRAVENOUS
  Filled 2018-07-30 (×2): qty 4

## 2018-07-30 MED ORDER — APIXABAN 2.5 MG PO TABS
2.5000 mg | ORAL_TABLET | Freq: Two times a day (BID) | ORAL | Status: DC
  Administered 2018-07-30 – 2018-08-07 (×18): 2.5 mg via ORAL
  Filled 2018-07-30 (×18): qty 1

## 2018-07-30 NOTE — Progress Notes (Signed)
[facility] [place] NAME: Tina [name]    MR#:  [id number]  DATE OF BIRTH:  1928/07/05  SUBJECTIVE:  CHIEF COMPLAINT:   Chief Complaint  Patient presents with  . Shortness of Breath   Better shortness of breath and cough.  On oxygen 4 L by nasal cannula.  She has home oxygen 2 L. REVIEW OF SYSTEMS:  Review of Systems  Constitutional: Positive for malaise/fatigue. Negative for chills and fever.  HENT: Negative for sore throat.   Eyes: Negative for blurred vision and double vision.  Respiratory: Positive for cough, sputum production and shortness of breath. Negative for hemoptysis, wheezing and stridor.   Cardiovascular: Negative for chest pain, palpitations, orthopnea and leg swelling.  Gastrointestinal: Negative for abdominal pain, blood in stool, diarrhea, melena, nausea and vomiting.  Genitourinary: Negative for dysuria, flank pain and hematuria.  Musculoskeletal: Negative for back pain and joint pain.  Skin: Negative for rash.  Neurological: Negative for dizziness, sensory change, focal weakness, seizures, loss of consciousness, weakness and headaches.  Endo/Heme/Allergies: Negative for polydipsia.  Psychiatric/Behavioral: Negative for depression. The patient is not nervous/anxious.     DRUG ALLERGIES:   Allergies  Allergen Reactions  . Sulfamethoxazole-Trimethoprim Other (See Comments)    Uncoded Allergy. Allergen: iv contrast dye, Other Reaction: Brkthru reaction Hives/itching  . Aspirin Other (See Comments)  . Clarithromycin Other (See Comments)  . Codeine Other (See Comments)    Other Reaction: Not Assessed  . Corn-Containing Products Other (See Comments)    Reaction:  Unknown   . Penicillins Other (See Comments)   VITALS:  Blood pressure 106/81, pulse 76, temperature 98.2 F (36.8 C), temperature source Oral, resp. rate 20, height 5\' 7"  (1.702 m), weight 118.8 kg, SpO2 98 %. PHYSICAL EXAMINATION:  Physical  Exam Constitutional:      General: She is not in acute distress.    Appearance: She is obese.     Comments: Morbid obesity.  HENT:     Head: Normocephalic.     Mouth/Throat:     Mouth: Mucous membranes are moist.  Eyes:     General: No scleral icterus.    Conjunctiva/sclera: Conjunctivae normal.     Pupils: Pupils are equal, round, and reactive to light.  Neck:     Musculoskeletal: Normal range of motion and neck supple.     Vascular: No JVD.     Trachea: No tracheal deviation.  Cardiovascular:     Rate and Rhythm: Regular rhythm. Tachycardia present.     Heart sounds: Normal heart sounds. No murmur. No gallop.   Pulmonary:     Effort: Pulmonary effort is normal. No respiratory distress.     Breath sounds: No stridor. Rales present. No wheezing or rhonchi.  Abdominal:     General: Bowel sounds are normal. There is no distension.     Palpations: Abdomen is soft.     Tenderness: There is no abdominal tenderness. There is no rebound.  Musculoskeletal: Normal range of motion.        General: No tenderness.     Right lower leg: Edema present.     Left lower leg: Edema present.  Skin:    Findings: No erythema or rash.  Neurological:     General: No focal deficit present.     Mental Status: She is alert and oriented to person, place, and time.     Cranial Nerves: No cranial nerve deficit.  Psychiatric:  Mood and Affect: Mood normal.    LABORATORY PANEL:  Female CBC Recent Labs  Lab 07/30/18 0653  WBC 9.5  HGB 10.3*  HCT 37.7  PLT 239   ------------------------------------------------------------------------------------------------------------------ Chemistries  Recent Labs  Lab 07/29/18 1057 07/30/18 0653  NA  --  139  K  --  3.4*  CL  --  100  CO2  --  31  GLUCOSE  --  97  BUN  --  28*  CREATININE  --  1.84*  CALCIUM  --  8.3*  AST 28  --   ALT 15  --   ALKPHOS 69  --   BILITOT 1.0  --    RADIOLOGY:  No results found. ASSESSMENT AND PLAN:    *Acute on chronic respiratory failure due to acute systolic congestive heart failure.   Continue oxygen by nasal cannula, nebulizer. continue IV Lasix.  Echocardiogram: LV EF: 30% -   35%.   No ACE inhibitor or ARB is due to kidney function.  *Atypical atrial flutter/ A fib.  New onset.  Started Eliquis 2.5 mg twice daily and metoprolol, consider cardioversion 3 to 4 weeks after anticoagulation per Dr. Fletcher Anon.  CAD, possible underlyin ischemic cardiomyopathy.   Continue Eliquis and Lipitor. She is not a good candidate for further cardiac evaluation and continue medical treatment per Dr. Fletcher Anon.  *CKD stage III.    Follow-up BMP.  *Diabetes mellitus.  Sliding scale insulin.  *COPD, stable, continue  Oxygen.  Morbid obesity.  Advised diet control and follow-up PCP.  Hypokalemia.  Potassium supplement.  Anemia of chronic disease.  Stable.  Discussed with cardiology nurse practitioner Mr. Sharolyn Douglas. All the records are reviewed and case discussed with Care Management/Social Worker. Management plans discussed with the patient, family and they are in agreement.  CODE STATUS: Full Code  TOTAL TIME TAKING CARE OF THIS PATIENT: 33 minutes.   More than 50% of the time was spent in counseling/coordination of care: YES  POSSIBLE D/C IN 2-3 DAYS, DEPENDING ON CLINICAL CONDITION.   Demetrios Loll M.D on 07/30/2018 at 3:51 PM  Between 7am to 6pm - Pager - [phone]  After 6pm go to [email or web address] - [facility]

## 2018-07-30 NOTE — Plan of Care (Signed)
  Problem: Education: Goal: Ability to demonstrate management of disease process will improve Outcome: Not Progressing   Problem: Elimination: Goal: Will not experience complications related to bowel motility Outcome: Progressing

## 2018-07-30 NOTE — Consult Note (Signed)
Cardiology Consult    Patient ID: Tina [name] MRN: [id number], DOB/AGE: 01-Apr-1928   Admit date: 07/29/2018 Date of Consult: 07/30/2018  Primary Physician: Patient, No Pcp Per Primary Cardiologist: Kathlyn Sacramento, MD - *New* Requesting Provider: Q. Bridgett Larsson, MD  Patient Profile    Tina [name] is a 85 y.o. female with a history of HFpEF, CAD/MI, DMII, COPD, CKD III, dementia, colon cancer, anemia, and obesity, who is being seen today for the evaluation of afib, CHF, and elevated troponin at the request of Dr. Bridgett Larsson.  Past Medical History   Past Medical History:  Diagnosis Date  . (HFpEF) heart failure with preserved ejection fraction ([place])    a. 08/2015 Echo: EF 60%. Nl right sided pressures.  . Anemia   . Anxiety   . Bronchitis   . CAD (coronary artery disease)    a. Reports 2 MI's in her 105's - details unknown; b. s/p prior PCI (no details/timing); b. 10/2006 MV: No sichemia/infarct. EF 77%; c. 2018 MV: reportedly nl per pt.  . CKD stage 3 due to type 2 diabetes mellitus ([place])   . Colon cancer ([place])    treated, surgery, chemo, radiation  . COPD (chronic obstructive pulmonary disease) (H[facility])    on 2L home o2  . Diabetes mellitus without complication ([place])   . Obesity   . Osteoarthritis     Past Surgical History:  Procedure Laterality Date  . ABDOMINAL HYSTERECTOMY    . CATARACT EXTRACTION, BILATERAL    . PARTIAL COLECTOMY       Allergies  Allergies  Allergen Reactions  . Sulfamethoxazole-Trimethoprim Other (See Comments)    Uncoded Allergy. Allergen: iv contrast dye, Other Reaction: Brkthru reaction Hives/itching  . Aspirin Other (See Comments)  . Clarithromycin Other (See Comments)  . Codeine Other (See Comments)    Other Reaction: Not Assessed  . Corn-Containing Products Other (See Comments)    Reaction:  Unknown   . Penicillins Other (See Comments)    History of Present Illness    85 y/o ? with the above PMH including a reported h/o CAD (reports 2 MI"s in her  62's), HFpEF, DMII, CKD III, COPD, dementia, colon cancer, anemia, anxiety, and obesity.  Per prior notes, she is s/p PCI, though pt is unable to provide any details regarding this.  She has had prior admissions for chest pain, PNA, and troponin elevations with nl MV in 2008, nl EF by echo in 08/2015, and reportedly nl outpt stress testing in 2018.  She was previously followed by Dr. Humphrey Rolls, but says she hasn't been seen in ~ 42yrs.  She now stays @ [facility], where she is more or less bed and wheelchair bound.  She says that she can only take a few steps, usually from her bed to her wheelchair.  She has some degree of chronic DOE and wears O2 around the clock in the setting of COPD.  Over the past few mos, she thinks dyspnea has worsened and has come to include orthopnea.  On 1/5, she was transported to the [facility] ED after she was noted to be hypoxic x 2 days on pulse ox @ [facility].  EMS increased baseline level of O2 to 3lpm and she was 99% upon ED arrival.  In the ED, CXR showed interstitial pulm edema and LLL opacity.  Trop was 0.06 and BNP 325.  ECG showed afib/flutter with rates in the 100's.  She was admitted and placed on IV lasix.  Thus far, she  is minus 1.45 L.  She remains in afib with rates in the low 100's to 1-teens.  She denies any recent history of palpitations, chest pain, presyncope, syncope.  Breathing has improved some with diuresis but she continues to have significant bilateral lower extremity edema.  Inpatient Medications    . enoxaparin (LOVENOX) injection  30 mg Subcutaneous Q24H  . furosemide  40 mg Intravenous BID  . insulin aspart  0-5 Units Subcutaneous QHS  . insulin aspart  0-9 Units Subcutaneous TID WC  . metoprolol tartrate  50 mg Oral BID  . sodium chloride flush  3 mL Intravenous Q12H    Family History    Family History  Problem Relation Age of Onset  . Diabetes Mellitus II Mother    She indicated that her mother is deceased. She indicated that her father  is deceased.   Social History    Social History   Socioeconomic History  . Marital status: Divorced    Spouse name: Not on file  . Number of children: Not on file  . Years of education: Not on file  . Highest education level: Not on file  Occupational History  . Not on file  Social Needs  . Financial resource strain: Not on file  . Food insecurity:    Worry: Not on file    Inability: Not on file  . Transportation needs:    Medical: Not on file    Non-medical: Not on file  Tobacco Use  . Smoking status: Former Research scientist (life sciences)  . Smokeless tobacco: Never Used  . Tobacco comment: quit nearly 40 yrs ago (age 40).  Substance and Sexual Activity  . Alcohol use: No    Alcohol/week: 0.0 standard drinks  . Drug use: No  . Sexual activity: Not on file  Lifestyle  . Physical activity:    Days per week: Not on file    Minutes per session: Not on file  . Stress: Not on file  Relationships  . Social connections:    Talks on phone: Not on file    Gets together: Not on file    Attends religious service: Not on file    Active member of club or organization: Not on file    Attends meetings of clubs or organizations: Not on file    Relationship status: Not on file  . Intimate partner violence:    Fear of current or ex partner: Not on file    Emotionally abused: Not on file    Physically abused: Not on file    Forced sexual activity: Not on file  Other Topics Concern  . Not on file  Social History Narrative   Lives at [facility] assisted living   Wheel chair bound     Review of Systems    General:  No chills, fever, night sweats or weight changes.  Cardiovascular:  No chest pain, +++ dyspnea on minimal exertion, +++ lower ext edema, +++ orthopnea, no palpitations, paroxysmal nocturnal dyspnea. Dermatological: No rash, lesions/masses Respiratory: No cough, +++ dyspnea Urologic: No hematuria, dysuria Abdominal:   No nausea, vomiting, diarrhea, bright red blood per rectum, melena,  or hematemesis Neurologic:  No visual changes, wkns, changes in mental status.  +++ bilat leg wkns - doesn't ambulate. All other systems reviewed and are otherwise negative except as noted above.  Physical Exam    Blood pressure (!) 96/54, pulse (!) 104, temperature 98.2 F (36.8 C), temperature source Oral, resp. rate 20, height 5\' 7"  (1.702 m), weight  118.8 kg, SpO2 98 %.  General: Pleasant, NAD Psych: Normal affect. Neuro: Alert and oriented X 3. Moves all extremities spontaneously. HEENT: Normal  Neck: Supple, obese, without bruits.  Unable to measure JVP 2/2 body habitus. Lungs:  Resp regular and unlabored, diminished breath sounds bilaterally. Heart: IR, IR, tachy, no s3, s4, or murmurs. Abdomen: Obeses, soft, non-tender, non-distended, BS + x 4.  Extremities: No clubbing, cyanosis.  2+ bilat LE edema extending to the posterior and lateral thighs. DP/PT/Radials 1+ and equal bilaterally. Lower ext tender to touch bilat.  Labs    Troponin  Recent Labs    07/29/18 1052  TROPONINI 0.06*   Lab Results  Component Value Date   WBC 9.5 07/30/2018   HGB 10.3 (L) 07/30/2018   HCT 37.7 07/30/2018   MCV 88.9 07/30/2018   PLT 239 07/30/2018    Recent Labs  Lab 07/29/18 1057 07/30/18 0653  NA  --  139  K  --  3.4*  CL  --  100  CO2  --  31  BUN  --  28*  CREATININE  --  1.84*  CALCIUM  --  8.3*  PROT 6.3*  --   BILITOT 1.0  --   ALKPHOS 69  --   ALT 15  --   AST 28  --   GLUCOSE  --  97    Radiology Studies    Dg Chest 2 View  Result Date: 07/29/2018 CLINICAL DATA:  Hypoxia. Shortness of breath. EXAM: CHEST - 2 VIEW COMPARISON:  08/23/2016 FINDINGS: Moderate enlargement of the cardiopericardial silhouette with mild interstitial accentuation and indistinct pulmonary vasculature. Atherosclerotic calcification of the aortic arch. Previous focal airspace opacity in the lingula has resolved. Clips project over the lower neck. Degenerative glenohumeral arthropathy on the  right. Lateral projection appears adversely affected by signal to noise ratio. Hazy retrocardiac appearance on the lateral projection could be due to the soft tissues of the patient's chest wall, less likely from a left lower lobe airspace opacity. IMPRESSION: 1. Cardiomegaly with interstitial pulmonary edema. 2. Questionable confluent opacity in the left lower lobe. 3.  Aortic Atherosclerosis (ICD10-I70.0). Electronically Signed   By: Van Clines M.D.   On: 07/29/2018 11:23    ECG & Cardiac Imaging    Afib, 122, rightward axis, incomplete rbbb, non-specific T changes  - personally reviewed.  Assessment & Plan    1.  Acute on chronic diastolic CHF:  Pt admitted 1/5 with a several month h/o progressive dyspnea, orthopnea, and lower ext edema culminating in a 2 day h/o hypoxia noted @ [facility].  In the ED, she was noted to be in rapid afib/flutter and was also markedly volume overloaded with lower ext edema and interstitial edema on cxr.  Wt on admission is up 40 kg compared to her wt 2 yrs ago.  She is minus 1.45L thus far and continues to have significant edema.  Creat 1.92 on admission  1.84 this AM.  Last available creat was 1.11 in 08/2016.  Cont IV diuresis - 40 BID.  Dosing limited by soft BPs.  Hopefully, HR will improve further with diuresis and  blocker therapy.  Echo pending this AM.  Supp K.  2.  Afib w/ RVR:  Presumably new onset.  No known prior history.  Duration unclear, though it appears HF symptoms of dyspnea, orthopnea, and lower ext edema have been coming on for several months.  She is currently on lopressor 50 BID and rates are low 100's  to 1-teens.  She denies c/p or palpitations.  She is not an ideal candidate for digoxin in the setting of CKD III-IV.  With unknown duration, would not add antiarrhythmic therapy.  CHA2DS2VASc = 6.  She has chronic anemia, though denies any history of GI bleeding or falls.  Hgb down slightly from 2018, though HCt stable.  She is more or  less bed/wheelchair bound @ [facility].  She is interested in initiating oral anticoagulation.  Eliquis 2.5mg  BID would be appropriate and I will start.  We could consider 3-4 wks of therapeutic [place] followed by outpt DCCV, though if we can achieve adequate rate control and effectively manage HF symptoms, given comorbidities, she might be best served with a rate management strategy.  3.  Elevated troponin/CAD:  Reportedly has had PCI and she reported 2 MI's in her 30's.  I can see that she was previously seen @ [facility], but no historical information is available to provide details.  Trop 0.06 in the setting of hypoxia, CHF, and rapid afib.  She denies chest pain.  Suspect demand ischemia.  Would not pursue ischemic eval @ this time, esp in light of worsened renal failure.  Body habitus makes her a poor candidate for nuclear imaging.  Echo pending.  Cont  blocker.  No ASA in setting of eliquis initiation.  Should be on statin - will add.  4.  Hypokalemia:  Supp.  F/u Mg.  5.  Normocytic anemia:  Follow with initiation of [place].  6.  CKD III-IV:  Creat up from prior known reading in 2018.  With diuresis overnight, she has come down from 1.92 on admission to 1.84.  Cont to follow with diuresis.  Cont to avoid nephrotoxic agents.  7.  DM II:  SSI per IM.  Add statin in setting of above.  8.  COPD:  Diminished breath sounds, though no wheezing.  Nebs per IM.  Signed, Murray Hodgkins, NP 07/30/2018, 11:11 AM  For questions or updates, please contact   Please consult [email or web address] for contact info under Cardiology/STEMI.

## 2018-07-30 NOTE — Progress Notes (Signed)
*  PRELIMINARY RESULTS* Echocardiogram 2D Echocardiogram has been performed.  Sherrie Sport 07/30/2018, 11:27 AM

## 2018-07-30 NOTE — Clinical Social Work Note (Signed)
Patient is from [place], [place] contacted [facility], plan is for patient to return pending PT recommendations.  CSW to complete formal assessment at a later time.  Jones Broom. [place], MSW, [place]  07/30/2018 5:17 PM

## 2018-07-31 DIAGNOSIS — I4891 Unspecified atrial fibrillation: Secondary | ICD-10-CM

## 2018-07-31 DIAGNOSIS — I5023 Acute on chronic systolic (congestive) heart failure: Secondary | ICD-10-CM

## 2018-07-31 LAB — GLUCOSE, CAPILLARY
Glucose-Capillary: 110 mg/dL — ABNORMAL HIGH (ref 70–99)
Glucose-Capillary: 107 mg/dL — ABNORMAL HIGH (ref 70–99)
Glucose-Capillary: 153 mg/dL — ABNORMAL HIGH (ref 70–99)
Glucose-Capillary: 110 mg/dL — ABNORMAL HIGH (ref 70–99)

## 2018-07-31 LAB — BASIC METABOLIC PANEL
Anion gap: 7 (ref 5–15)
BUN: 28 mg/dL — ABNORMAL HIGH (ref 8–23)
CO2: 33 mmol/L — ABNORMAL HIGH (ref 22–32)
Calcium: 8.4 mg/dL — ABNORMAL LOW (ref 8.9–10.3)
Chloride: 99 mmol/L (ref 98–111)
Creatinine, Ser: 1.77 mg/dL — ABNORMAL HIGH (ref 0.44–1.00)
GFR calc Af Amer: 29 mL/min — ABNORMAL LOW (ref >60)
GFR calc non Af Amer: 25 mL/min — ABNORMAL LOW (ref >60)
Glucose, Bld: 110 mg/dL — ABNORMAL HIGH (ref 70–99)
Potassium: 3.4 mmol/L — ABNORMAL LOW (ref 3.5–5.1)
Sodium: 139 mmol/L (ref 135–145)

## 2018-07-31 LAB — MAGNESIUM: Magnesium: 2.2 mg/dL (ref 1.7–2.4)

## 2018-07-31 LAB — TSH: TSH: 1.514 u[IU]/mL (ref 0.350–4.500)

## 2018-07-31 MED ORDER — METOPROLOL TARTRATE 25 MG PO TABS
12.5000 mg | ORAL_TABLET | Freq: Two times a day (BID) | ORAL | Status: DC
  Administered 2018-07-31 – 2018-08-01 (×3): 12.5 mg via ORAL
  Filled 2018-07-31 (×3): qty 1

## 2018-07-31 NOTE — Progress Notes (Signed)
Progress Note  Patient Name: Tina [name] Date of Encounter: 07/31/2018  Primary Cardiologist: Kathlyn Sacramento, MD   Subjective   She continues to c/o shortness of breath on nasal cannula oxygen.  She also  complains of bilateral leg pain this morning.  He denies any current chest pain, palpitations, or racing heart rate.  Inpatient Medications    Scheduled Meds: . apixaban  2.5 mg Oral BID  . atorvastatin  40 mg Oral q1800  . furosemide  40 mg Intravenous BID  . insulin aspart  0-5 Units Subcutaneous QHS  . insulin aspart  0-9 Units Subcutaneous TID WC  . metoprolol tartrate  50 mg Oral BID  . potassium chloride  30 mEq Oral Daily  . sodium chloride flush  3 mL Intravenous Q12H   Continuous Infusions:  PRN Meds: acetaminophen **OR** acetaminophen, albuterol, ondansetron **OR** ondansetron (ZOFRAN) IV, polyethylene glycol   Vital Signs    Vitals:   07/30/18 1817 07/30/18 2002 07/31/18 0431 07/31/18 0738  BP: 97/60 (!) 115/50 (!) 103/53 95/65  Pulse: (!) 106 (!) 107 (!) 103 86  Resp:  (!) 21 20 19   Temp:  97.9 F (36.6 C) 97.7 F (36.5 C) 97.6 F (36.4 C)  TempSrc:  Oral Oral Oral  SpO2:  98% 98% 98%  Weight:      Height:        Intake/Output Summary (Last 24 hours) at 07/31/2018 0920 Last data filed at 07/30/2018 2200 Gross per 24 hour  Intake 3 ml  Output 1200 ml  Net -1197 ml   Filed Weights   07/29/18 1050 07/30/18 0455  Weight: 124.6 kg 118.8 kg    Telemetry    Course atrial fibrillation/atrial flutter, ventricular rates 100 -125 bpm- Personally Reviewed  ECG    No new tracings- Personally Reviewed  Previous EKG showed atrial fibrillation, 122 ventricular rate, rightward axis, incomplete right bundle branch block with nonspecific T changes  Physical Exam   GEN: No acute distress.   Neck:  JVD difficult to assess due to body habitus Cardiac:  IRIR, tachycardic, no murmurs, rubs, or gallops.  Respiratory:  Diminished breath sounds  bilaterally GI:  Obese, soft, nontender  MS: 2+ bilateral lower extremity edema, L>R,; bilaterally extremely tender to palpation, no deformity. Neuro:  Nonfocal  Psych: Normal affect   Labs    Chemistry Recent Labs  Lab 07/29/18 1052 07/29/18 1057 07/30/18 0653 07/31/18 0702  NA 138  --  139 139  K 4.4  --  3.4* 3.4*  CL 100  --  100 99  CO2 29  --  31 33*  GLUCOSE 123*  --  97 110*  BUN 29*  --  28* 28*  CREATININE 1.92*  --  1.84* 1.77*  CALCIUM 8.7*  --  8.3* 8.4*  PROT  --  6.3*  --   --   ALBUMIN  --  3.3*  --   --   AST  --  28  --   --   ALT  --  15  --   --   ALKPHOS  --  69  --   --   BILITOT  --  1.0  --   --   GFRNONAA 23*  --  24* 25*  GFRAA 26*  --  28* 29*  ANIONGAP 9  --  8 7     Hematology Recent Labs  Lab 07/29/18 1052 07/30/18 0653  WBC 8.0 9.5  RBC 4.49 4.24  HGB 10.9*  10.3*  HCT 39.6 37.7  MCV 88.2 88.9  MCH 24.3* 24.3*  MCHC 27.5* 27.3*  RDW 17.7* 17.7*  PLT 242 239    Cardiac Enzymes Recent Labs  Lab 07/29/18 1052  TROPONINI 0.06*   No results for input(s): TROPIPOC in the last 168 hours.   BNP Recent Labs  Lab 07/29/18 1057  BNP 325.0*     DDimer No results for input(s): DDIMER in the last 168 hours.   Radiology    Dg Chest 2 View  Result Date: 07/29/2018 CLINICAL DATA:  Hypoxia. Shortness of breath. EXAM: CHEST - 2 VIEW COMPARISON:  08/23/2016 FINDINGS: Moderate enlargement of the cardiopericardial silhouette with mild interstitial accentuation and indistinct pulmonary vasculature. Atherosclerotic calcification of the aortic arch. Previous focal airspace opacity in the lingula has resolved. Clips project over the lower neck. Degenerative glenohumeral arthropathy on the right. Lateral projection appears adversely affected by signal to noise ratio. Hazy retrocardiac appearance on the lateral projection could be due to the soft tissues of the patient's chest wall, less likely from a left lower lobe airspace opacity. IMPRESSION:  1. Cardiomegaly with interstitial pulmonary edema. 2. Questionable confluent opacity in the left lower lobe. 3.  Aortic Atherosclerosis (ICD10-I70.0). Electronically Signed   By: Van Clines M.D.   On: 07/29/2018 11:23    Cardiac Studies   07/30/2018 TTE Study Conclusions - Procedure narrative: The only obtainable view was parasternal. - Left ventricle: The cavity size was mildly dilated. There was   mild concentric hypertrophy. Systolic function was moderately to   severely reduced. The estimated ejection fraction was in the   range of 30% to 35%. Severe hypokinesis of the anteroseptal and   anterior myocardium. The study is not technically sufficient to   allow evaluation of LV diastolic function. - Mitral valve: Calcified annulus. - Right ventricle: The cavity size was mildly dilated. Wall   thickness was normal. Systolic function was mildly reduced.  Patient Profile     85 y.o. female with a history of HFrEF (07/30/18 EF 30-35%), CAD/MI, DMII, COPD, CKD III, dementia, colon cancer, anemia, and obesity, who is being seen today for the evaluation of afib, CHF, and elevated troponin.  Assessment & Plan    Acute on chronic systolic heart failure -Admitted 07/29/2026 after several months history of progressive dyspnea, orthopnea, and lower extremity edema culminating in a 2-day history of hypoxia as noted at [facility].  In the ED, A. fib/flutter with RVR markedly volume overloaded / bilateral lower extremity edema / interstitial edema on chest x-ray.  Most recent TTE as above shows EF reduced in the setting of atrial fibrillation/flutter with uncontrolled ventricular rates and ruled a difficult study.  Etiology of acute systolic heart failure likely tachycardia induced given ventricular rates but cannot completely exclude ischemic heart disease given wall motion abnormalities on echo.  - Remains volume overloaded on exam with LEE TTP and reduced breath sounds. With continued LEE and  SOB, could also consider DVT workup with adjustment of anticoagulation per guidelines if necessary and per IM.  -Continue IV diuresis with 40 twice daily. Continue to monitor I's/O, daily weights.  Continue to monitor renal function / electrolytes with daily BMET.  Net -0254 for the admission. Wt 124.6kg  118.8kg.  No ACE inhibitor/sharp/Arni at this time due to reduced kidney function.  Afib with RVR -Unknown onset, presumed new. CHA2DS2VASc score of at least  6 (CHF, agex2, vascular DM2, female). Started chronic anticoagulation with Eliquis 2.5 mg twice daily. Continue. Once  therapeutically anticoagulated 4 wks, could consider cardioversion. Consider updating TSH in the setting of Afib with RVR. -Continue metoprolol & current plan for rate control only at this time. Unfortunately, beta-blocker dosages have been limited by soft blood pressure this admission. She is not an ideal candidate for digoxin in the setting of chronic kidney disease 3/4.  Continue heart rate control and titrate as blood pressure allows.  Elevated troponin / CAD -No current CP. Troponin was 0.06 been minimally elevated in the setting of the above, likely 2/2 demand ischemia in the setting of uncontrolled ventricular rates with acute on chronic HFrEF with hypoxia.  As above, cannot exclude an underlying ischemic cardiomyopathy based on wall motion abnormality noted on echo; however, she is not an ideal candidate for invasive ischemic cardiac evaluation given age, coronary disease, and poor functional capacity with baseline dementia.  Continue medical management. Started on statin with atorvastatin 40 mg daily and will need follow-up as an outpatient for labs and uptitration of her statin.   Hypokalemia -Continue to replete with goal 4.0.  Most recent potassium 3.4, magnesium 2.2.   - Daily BMET  Normocytic anemia -Continue to monitor in the setting of anticoagulation  CKDIII-IV -1/7 creatinine 1.77  - Daily BMET- continue to  monitor in the setting of ongoing diuresis.  Avoid nephroptoxins.  Renal dosage for medications  DM2 -SSI - 1/5 A1C 6.2 - Per IM  COPD -Chronic DOE, wears O2 in the setting of COPD    For questions or updates, please contact [place] Please consult [email or web address] for contact info under        Signed, Arvil Chaco, PA-C  07/31/2018, 9:20 AM

## 2018-07-31 NOTE — Clinical Social Work Note (Addendum)
Clinical Social Work Assessment  Patient Details  Name: Tina [name] MRN: [id number] Date of Birth: [birth year]  Date of referral:  07/30/18               Reason for consult:  Facility Placement                Permission sought to share information with:  Facility Sport and exercise psychologist, Family Supports Permission granted to share information::  Yes, Verbal Permission Granted  Name::     Dante Gang [phone]  or Frances Furbish Daughter   [phone] or [place] Daughter   [phone]   Agency::  ALF and SNF  Relationship::     Contact Information:     Housing/Transportation Living arrangements for the past 2 months:  [place] of Information:  Medical Team, Facility Patient Interpreter Needed:  None Criminal Activity/Legal Involvement Pertinent to Current Situation/Hospitalization:  No - Comment as needed Significant Relationships:  Adult Children, Other Family Members Lives with:  Facility Resident Do you feel safe going back to the place where you live?  Yes Need for family participation in patient care:  Yes (Comment)  Care giving concerns:  Patient's facility would like her to return back once she is ready for discharge.   Social Worker assessment / plan:  Patient is an 85 year old female who is alert and oriented x1.  Patient has dementia, CSW spoke to staff at [facility] to complete assessment.  Patient has been at ALF for over 6 years, she usually is able to walk around facility with her walker, however since she came to the hospital she has not been able to walk much on her own.  Staff report that she is usually a one person assist, and staff assist with getting patient to the wheelchair and she gets her self around ALF in wheelchair.  Patient's family do visit occasionally per ALF.  Patient does not have a legal guardian, family are HCPOA.  CSW explained role and process to facility on how to get patient back to ALF.  Patient  will have to be seen by PT and assessed by facility before she is ready to return to ALF.  CSW to share information with ALF and facilitate discharge planning.  Employment status:  Retired Forensic scientist:  Medicare PT Recommendations:  Not assessed at this time [place] / Referral to community resources:  [place]  Patient/Family's Response to care:  Plan for patient to return back to ALF.  Patient/Family's Understanding of and Emotional Response to Diagnosis, Current Treatment, and Prognosis:  Patient's family did not report any concerns with her returning to [facility]. Emotional Assessment Appearance:  Appears stated age Attitude/Demeanor/Rapport:    Affect (typically observed):  Appropriate, Stable, Calm Orientation:  Oriented to Self Alcohol / Substance use:  Not Applicable Psych involvement (Current and /or in the community):  No (Comment)  Discharge Needs  Concerns to be addressed:  Care Coordination Readmission within the last 30 days:  No Current discharge risk:  Cognitively Impaired Barriers to Discharge:  Continued Medical Work up   Anell Barr 07/31/2018, 6:58 PM

## 2018-07-31 NOTE — Progress Notes (Addendum)
[facility] NAME: Tina [name]    MR#:  [id number]  DATE OF BIRTH:  [birth year]  SUBJECTIVE:  Patient feeling better, cardiology input appreciated-to increase Lasix to 60 mg twice daily/increase beta-blocker therapy 12.5 mg every 6 hours blood pressure will tolerate, physical therapy to see  REVIEW OF SYSTEMS:  Review of Systems  Constitutional: Positive for malaise/fatigue. Negative for chills and fever.  HENT: Negative for sore throat.   Eyes: Negative for blurred vision and double vision.  Respiratory: Positive for cough and shortness of breath. Negative for hemoptysis, sputum production, wheezing and stridor.   Cardiovascular: Negative for chest pain, palpitations, orthopnea and leg swelling.  Gastrointestinal: Negative for abdominal pain, blood in stool, diarrhea, melena, nausea and vomiting.  Genitourinary: Negative for dysuria, flank pain and hematuria.  Musculoskeletal: Negative for back pain and joint pain.  Skin: Negative for rash.  Neurological: Negative for dizziness, sensory change, focal weakness, seizures, loss of consciousness, weakness and headaches.  Endo/Heme/Allergies: Negative for polydipsia.  Psychiatric/Behavioral: Negative for depression. The patient is not nervous/anxious.     DRUG ALLERGIES:   Allergies  Allergen Reactions  . Sulfamethoxazole-Trimethoprim Other (See Comments)    Uncoded Allergy. Allergen: iv contrast dye, Other Reaction: Brkthru reaction Hives/itching  . Aspirin Other (See Comments)  . Clarithromycin Other (See Comments)  . Codeine Other (See Comments)    Other Reaction: Not Assessed  . Penicillins Other (See Comments)   VITALS:  Blood pressure (!) 88/55, pulse 86, temperature 97.6 F (36.4 C), temperature source Oral, resp. rate 19, height 5\' 7"  (1.702 m), weight 118.8 kg, SpO2 98 %. PHYSICAL EXAMINATION:  Physical Exam Constitutional:      General: She is not in acute  distress.    Appearance: She is obese.     Comments: Morbid obesity.  HENT:     Head: Normocephalic.     Mouth/Throat:     Mouth: Mucous membranes are moist.  Eyes:     General: No scleral icterus.    Conjunctiva/sclera: Conjunctivae normal.     Pupils: Pupils are equal, round, and reactive to light.  Neck:     Musculoskeletal: Normal range of motion and neck supple.     Vascular: No JVD.     Trachea: No tracheal deviation.  Cardiovascular:     Rate and Rhythm: Regular rhythm. Tachycardia present.     Heart sounds: Normal heart sounds. No murmur. No gallop.   Pulmonary:     Effort: Pulmonary effort is normal. No respiratory distress.     Breath sounds: No stridor. Rales present. No wheezing or rhonchi.  Abdominal:     General: Bowel sounds are normal. There is no distension.     Palpations: Abdomen is soft.     Tenderness: There is no abdominal tenderness. There is no rebound.  Musculoskeletal: Normal range of motion.        General: No tenderness.     Right lower leg: Edema present.     Left lower leg: Edema present.  Skin:    Findings: No erythema or rash.  Neurological:     General: No focal deficit present.     Mental Status: She is alert and oriented to person, place, and time.     Cranial Nerves: No cranial nerve deficit.  Psychiatric:        Mood and Affect: Mood normal.    LABORATORY PANEL:  Female CBC Recent Labs  Lab 07/30/18 0653  WBC  9.5  HGB 10.3*  HCT 37.7  PLT 239   ------------------------------------------------------------------------------------------------------------------ Chemistries  Recent Labs  Lab 07/29/18 1057  07/31/18 0702  NA  --    < > 139  K  --    < > 3.4*  CL  --    < > 99  CO2  --    < > 33*  GLUCOSE  --    < > 110*  BUN  --    < > 28*  CREATININE  --    < > 1.77*  CALCIUM  --    < > 8.4*  MG  --   --  2.2  AST 28  --   --   ALT 15  --   --   ALKPHOS 69  --   --   BILITOT 1.0  --   --    < > = values in this  interval not displayed.   RADIOLOGY:  No results found. ASSESSMENT AND PLAN:  *Acute on chronic respiratory failure due to acute systolic congestive heart failure.   Resolving  Continue supplemental oxygen wean as tolerated   *Acute on chronic systolic congestive heart failure sedation  Echocardiogram: LV EF: 30% -   35%.   Continue congestive heart failure protocol, cardiology input appreciated-to increase Lasix to 60 mg IV twice daily, beta-blocker therapy/Lopressor 12.5 mg every 6 hours if blood pressure will tolerate, no ACE inhibitor or ARB is due to kidney function.  *Acute atrial flutter/ A fib, New onset.  Stable Continue Lopressor, Eliquis, cardiology considering cardioversion 3 to 4 weeks after anticoagulation per Dr. Fletcher Anon.  *Chronic CAD Stable Continue Eliquis and Lipitor She is not a good candidate for further cardiac evaluation and continue medical treatment per Dr. Fletcher Anon.  *CKD stage III Stable Avoid nephrotoxic agents   *Chronic diabetes mellitus, type II Continue sliding scale insulin Accu-Cheks per routine  *COPD Stable continue oxygen  *Chronic morbid obesity Likely secondary to excess calories *Modification recommended  *Acute hypokalemia Repleted  *Anemia of chronic disease Stable  *Acute hypotension  Improved Hold Lasix/antihypertensive agents if needed  All the records are reviewed and case discussed with Care Management/Social Worker. Management plans discussed with the patient, family and they are in agreement.  CODE STATUS: Full Code  TOTAL TIME TAKING CARE OF THIS PATIENT: 30 minutes.   More than 50% of the time was spent in counseling/coordination of care: YES  POSSIBLE D/C IN 1-3 DAYS, DEPENDING ON CLINICAL CONDITION.   Demetrios Loll M.D on 07/31/2018 at 2:57 PM  Between 7am to 6pm - Pager - [phone]  After 6pm go to [email or web address] - [facility]

## 2018-07-31 NOTE — Progress Notes (Signed)
Heart Failure counseling  Discussed medications (lasix, metoprolol), weighing daily, increasing exercise as possible, limiting salt intake. Gave her the heart failure handout, and informed to let us know if she had any questions in the future. Patient understood importance.   Paticia Stack, PharmD Pharmacy Resident  07/31/2018 3:22 PM

## 2018-07-31 NOTE — Progress Notes (Signed)
Low blood pressure this morning, asymptomatic. Dr. Bridgett Larsson aware. Held morning doses of lasix and metoprolol. Will continue to monitor.

## 2018-08-01 ENCOUNTER — Inpatient Hospital Stay: Payer: Medicare Other

## 2018-08-01 DIAGNOSIS — I4819 Other persistent atrial fibrillation: Secondary | ICD-10-CM

## 2018-08-01 DIAGNOSIS — N184 Chronic kidney disease, stage 4 (severe): Secondary | ICD-10-CM

## 2018-08-01 DIAGNOSIS — I248 Other forms of acute ischemic heart disease: Secondary | ICD-10-CM

## 2018-08-01 DIAGNOSIS — Z515 Encounter for palliative care: Secondary | ICD-10-CM

## 2018-08-01 DIAGNOSIS — J9621 Acute and chronic respiratory failure with hypoxia: Secondary | ICD-10-CM

## 2018-08-01 DIAGNOSIS — Z7189 Other specified counseling: Secondary | ICD-10-CM

## 2018-08-01 LAB — GLUCOSE, CAPILLARY
GLUCOSE-CAPILLARY: 138 mg/dL — AB (ref 70–99)
GLUCOSE-CAPILLARY: 118 mg/dL — AB (ref 70–99)
Glucose-Capillary: 119 mg/dL — ABNORMAL HIGH (ref 70–99)
Glucose-Capillary: 108 mg/dL — ABNORMAL HIGH (ref 70–99)

## 2018-08-01 LAB — BASIC METABOLIC PANEL
ANION GAP: 8 (ref 5–15)
BUN: 28 mg/dL — ABNORMAL HIGH (ref 8–23)
CO2: 31 mmol/L (ref 22–32)
Calcium: 8.7 mg/dL — ABNORMAL LOW (ref 8.9–10.3)
Chloride: 99 mmol/L (ref 98–111)
Creatinine, Ser: 1.75 mg/dL — ABNORMAL HIGH (ref 0.44–1.00)
GFR calc Af Amer: 30 mL/min — ABNORMAL LOW (ref >60)
GFR calc non Af Amer: 26 mL/min — ABNORMAL LOW (ref >60)
GLUCOSE: 106 mg/dL — AB (ref 70–99)
Potassium: 4 mmol/L (ref 3.5–5.1)
Sodium: 138 mmol/L (ref 135–145)

## 2018-08-01 LAB — BLOOD GAS, ARTERIAL
ACID-BASE EXCESS: 8.1 mmol/L — AB (ref 0.0–2.0)
Bicarbonate: 35.1 mmol/L — ABNORMAL HIGH (ref 20.0–28.0)
Mechanical Rate: 20
O2 Saturation: 95.4 %
Patient temperature: 37
pCO2 arterial: 58 mmHg — ABNORMAL HIGH (ref 32.0–48.0)
pH, Arterial: 7.39 (ref 7.350–7.450)
pO2, Arterial: 79 mmHg — ABNORMAL LOW (ref 83.0–108.0)

## 2018-08-01 MED ORDER — SERTRALINE HCL 50 MG PO TABS
50.0000 mg | ORAL_TABLET | Freq: Every day | ORAL | Status: DC
  Administered 2018-08-01 – 2018-08-07 (×7): 50 mg via ORAL
  Filled 2018-08-01 (×7): qty 1

## 2018-08-01 MED ORDER — METOPROLOL TARTRATE 25 MG PO TABS
12.5000 mg | ORAL_TABLET | Freq: Four times a day (QID) | ORAL | Status: DC
  Administered 2018-08-01 – 2018-08-02 (×2): 12.5 mg via ORAL
  Filled 2018-08-01 (×3): qty 1

## 2018-08-01 MED ORDER — DONEPEZIL HCL 5 MG PO TABS
5.0000 mg | ORAL_TABLET | Freq: Every day | ORAL | Status: DC
  Administered 2018-08-01 – 2018-08-07 (×7): 5 mg via ORAL
  Filled 2018-08-01 (×8): qty 1

## 2018-08-01 MED ORDER — GUAIFENESIN 100 MG/5ML PO SYRP: 200.0000 mg | ORAL_SOLUTION | Freq: Four times a day (QID) | ORAL | Status: DC | PRN

## 2018-08-01 MED ORDER — LACTULOSE 10 GM/15ML PO SOLN: 20.0000 g | Freq: Every day | ORAL | Status: DC | PRN

## 2018-08-01 MED ORDER — IPRATROPIUM-ALBUTEROL 0.5-2.5 (3) MG/3ML IN SOLN
3.0000 mL | Freq: Four times a day (QID) | RESPIRATORY_TRACT | Status: DC | PRN
  Administered 2018-08-01: 3 mL via RESPIRATORY_TRACT
  Filled 2018-08-01: qty 3

## 2018-08-01 MED ORDER — DOCUSATE SODIUM 100 MG PO CAPS
100.0000 mg | ORAL_CAPSULE | Freq: Two times a day (BID) | ORAL | Status: DC
  Administered 2018-08-01 – 2018-08-07 (×13): 100 mg via ORAL
  Filled 2018-08-01 (×13): qty 1

## 2018-08-01 MED ORDER — MOMETASONE FURO-FORMOTEROL FUM 200-5 MCG/ACT IN AERO
2.0000 | INHALATION_SPRAY | Freq: Two times a day (BID) | RESPIRATORY_TRACT | Status: DC
  Administered 2018-08-01 – 2018-08-07 (×13): 2 via RESPIRATORY_TRACT
  Filled 2018-08-01: qty 8.8

## 2018-08-01 MED ORDER — ALUM & MAG HYDROXIDE-SIMETH 200-200-20 MG/5ML PO SUSP: 30.0000 mL | Freq: Four times a day (QID) | ORAL | Status: DC | PRN

## 2018-08-01 MED ORDER — GUAIFENESIN ER 600 MG PO TB12: 600.0000 mg | ORAL_TABLET | Freq: Two times a day (BID) | ORAL | Status: DC

## 2018-08-01 MED ORDER — VITAMIN D3 25 MCG (1000 UNIT) PO TABS
2000.0000 [IU] | ORAL_TABLET | Freq: Every day | ORAL | Status: DC
  Administered 2018-08-01 – 2018-08-05 (×5): 2000 [IU] via ORAL
  Filled 2018-08-01 (×5): qty 2

## 2018-08-01 MED ORDER — SIMETHICONE 80 MG PO CHEW
80.0000 mg | CHEWABLE_TABLET | Freq: Three times a day (TID) | ORAL | Status: DC
  Administered 2018-08-01 – 2018-08-05 (×11): 80 mg via ORAL
  Filled 2018-08-01 (×14): qty 1

## 2018-08-01 MED ORDER — LOPERAMIDE HCL 2 MG PO CAPS: 2.0000 mg | ORAL_CAPSULE | ORAL | Status: DC | PRN

## 2018-08-01 MED ORDER — ASPIRIN 81 MG PO CHEW: 81.0000 mg | CHEWABLE_TABLET | Freq: Every day | ORAL | Status: DC

## 2018-08-01 MED ORDER — MAGNESIUM HYDROXIDE 400 MG/5ML PO SUSP: 30.0000 mL | Freq: Every evening | ORAL | Status: DC | PRN

## 2018-08-01 MED ORDER — PANTOPRAZOLE SODIUM 40 MG PO TBEC
40.0000 mg | DELAYED_RELEASE_TABLET | Freq: Every day | ORAL | Status: DC
  Administered 2018-08-01 – 2018-08-07 (×7): 40 mg via ORAL
  Filled 2018-08-01 (×7): qty 1

## 2018-08-01 MED ORDER — LORAZEPAM 0.5 MG PO TABS: 0.5000 mg | ORAL_TABLET | Freq: Three times a day (TID) | ORAL | Status: DC | PRN

## 2018-08-01 MED ORDER — POLYVINYL ALCOHOL 1.4 % OP SOLN
1.0000 [drp] | Freq: Two times a day (BID) | OPHTHALMIC | Status: DC
  Administered 2018-08-01 – 2018-08-02 (×3): 1 [drp] via OPHTHALMIC
  Filled 2018-08-01: qty 15

## 2018-08-01 MED ORDER — LEVOTHYROXINE SODIUM 25 MCG PO TABS
125.0000 ug | ORAL_TABLET | Freq: Every day | ORAL | Status: DC
  Administered 2018-08-02 – 2018-08-07 (×6): 125 ug via ORAL
  Filled 2018-08-01 (×6): qty 1

## 2018-08-01 MED ORDER — BUPROPION HCL ER (XL) 150 MG PO TB24
150.0000 mg | ORAL_TABLET | Freq: Every day | ORAL | Status: DC
  Administered 2018-08-01 – 2018-08-07 (×7): 150 mg via ORAL
  Filled 2018-08-01 (×7): qty 1

## 2018-08-01 MED ORDER — CALCIUM CARBONATE ANTACID 500 MG PO CHEW: 1.0000 | CHEWABLE_TABLET | Freq: Two times a day (BID) | ORAL | Status: DC | PRN

## 2018-08-01 MED ORDER — POTASSIUM CHLORIDE CRYS ER 20 MEQ PO TBCR: 20.0000 meq | EXTENDED_RELEASE_TABLET | Freq: Every day | ORAL | Status: DC

## 2018-08-01 MED ORDER — LORATADINE 10 MG PO TABS
10.0000 mg | ORAL_TABLET | Freq: Every day | ORAL | Status: DC
  Administered 2018-08-01 – 2018-08-05 (×5): 10 mg via ORAL
  Filled 2018-08-01 (×5): qty 1

## 2018-08-01 MED ORDER — FUROSEMIDE 10 MG/ML IJ SOLN
60.0000 mg | Freq: Two times a day (BID) | INTRAMUSCULAR | Status: DC
  Administered 2018-08-01: 60 mg via INTRAVENOUS
  Filled 2018-08-01 (×2): qty 6

## 2018-08-01 MED ORDER — NYSTATIN 100000 UNIT/GM EX POWD
1.0000 g | Freq: Two times a day (BID) | CUTANEOUS | Status: DC | PRN
  Filled 2018-08-01: qty 15

## 2018-08-01 MED ORDER — RISPERIDONE 0.25 MG PO TABS
0.2500 mg | ORAL_TABLET | Freq: Every evening | ORAL | Status: DC | PRN
  Filled 2018-08-01: qty 1

## 2018-08-01 MED ORDER — FERROUS SULFATE 325 (65 FE) MG PO TABS
325.0000 mg | ORAL_TABLET | ORAL | Status: DC
  Administered 2018-08-01 – 2018-08-03 (×2): 325 mg via ORAL
  Filled 2018-08-01 (×2): qty 1

## 2018-08-01 MED ORDER — UMECLIDINIUM BROMIDE 62.5 MCG/INH IN AEPB
1.0000 | INHALATION_SPRAY | Freq: Every day | RESPIRATORY_TRACT | Status: DC
  Administered 2018-08-01 – 2018-08-10 (×8): 1 via RESPIRATORY_TRACT
  Filled 2018-08-01: qty 7

## 2018-08-01 NOTE — Consult Note (Addendum)
Consultation Note Date: 08/01/2018   Patient Name: Tina [name]  DOB: May 01, 1928  MRN: [id number]  Age / Sex: 85 y.o., female  PCP: Patient, No Pcp Per Referring Physician: Gorden Harms, MD  Reason for Consultation: Establishing goals of care  HPI/Patient Profile: 85 y.o. female  with past medical history of CHF, HTN, CAD, COPD, diabetes, CKD stage 3, h/o colon cancer, obesity admitted on 07/29/2018 with SOB r/t CHF exacerbation and new onset atrial flutter/fib. Hospitalization complicated by continued need for aggressive diuresis and persistent atrial fib/flutter. She continues to require 4L nasal cannula and is very weak.   Clinical Assessment and Goals of Care: I met today at Tina [name]' bedside along with Tina, Tina [name]. They share that Tina [name] was minimally ambulatory but only able to transfer to wheelchair and to walk from wheelchair into her bathroom (wheelchair does not fit) very slowly. She was able to perform these tasks independently prior to admission. Appetite is reportedly good. She was not using oxygen either.   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 Tina [name] states that her sister, Tina [name], is HCPOA but that the 3 children communicate and work together (she has a 4th Tina out of town with her own health issues). I discussed the importance of discussing and having a plan for decisions they will face one day. We discussed life support and the importance of QOL. I gave her a copy of [facility] and encouraged further conversation as a family. I also offered for outpatient palliative services to assist and guide these conversations.   They are very hopeful for continued improvement. Tina [name] was appreciative of the conversation and recognizes the importance of having these conversations sooner than later.   Primary Decision Maker HCPOA  Tina Tina [name] (I spoke with Tina [name] today)    SUMMARY OF RECOMMENDATIONS   - Initial [place] conversation - No decisions made today  Code Status/Advance Care Planning:  Full code   Symptom Management:   Per primary and cardiology.   Palliative Prophylaxis:   Delirium Protocol, Frequent Pain Assessment and Turn Reposition  Additional Recommendations (Limitations, Scope, Preferences):  Full Scope Treatment unless family discussions lead to decisions for limitations  Psycho-social/Spiritual:   Desire for further Chaplaincy support:yes  Additional Recommendations: Caregiving  Support/Resources  Prognosis:   Overall prognosis poor. Short term prognosis to be determined based on progress over the next few days.   Discharge Planning: To Be Determined      Primary Diagnoses: Present on Admission: **None**   I have reviewed the medical record, interviewed the patient and family, and examined the patient. The following aspects are pertinent.  Past Medical History:  Diagnosis Date  . (HFpEF) heart failure with preserved ejection fraction ([facility])    a. 08/2015 Echo: EF 60%. Nl right sided pressures.  . Anemia   . Anxiety   . Bronchitis   . CAD (coronary artery disease)    a. Reports 2 MI's in her 86's - details  unknown; b. s/p prior PCI (no details/timing); b. 10/2006 MV: No sichemia/infarct. EF 77%; c. 2018 MV: reportedly nl per pt.  . CKD stage 3 due to type 2 diabetes mellitus ([facility])   . Colon cancer ([facility])    treated, surgery, chemo, radiation  . COPD (chronic obstructive pulmonary disease) ([facility])    on 2L home o2  . Diabetes mellitus without complication ([place])   . Obesity   . Osteoarthritis    Social History   Socioeconomic History  . Marital status: Divorced    Spouse name: Not on file  . Number of children: Not on file  . Years of education: Not on file  . Highest education level: Not on file  Occupational History  . Not on file  Social Needs  . Financial  resource strain: Not on file  . Food insecurity:    Worry: Not on file    Inability: Not on file  . Transportation needs:    Medical: Not on file    Non-medical: Not on file  Tobacco Use  . Smoking status: Former Research scientist (life sciences)  . Smokeless tobacco: Never Used  . Tobacco comment: quit nearly 40 yrs ago (age 46).  Substance and Sexual Activity  . Alcohol use: No    Alcohol/week: 0.0 standard drinks  . Drug use: No  . Sexual activity: Not on file  Lifestyle  . Physical activity:    Days per week: Not on file    Minutes per session: Not on file  . Stress: Not on file  Relationships  . Social connections:    Talks on phone: Not on file    Gets together: Not on file    Attends religious service: Not on file    Active member of club or organization: Not on file    Attends meetings of clubs or organizations: Not on file    Relationship status: Not on file  Other Topics Concern  . Not on file  Social History Narrative   Lives at [facility] assisted living   Wheel chair bound   Family History  Problem Relation Age of Onset  . Diabetes Mellitus II Mother    Scheduled Meds: . apixaban  2.5 mg Oral BID  . atorvastatin  40 mg Oral q1800  . buPROPion  150 mg Oral Daily  . cholecalciferol  2,000 Units Oral Daily  . docusate sodium  100 mg Oral BID  . donepezil  5 mg Oral Daily  . ferrous sulfate  325 mg Oral Once per day on Mon Wed Fri  . furosemide  60 mg Intravenous BID  . insulin aspart  0-5 Units Subcutaneous QHS  . insulin aspart  0-9 Units Subcutaneous TID WC  . levothyroxine  125 mcg Oral QAC breakfast  . loratadine  10 mg Oral Daily  . metoprolol tartrate  12.5 mg Oral Q6H  . mometasone-formoterol  2 puff Inhalation BID  . pantoprazole  40 mg Oral Daily  . polyvinyl alcohol  1 drop Both Eyes BID  . potassium chloride  30 mEq Oral Daily  . sertraline  50 mg Oral Daily  . simethicone  80 mg Oral Q8H  . sodium chloride flush  3 mL Intravenous Q12H  . umeclidinium bromide   1 puff Inhalation Daily   Continuous Infusions: PRN Meds:.acetaminophen **OR** acetaminophen, albuterol, alum & mag hydroxide-simeth, calcium carbonate, ipratropium-albuterol, lactulose, loperamide, LORazepam, magnesium hydroxide, nystatin, ondansetron **OR** ondansetron (ZOFRAN) IV, polyethylene glycol, risperiDONE Allergies  Allergen Reactions  . Sulfamethoxazole-Trimethoprim Other (See  Comments)    Uncoded Allergy. Allergen: iv contrast dye, Other Reaction: Brkthru reaction Hives/itching  . Aspirin Other (See Comments)  . Clarithromycin Other (See Comments)  . Codeine Other (See Comments)    Other Reaction: Not Assessed  . Penicillins Other (See Comments)   Review of Systems  Unable to perform ROS: Dementia  Constitutional: Positive for activity change and fatigue. Negative for appetite change.  Respiratory: Positive for shortness of breath.   Cardiovascular: Positive for leg swelling.  Neurological: Positive for weakness.    Physical Exam Vitals signs and nursing note reviewed.  Constitutional:      General: She is not in acute distress.    Appearance: She is morbidly obese. She is ill-appearing.  Cardiovascular:     Rate and Rhythm: Tachycardia present. Rhythm regularly irregular.  Pulmonary:     Effort: No tachypnea or accessory muscle usage.     Comments: Mild SOB lying in bed and increases with any activity (talking, eating). Unable to lie flat or sit up very high in bed.  Neurological:     Mental Status: She is alert. Mental status is at baseline. She is confused.     Comments: Oriented x 1     Vital Signs: BP (!) 95/51   Pulse 91   Temp 98 F (36.7 C)   Resp 18   Ht '5\' 7"'$  (1.702 m)   Wt 117.4 kg   SpO2 91%   BMI 40.55 kg/m  Pain Scale: 0-10   Pain Score: 0-No pain   SpO2: SpO2: 91 % O2 Device:SpO2: 91 % O2 Flow Rate: .O2 Flow Rate (L/min): 4 L/min  IO: Intake/output summary:   Intake/Output Summary (Last 24 hours) at 08/01/2018 1531 Last data filed  at 08/01/2018 1437 Gross per 24 hour  Intake 3 ml  Output 1450 ml  Net -1447 ml    LBM: Last BM Date: 07/28/18 Baseline Weight: Weight: 124.6 kg Most recent weight: Weight: 117.4 kg     Palliative Assessment/Data:   Flowsheet Rows     Most Recent Value  Intake Tab  Referral Department  Hospitalist  Unit at Time of Referral  Cardiac/Telemetry Unit  Palliative Care Primary Diagnosis  Cardiac  Date Notified  08/01/18  Palliative Care Type  New Palliative care  Reason for referral  Clarify Goals of Care  Date of Admission  07/29/18  # of days IP prior to Palliative referral  3  Clinical Assessment  Psychosocial & Spiritual Assessment  Palliative Care Outcomes      Time In: 1300 Time Out: 1415 Time Total: 75 min Greater than 50%  of this time was spent counseling and coordinating care related to the above assessment and plan.  Signed by: Vinie Sill, NP Palliative Medicine Team Pager # [phone] (M-F 8a-5p) Team Phone # [phone] (Nights/Weekends)

## 2018-08-01 NOTE — Progress Notes (Signed)
RT In room with ICU charge nurse to assess patient status. Patient placed on HFNC at 50 liters and 40 percent and progressively weaned down to 30liters and 35% for patient comfort. Patients saturations on this were in the mid to upper 90's. Will continue to monitor.

## 2018-08-01 NOTE — Progress Notes (Signed)
Patient placed on regular high flow nasal cannula at 6 liters. Saturations remained 92-95%. Patient more comfortable on this than NRB and Heated High Flow Nasal Cannula.

## 2018-08-01 NOTE — Progress Notes (Signed)
Progress Note  Patient Name: Tina [name] Date of Encounter: 08/01/2018  Primary Cardiologist: Kathlyn Sacramento, MD   Subjective   Still short of breath.  Notices some fullness and tightness in her chest and neck when sitting forward.  Inpatient Medications    Scheduled Meds: . apixaban  2.5 mg Oral BID  . atorvastatin  40 mg Oral q1800  . buPROPion  150 mg Oral Daily  . cholecalciferol  2,000 Units Oral Daily  . docusate sodium  100 mg Oral BID  . donepezil  5 mg Oral Daily  . ferrous sulfate  325 mg Oral Once per day on Mon Wed Fri  . furosemide  40 mg Intravenous BID  . insulin aspart  0-5 Units Subcutaneous QHS  . insulin aspart  0-9 Units Subcutaneous TID WC  . levothyroxine  125 mcg Oral QAC breakfast  . loratadine  10 mg Oral Daily  . metoprolol tartrate  12.5 mg Oral Q6H  . mometasone-formoterol  2 puff Inhalation BID  . pantoprazole  40 mg Oral Daily  . polyvinyl alcohol  1 drop Both Eyes BID  . potassium chloride  30 mEq Oral Daily  . sertraline  50 mg Oral Daily  . simethicone  80 mg Oral Q8H  . sodium chloride flush  3 mL Intravenous Q12H  . umeclidinium bromide  1 puff Inhalation Daily   Continuous Infusions:  PRN Meds: acetaminophen **OR** acetaminophen, albuterol, alum & mag hydroxide-simeth, calcium carbonate, lactulose, loperamide, LORazepam, magnesium hydroxide, nystatin, ondansetron **OR** ondansetron (ZOFRAN) IV, polyethylene glycol, risperiDONE   Vital Signs    Vitals:   07/31/18 2045 08/01/18 0303 08/01/18 0547 08/01/18 0737  BP: 134/82 (!) 95/58  129/65  Pulse: (!) 112 88  99  Resp: 20 20  18   Temp: 98 F (36.7 C) 98 F (36.7 C)  98 F (36.7 C)  TempSrc: Oral Oral    SpO2: 99% 94% 93% 91%  Weight:  117.4 kg    Height:        Intake/Output Summary (Last 24 hours) at 08/01/2018 1149 Last data filed at 08/01/2018 0303 Gross per 24 hour  Intake 3 ml  Output 950 ml  Net -947 ml   Filed Weights   07/29/18 1050 07/30/18 0455 08/01/18 0303    Weight: 124.6 kg 118.8 kg 117.4 kg    Telemetry    Atrial fibrillation versus flutter with ventricular rates of 100 to 130 bpm. - Personally Reviewed  ECG    No new tracing.  Physical Exam   GEN: No acute distress.  Son is at the bedside. Neck:  JVP difficult to assess due to body habitus. Cardiac:  Tachycardic and irregularly irregular with distant heart sounds.  No obvious murmur. Respiratory:  Mildly diminished breath sounds throughout without wheezes or crackles. GI:  Obese, soft.  Mild right-sided tenderness. MS:  1+ pretibial edema. Neuro:  Nonfocal  Psych: Normal affect   Labs    Chemistry Recent Labs  Lab 07/29/18 1057 07/30/18 0653 07/31/18 0702 08/01/18 1028  NA  --  139 139 138  K  --  3.4* 3.4* 4.0  CL  --  100 99 99  CO2  --  31 33* 31  GLUCOSE  --  97 110* 106*  BUN  --  28* 28* 28*  CREATININE  --  1.84* 1.77* 1.75*  CALCIUM  --  8.3* 8.4* 8.7*  PROT 6.3*  --   --   --   ALBUMIN 3.3*  --   --   --  AST 28  --   --   --   ALT 15  --   --   --   ALKPHOS 69  --   --   --   BILITOT 1.0  --   --   --   GFRNONAA  --  24* 25* 26*  GFRAA  --  28* 29* 30*  ANIONGAP  --  8 7 8      Hematology Recent Labs  Lab 07/29/18 1052 07/30/18 0653  WBC 8.0 9.5  RBC 4.49 4.24  HGB 10.9* 10.3*  HCT 39.6 37.7  MCV 88.2 88.9  MCH 24.3* 24.3*  MCHC 27.5* 27.3*  RDW 17.7* 17.7*  PLT 242 239    Cardiac Enzymes Recent Labs  Lab 07/29/18 1052  TROPONINI 0.06*   No results for input(s): TROPIPOC in the last 168 hours.   BNP Recent Labs  Lab 07/29/18 1057  BNP 325.0*     DDimer No results for input(s): DDIMER in the last 168 hours.   Radiology    No results found.  Cardiac Studies   Echo (07/30/2018): Procedure narrative: The only obtainable view was parasternal. - Left ventricle: The cavity size was mildly dilated. There was mild concentric hypertrophy. Systolic function was moderately to severely reduced. The estimated ejection fraction  was in the range of 30% to 35%. Severe hypokinesis of the anteroseptal andanterior myocardium. The study is not technically sufficient to allow evaluation of LV diastolic function. - Mitral valve: Calcified annulus. - Right ventricle: The cavity size was mildly dilated. Wall thickness was normal. Systolic function was mildly reduced.  Patient Profile     85 y.o. female with a history of HFrEF (07/30/18 EF 30-35%), CAD/MI, DMII, COPD, CKD III, dementia, colon cancer, anemia, and obesity, who is being seen today for the evaluation ofafib, CHF, and elevated troponin.  Assessment & Plan    Acute on chronic systolic heart failure Overall, not much improvement based on symptoms since yesterday.  Urine output was about 1 L, though intake not recorded.  Weight is down 3 pounds compared with 2 days ago.  Creatinine stable since yesterday.  Increase furosemide to 60 mg IV twice daily.  Escalate metoprolol tartrate to 12.5 mg every 6 hours.  Long-term, will try to transition to evidence-based heart failure beta-blocker before discharge.  Defer ischemia evaluation, given continued decompensated heart failure and multiple comorbidities.  Persistent atrial fibrillation/flutter Heart rate remains suboptimally controlled, but at least metoprolol was given yesterday based on blood pressure hold parameters.  Increase metoprolol tartrate to 12.5 mg every 6 hours for target resting heart rate less than 110 bpm.  Continue apixaban 2.5 mg twice daily (dose adjusted based on age and renal function).  Coronary artery disease I suspect mild troponin elevation this admission reflects demand ischemia.  Medical therapy.  No plans for cardiac catheterization during this admission given comorbidities.  Chronic kidney disease stage IV Renal function stable.  Diuresis, as above, with close monitoring of renal function.  Avoid nephrotoxic drugs.  For questions or updates, please contact [place] Please consult [email or web address] for contact info under [facility].     Signed, Nelva Bush, MD  08/01/2018, 11:49 AM

## 2018-08-01 NOTE — Progress Notes (Signed)
C/o worsening sob, ABG noted, cxr noted for chf, transfer to stepdown for Bipap/further eval.

## 2018-08-01 NOTE — Progress Notes (Signed)
PT Cancellation Note  Patient Details Name: Tina [name] MRN: [id number] DOB: 1928-04-07   Cancelled Treatment:    Reason Eval/Treat Not Completed: Other (comment);Medical issues which prohibited therapy(upon entering room, pt spO2 in low 70s on 5L oxygen, assessed at multiple sites with consistent reading. RN alerted, entered room to assess pt. PT to hold at this time until pt is more medically appropriate. )  Lieutenant Diego PT, DPT 2:59 PM,08/01/18 [phone]

## 2018-08-01 NOTE — Care Management Important Message (Signed)
Copy of signed Medicare IM left with patient in room. 

## 2018-08-01 NOTE — Progress Notes (Signed)
o2 SATS in 70s on %L, RRT to bedside, patient placed on non-rebreather, O2 sats 100% with  Non-rebreather, BP 99/49, carvedilol dose was held d/t SBP in 80s, MD notified, orders given for duoneb, ABG, and CXR STAT, will follow orders and continue to monitor.

## 2018-08-02 LAB — BASIC METABOLIC PANEL
Anion gap: 8 (ref 5–15)
BUN: 32 mg/dL — ABNORMAL HIGH (ref 8–23)
CO2: 32 mmol/L (ref 22–32)
Calcium: 8.7 mg/dL — ABNORMAL LOW (ref 8.9–10.3)
Chloride: 98 mmol/L (ref 98–111)
Creatinine, Ser: 1.79 mg/dL — ABNORMAL HIGH (ref 0.44–1.00)
GFR calc Af Amer: 29 mL/min — ABNORMAL LOW (ref >60)
GFR, EST NON AFRICAN AMERICAN: 25 mL/min — AB (ref >60)
Glucose, Bld: 124 mg/dL — ABNORMAL HIGH (ref 70–99)
POTASSIUM: 4 mmol/L (ref 3.5–5.1)
Sodium: 138 mmol/L (ref 135–145)

## 2018-08-02 LAB — GLUCOSE, CAPILLARY
Glucose-Capillary: 111 mg/dL — ABNORMAL HIGH (ref 70–99)
Glucose-Capillary: 115 mg/dL — ABNORMAL HIGH (ref 70–99)

## 2018-08-02 MED ORDER — FUROSEMIDE 10 MG/ML IJ SOLN
60.0000 mg | Freq: Two times a day (BID) | INTRAMUSCULAR | Status: DC
  Administered 2018-08-02 – 2018-08-07 (×11): 60 mg via INTRAVENOUS
  Filled 2018-08-02 (×10): qty 6

## 2018-08-02 MED ORDER — INSULIN ASPART 100 UNIT/ML ~~LOC~~ SOLN: 0.0000 [IU] | Freq: Three times a day (TID) | SUBCUTANEOUS | Status: DC

## 2018-08-02 MED ORDER — POLYVINYL ALCOHOL 1.4 % OP SOLN
1.0000 [drp] | Freq: Three times a day (TID) | OPHTHALMIC | Status: DC
  Administered 2018-08-02 – 2018-08-10 (×20): 1 [drp] via OPHTHALMIC
  Filled 2018-08-02: qty 15

## 2018-08-02 MED ORDER — FUROSEMIDE 10 MG/ML IJ SOLN: 80.0000 mg | Freq: Two times a day (BID) | INTRAMUSCULAR | Status: DC

## 2018-08-02 MED ORDER — INSULIN GLARGINE 100 UNIT/ML ~~LOC~~ SOLN
25.0000 [IU] | Freq: Every day | SUBCUTANEOUS | Status: DC
  Filled 2018-08-02: qty 0.25

## 2018-08-02 MED ORDER — IPRATROPIUM-ALBUTEROL 0.5-2.5 (3) MG/3ML IN SOLN
3.0000 mL | Freq: Four times a day (QID) | RESPIRATORY_TRACT | Status: DC
  Administered 2018-08-02 – 2018-08-03 (×5): 3 mL via RESPIRATORY_TRACT
  Filled 2018-08-02 (×5): qty 3

## 2018-08-02 MED ORDER — METOPROLOL TARTRATE 25 MG PO TABS: 12.5000 mg | ORAL_TABLET | Freq: Four times a day (QID) | ORAL | Status: DC

## 2018-08-02 MED ORDER — INSULIN ASPART 100 UNIT/ML ~~LOC~~ SOLN: 0.0000 [IU] | Freq: Every day | SUBCUTANEOUS | Status: DC

## 2018-08-02 MED ORDER — METOPROLOL TARTRATE 25 MG PO TABS
25.0000 mg | ORAL_TABLET | Freq: Four times a day (QID) | ORAL | Status: DC
  Administered 2018-08-02 – 2018-08-03 (×4): 25 mg via ORAL
  Filled 2018-08-02 (×4): qty 1

## 2018-08-02 MED ORDER — METHYLPREDNISOLONE SODIUM SUCC 125 MG IJ SOLR
60.0000 mg | Freq: Four times a day (QID) | INTRAMUSCULAR | Status: DC
  Administered 2018-08-02 – 2018-08-04 (×8): 60 mg via INTRAVENOUS
  Filled 2018-08-02 (×8): qty 2

## 2018-08-02 MED ORDER — HYPROMELLOSE (GONIOSCOPIC) 2.5 % OP SOLN
1.0000 [drp] | Freq: Three times a day (TID) | OPHTHALMIC | Status: DC
  Filled 2018-08-02: qty 15

## 2018-08-02 MED ORDER — INSULIN GLARGINE 100 UNITS/ML SOLOSTAR PEN: 25.0000 [IU] | PEN_INJECTOR | Freq: Every day | SUBCUTANEOUS | Status: DC

## 2018-08-02 NOTE — Progress Notes (Deleted)
Physical Therapy Treatment Patient Details Name: Tina [name] MRN: [id number] DOB: [birth year] Today's Date: 08/02/2018    History of Present Illness Pt is 85 yo female admitted for SOB, leg edema, exertional dyspnea, PMH of history of HFrEF (07/30/18 EF 30-35%), CAD/MI, DMII, COPD, CKD III, dementia, colon cancer, anemia, and obesity. New onset of afib this admission as well, cardiology following.     PT Comments    Patient oriented to self, situation, behavior WFLs during session, intermittent cues to attend to task needed. Pt with some confusion about PLOF, but family entered room to provide. Per family, pt able to transfer from bed to wheelchair without assistance, ambulated very short distances, lives at [facility], assistance needed for bathing, able to dress herself per family.  Pt BP at rest 94/61, HR 94, spO2 on 6L HFNC 99%. Upon assessment pt unable to move LLE without assistance and with pain, stated that her L leg is always her worse leg. With beginning of mobility with severe coughing, desatted to 80s, recovered with cues for PLB. Pt modAx1 from supine to sit and to scoot to EOB. Mild SOB noted, spO2 in mid80s, able to recover to low 90s with time, CGA verbal cues. Pt quickly fatigued, anxious, and with poor postural control at EOB, further mobility held. The patient demonstrated significant limitations in functional mobility compared to baseline and would benefit from skilled PT intervention. Recommendation is STR to maximize mobility, independence, and safety at this time.     Follow Up Recommendations  SNF     Equipment Recommendations  Other (comment)(TBD at next venue of care)    Recommendations for Other Services       Precautions / Restrictions Precautions Precaution Comments: watch HR and O2 Restrictions Weight Bearing Restrictions: No    Mobility  Bed Mobility Overal bed mobility: Needs Assistance Bed Mobility: Supine to Sit;Sit to Supine     Supine to  sit: Mod assist Sit to supine: Mod assist;+2 for physical assistance   General bed mobility comments: heavy use of bed rails, HOB elevated.   Transfers                 General transfer comment: deferred. pt desatted EOB to mid 80s on HFNC at 6L, able to recover within several minutes and cues to attend to task/breathing technique. Pt fatigued quickly, UE needed.   Ambulation/Gait                 Stairs             Wheelchair Mobility    Modified Rankin (Stroke Patients Only)       Balance Overall balance assessment: Needs assistance Sitting-balance support: Feet supported;Single extremity supported Sitting balance-Leahy Scale: Poor                                      Cognition Arousal/Alertness: Awake/alert Behavior During Therapy: WFL for tasks assessed/performed Overall Cognitive Status: Within Functional Limits for tasks assessed                                 General Comments: oriented to self, location      Exercises      General Comments        Pertinent Vitals/Pain Pain Assessment: No/denies pain    Home Living Family/patient expects to be discharged to::  Assisted living               Additional Comments: Lives at [place]    Prior Function Level of Independence: Needs assistance  Gait / Transfers Assistance Needed: Family at bedside to confirm. Pt able to independently transfer from bed to [facility] and propel self. Very short walks, does not like to recieve assistance ADL's / Homemaking Assistance Needed: needs assistance with bathing, family states pt is able to dress herself     PT Goals (current goals can now be found in the care plan section) Acute Rehab PT Goals Patient Stated Goal: Pt wants to breathe better PT Goal Formulation: With patient Time For Goal Achievement: 08/16/18 Potential to Achieve Goals: Fair    Frequency    Min 2X/week      PT Plan      Co-evaluation               AM-PAC PT "6 Clicks" Mobility   Outcome Measure  Help needed turning from your back to your side while in a flat bed without using bedrails?: A Lot Help needed moving from lying on your back to sitting on the side of a flat bed without using bedrails?: A Lot Help needed moving to and from a bed to a chair (including a wheelchair)?: Total Help needed standing up from a chair using your arms (e.g., wheelchair or bedside chair)?: Total Help needed to walk in hospital room?: Total Help needed climbing 3-5 steps with a railing? : Total 6 Click Score: 8    End of Session Equipment Utilized During Treatment: Oxygen(6L HFNC) Activity Tolerance: Patient limited by fatigue;Treatment limited secondary to medical complications (Comment);Other (comment)(oxygen needs) Patient left: in bed;with nursing/sitter in room Nurse Communication: Mobility status PT Visit Diagnosis: Unsteadiness on feet (R26.81);Other abnormalities of gait and mobility (R26.89);Muscle weakness (generalized) (M62.81)     Time: 3845-3646 PT Time Calculation (min) (ACUTE ONLY): 39 min  Charges:  $Therapeutic Activity: 23-37 mins                     Lieutenant Diego PT, DPT 11:47 AM,08/02/18 [phone]

## 2018-08-02 NOTE — Progress Notes (Signed)
Palliative:  I met again today with Tina [name] and her son, Tina [name], at bedside. Tina [name] had discussed with Tina [name] our conversation yesterday. He asks good questions and says his daughter is an Therapist, sports. We discussed CHF EF 48-35% complicated by atrial fibrillation. I explained that we are diuresing but watching her renal function and blood pressure carefully. He understands the difficulty of these disease processes and the trajectory. He also understands the importance of discussing her wishes including intubation.   All questions/concerns addressed. Emotional support provided. I also provided Tina [name] a [employer]. He updates his sister, Tina [name], regarding our conversation via telephone.   Palliative will follow up next week. Please call with any further palliative needs prior to Tuesday. Family aware to call with any questions/concerns.   Exam: Alert, oriented x 2. No distress. Tolerating 6L nasal cannula with no respiratory distress. Resting comfortably.   Plan: - Family plan to discuss wishes and decisions together. No decisions made. Encouraged discussion regarding intubation/resuscitation.  103 min  Vinie Sill, NP Palliative Medicine Team Pager # [phone] (M-F 8a-5p) Team Phone # [phone] (Nights/Weekends)

## 2018-08-02 NOTE — Progress Notes (Signed)
Spoke to Dr. Saunders Revel. He advised to pursue am Lasix dose to alleviate increasing hypoxia. Aware of BP, although pt is asymptomatic. Will continue to monitor.

## 2018-08-02 NOTE — Progress Notes (Signed)
Progress Note  Patient Name: Tina [name] Date of Encounter: 08/02/2018  Primary Cardiologist: Kathlyn Sacramento, MD   Subjective   Patient with no cardiac complaints this morning of chest pain or racing heart rate. She does remain SOB with some improvement now that on high flow [place].   Inpatient Medications    Scheduled Meds: . apixaban  2.5 mg Oral BID  . atorvastatin  40 mg Oral q1800  . buPROPion  150 mg Oral Daily  . cholecalciferol  2,000 Units Oral Daily  . docusate sodium  100 mg Oral BID  . donepezil  5 mg Oral Daily  . ferrous sulfate  325 mg Oral Once per day on Mon Wed Fri  . furosemide  60 mg Intravenous BID  . insulin aspart  0-5 Units Subcutaneous QHS  . insulin aspart  0-9 Units Subcutaneous TID WC  . levothyroxine  125 mcg Oral QAC breakfast  . loratadine  10 mg Oral Daily  . metoprolol tartrate  25 mg Oral Q6H  . mometasone-formoterol  2 puff Inhalation BID  . pantoprazole  40 mg Oral Daily  . polyvinyl alcohol  1 drop Both Eyes BID  . potassium chloride  30 mEq Oral Daily  . sertraline  50 mg Oral Daily  . simethicone  80 mg Oral Q8H  . sodium chloride flush  3 mL Intravenous Q12H  . umeclidinium bromide  1 puff Inhalation Daily   Continuous Infusions:  PRN Meds: acetaminophen **OR** acetaminophen, albuterol, alum & mag hydroxide-simeth, calcium carbonate, ipratropium-albuterol, lactulose, loperamide, LORazepam, magnesium hydroxide, nystatin, ondansetron **OR** ondansetron (ZOFRAN) IV, polyethylene glycol, risperiDONE   Vital Signs    Vitals:   08/01/18 1928 08/01/18 2100 08/02/18 0308 08/02/18 0513  BP: (!) 104/54  (!) 98/54 119/76  Pulse: 93  93 (!) 114  Resp: 19  20   Temp: 98.1 F (36.7 C)  98.3 F (36.8 C)   TempSrc: Oral  Oral   SpO2: 93% 95% 90%   Weight:   119.5 kg   Height:        Intake/Output Summary (Last 24 hours) at 08/02/2018 0744 Last data filed at 08/02/2018 0308 Gross per 24 hour  Intake 480 ml  Output 950 ml  Net -470 ml    Filed Weights   07/30/18 0455 08/01/18 0303 08/02/18 0308  Weight: 118.8 kg 117.4 kg 119.5 kg    Telemetry    Course atrial fibrillation/atrial flutter, ventricular rates improved 90s--120s bpm- Personally Reviewed  ECG    No new tracings- Personally Reviewed   Physical Exam   GEN: No acute distress.   Neck:  JVP elevated at level of mandible Cardiac:  IRIR, distant heart sounds. no murmurs, rubs, or gallops.  Respiratory:  Diminished breath sounds bilaterally GI:  Obese, soft, nontender  MS: 1+ bilateral lower extremity edema, no deformity. Neuro:  Nonfocal  Psych: Normal affect   Labs    Chemistry Recent Labs  Lab 07/29/18 1057 07/30/18 0653 07/31/18 0702 08/01/18 1028  NA  --  139 139 138  K  --  3.4* 3.4* 4.0  CL  --  100 99 99  CO2  --  31 33* 31  GLUCOSE  --  97 110* 106*  BUN  --  28* 28* 28*  CREATININE  --  1.84* 1.77* 1.75*  CALCIUM  --  8.3* 8.4* 8.7*  PROT 6.3*  --   --   --   ALBUMIN 3.3*  --   --   --  AST 28  --   --   --   ALT 15  --   --   --   ALKPHOS 69  --   --   --   BILITOT 1.0  --   --   --   GFRNONAA  --  24* 25* 26*  GFRAA  --  28* 29* 30*  ANIONGAP  --  8 7 8      Hematology Recent Labs  Lab 07/29/18 1052 07/30/18 0653  WBC 8.0 9.5  RBC 4.49 4.24  HGB 10.9* 10.3*  HCT 39.6 37.7  MCV 88.2 88.9  MCH 24.3* 24.3*  MCHC 27.5* 27.3*  RDW 17.7* 17.7*  PLT 242 239    Cardiac Enzymes Recent Labs  Lab 07/29/18 1052  TROPONINI 0.06*   No results for input(s): TROPIPOC in the last 168 hours.   BNP Recent Labs  Lab 07/29/18 1057  BNP 325.0*     DDimer No results for input(s): DDIMER in the last 168 hours.   Radiology    Dg Chest 2 View  Result Date: 08/01/2018 CLINICAL DATA:  Hypoxia, shortness of breath. EXAM: CHEST - 2 VIEW COMPARISON:  Radiographs of July 29, 2018. FINDINGS: Stable cardiomegaly with central pulmonary vascular congestion. Atherosclerosis of thoracic aorta is noted. No pneumothorax is noted.  Mild bilateral pleural effusions are noted. Minimal pulmonary edema may be present. Bony thorax is unremarkable. IMPRESSION: Stable cardiomegaly with central pulmonary vascular congestion and possible minimal pulmonary edema. Mild bilateral pleural effusions are noted. Aortic Atherosclerosis (ICD10-I70.0). Electronically Signed   By: Marijo Conception, M.D.   On: 08/01/2018 15:51    Cardiac Studies   07/30/2018 TTE - Procedure narrative: The only obtainable view was parasternal. - Left ventricle: The cavity size was mildly dilated. There was   mild concentric hypertrophy. Systolic function was moderately to   severely reduced. The estimated ejection fraction was in the   range of 30% to 35%. Severe hypokinesis of the anteroseptal and   anterior myocardium. The study is not technically sufficient to   allow evaluation of LV diastolic function. - Mitral valve: Calcified annulus. - Right ventricle: The cavity size was mildly dilated. Wall   thickness was normal. Systolic function was mildly reduced.  Patient Profile     85 y.o. female with a history of HFrEF (07/30/18 EF 30-35%), CAD/MI, DMII, COPD, CKD III, dementia, colon cancer, anemia, and obesity, who is being seen today for the evaluation of afib, CHF, and elevated troponin.  Assessment & Plan    Acute on chronic systolic heart failure -Admitted 07/29/2026 -progressive dyspnea, orthopnea, and lower extremity edema culminating in a 2-day history of hypoxia.  Remains SOB and volume overloaded on exam with LEE and elevated JVP to mandible. TTE above; EF 30-35%. On high flow [place] at 6.75L with O2sats 92-95% -Continue IV diuresis with increased dosage of 60 mg IV BID and pending BMET results to assess renal function / Cr (1/8 Cr ~1.75). Continue to monitor I's/O, daily weights. Will need transition to evidence based HF BB for long term management before discharge. Defer ischemic evaluation for now given decompensated HF and multiple  comorbidities.  Afib with RVR -Presumed new. CHA2DS2VASc score of at least  6 (CHF, agex2, vascular DM2, female). Continue anticoagulation with adjusted dosage of Eliquis 2.5 mg twice daily. Continue metoprolol 12.5mg  q6h & for rate control and target HR less than 110bpm. Continue to document vitals and medications given in EMR to assist with medical management.  Elevated troponin / CAD -No reported CP. Troponin minimally elevated and likely 2/2 demand ischemia in the setting of uncontrolled ventricular rates with acute on chronic HFrEF and the above. Not an ideal candidate for invasive ischemic cardiac evaluation. No plans for cardiac catheterization this admission.  Continue medical therapy including statin therapy with follow-up as an outpatient for labs.   CKD IV - Daily BMET- continue to monitor renal function and electrolytes in the setting of ongoing diuresis.  Avoid nephroptoxins.  Renal dosage for medications such as Eliquis as above.   DM2 -SSI; 1/5 A1C 6.2; Per IM  COPD -Chronic DOE, wears O2 in the setting of COPD; on high flow [place].    For questions or updates, please contact [facility] Please consult [email or web address] for contact info under        Signed, Arvil Chaco, PA-C  08/02/2018, 7:44 AM

## 2018-08-02 NOTE — Progress Notes (Signed)
Sent a message to Dr. Jerelyn Charles regarding low BP. held Lasix. Awaiting response

## 2018-08-02 NOTE — Care Management (Signed)
Barrier to discharge-respiratory distress overnight.  Now on high flow oxygen at 6L.  Continues with IV solumedrol.

## 2018-08-02 NOTE — Progress Notes (Signed)
[facility] [place] NAME: Tina [name]    MR#:  [id number]  DATE OF BIRTH:  19-May-1928  SUBJECTIVE:  No complaints, not a diabetic per nursing -discontinue insulin Accu-Cheks Patient with complaints of respiratory distress overnight-stabilized with high flow oxygen, will resume home CPAP-settings per respiratory therapy, start aggressive pulmonary toilet with bronchodilator therapy, IV Solu-Medrol   REVIEW OF SYSTEMS:  CONSTITUTIONAL: No fever, fatigue or weakness.  EYES: No blurred or double vision.  EARS, NOSE, AND THROAT: No tinnitus or ear pain.  RESPIRATORY: No cough, shortness of breath, wheezing or hemoptysis.  CARDIOVASCULAR: No chest pain, orthopnea, edema.  GASTROINTESTINAL: No nausea, vomiting, diarrhea or abdominal pain.  GENITOURINARY: No dysuria, hematuria.  ENDOCRINE: No polyuria, nocturia,  HEMATOLOGY: No anemia, easy bruising or bleeding SKIN: No rash or lesion. MUSCULOSKELETAL: No joint pain or arthritis.   NEUROLOGIC: No tingling, numbness, weakness.  PSYCHIATRY: No anxiety or depression.   ROS  DRUG ALLERGIES:   Allergies  Allergen Reactions  . Sulfamethoxazole-Trimethoprim Other (See Comments)    Uncoded Allergy. Allergen: iv contrast dye, Other Reaction: Brkthru reaction Hives/itching  . Aspirin Other (See Comments)  . Clarithromycin Other (See Comments)  . Codeine Other (See Comments)    Other Reaction: Not Assessed  . Penicillins Other (See Comments)    VITALS:  Blood pressure 106/78, pulse 98, temperature 98.3 F (36.8 C), temperature source Oral, resp. rate 20, height 5\' 7"  (1.702 m), weight 119.5 kg, SpO2 91 %.  PHYSICAL EXAMINATION:  GENERAL:  85 y.o.-year-old patient lying in the bed with no acute distress.  EYES: Pupils equal, round, reactive to light and accommodation. No scleral icterus. Extraocular muscles intact.  HEENT: Head atraumatic, normocephalic. Oropharynx and nasopharynx clear.  NECK:   Supple, no jugular venous distention. No thyroid enlargement, no tenderness.  LUNGS: Normal breath sounds bilaterally, no wheezing, rales,rhonchi or crepitation. No use of accessory muscles of respiration.  CARDIOVASCULAR: S1, S2 normal. No murmurs, rubs, or gallops.  ABDOMEN: Soft, nontender, nondistended. Bowel sounds present. No organomegaly or mass.  EXTREMITIES: No pedal edema, cyanosis, or clubbing.  NEUROLOGIC: Cranial nerves II through XII are intact. Muscle strength 5/5 in all extremities. Sensation intact. Gait not checked.  PSYCHIATRIC: The patient is alert and oriented x 3.  SKIN: No obvious rash, lesion, or ulcer.   Physical Exam LABORATORY PANEL:   CBC Recent Labs  Lab 07/30/18 0653  WBC 9.5  HGB 10.3*  HCT 37.7  PLT 239   ------------------------------------------------------------------------------------------------------------------  Chemistries  Recent Labs  Lab 07/29/18 1057  07/31/18 0702  08/02/18 0728  NA  --    < > 139   < > 138  K  --    < > 3.4*   < > 4.0  CL  --    < > 99   < > 98  CO2  --    < > 33*   < > 32  GLUCOSE  --    < > 110*   < > 124*  BUN  --    < > 28*   < > 32*  CREATININE  --    < > 1.77*   < > 1.79*  CALCIUM  --    < > 8.4*   < > 8.7*  MG  --   --  2.2  --   --   AST 28  --   --   --   --   ALT 15  --   --   --   --  ALKPHOS 69  --   --   --   --   BILITOT 1.0  --   --   --   --    < > = values in this interval not displayed.   ------------------------------------------------------------------------------------------------------------------  Cardiac Enzymes Recent Labs  Lab 07/29/18 1052  TROPONINI 0.06*   ------------------------------------------------------------------------------------------------------------------  RADIOLOGY:  Dg Chest 2 View  Result Date: 08/01/2018 CLINICAL DATA:  Hypoxia, shortness of breath. EXAM: CHEST - 2 VIEW COMPARISON:  Radiographs of July 29, 2018. FINDINGS: Stable cardiomegaly with  central pulmonary vascular congestion. Atherosclerosis of thoracic aorta is noted. No pneumothorax is noted. Mild bilateral pleural effusions are noted. Minimal pulmonary edema may be present. Bony thorax is unremarkable. IMPRESSION: Stable cardiomegaly with central pulmonary vascular congestion and possible minimal pulmonary edema. Mild bilateral pleural effusions are noted. Aortic Atherosclerosis (ICD10-I70.0). Electronically Signed   By: Marijo Conception, M.D.   On: 08/01/2018 15:51    ASSESSMENT AND PLAN:  *Acute on chronic respiratory failure  due to acute systolic congestive heart failure, COPD, and extreme morbid obesity Worsening noted yesterday evening Continue high flow oxygen with weaning as tolerated  *Acute on chronic systolic congestive heart failure sedation  Echocardiogram: LV EF: 30% - 35%.   Continue congestive heart failure protocol, cardiology input appreciated-to increase Lasix to 80 mg IV twice daily, BB, no ACE inhibitor or ARB is due to kidney function  *Acute on COPD exacerbation IV Solu-Medrol with tapering as tolerated, inhaled corticosteroids twice daily, mucolytic agents, respiratory therapy to see, CPAP at bedtime/as needed-respiratory therapy for settings  *Acute atrial flutter/ A fib,New onset.  Stable Continue BB, Eliquis, cardiology considering cardioversion 3 to 4 weeks after anticoagulation per Dr. Fletcher Anon.  *Chronic CAD Stable Continue Eliquis and Lipitor She is not a good candidate for further cardiac catheterization, continue medical treatment per Dr. Fletcher Anon.  *CKD stage III Stable Avoid nephrotoxic agents   *Chronic diabetes mellitus, type II Diet controlled Hemoglobin A1c is 6.2 Continue sliding scale insulin with Accu-Cheks  *Chronic morbid obesity Likely secondary to excess calories Lifestyle modification recommended  *Acute hypokalemia Repleted  *Anemia of chronic disease Stable  *Acute remain hypotension   Improved Continue Lasix/antihypertensive agents with hold parameters  Disposition to skilled nursing facility in 2 to 3 days barring any complications  All the records are reviewed and case discussed with Care Management/Social Workerr. Management plans discussed with the patient, family and they are in agreement.  CODE STATUS: full  TOTAL TIME TAKING CARE OF THIS PATIENT: 35 minutes.     POSSIBLE D/C IN 2-3 DAYS, DEPENDING ON CLINICAL CONDITION.   Tina [name] M.D on 08/02/2018   Between 7am to 6pm - Pager - [phone]  After 6pm go to [email or web address] - password [email or web address] [facility]  Office  [phone]  CC: Primary care physician; Patient, No Pcp Per  Note: This dictation was prepared with Dragon dictation along with smaller phrase technology. Any transcriptional errors that result from this process are unintentional.

## 2018-08-02 NOTE — Progress Notes (Signed)
[facility] NAME: Tina [name]    MR#:  [id number]  DATE OF BIRTH:  [birth year]  SUBJECTIVE:  Pt w/o complaint REVIEW OF SYSTEMS:  CONSTITUTIONAL: No fever, fatigue or weakness.  EYES: No blurred or double vision.  EARS, NOSE, AND THROAT: No tinnitus or ear pain.  RESPIRATORY: No cough, shortness of breath, wheezing or hemoptysis.  CARDIOVASCULAR: No chest pain, orthopnea, edema.  GASTROINTESTINAL: No nausea, vomiting, diarrhea or abdominal pain.  GENITOURINARY: No dysuria, hematuria.  ENDOCRINE: No polyuria, nocturia,  HEMATOLOGY: No anemia, easy bruising or bleeding SKIN: No rash or lesion. MUSCULOSKELETAL: No joint pain or arthritis.   NEUROLOGIC: No tingling, numbness, weakness.  PSYCHIATRY: No anxiety or depression.   ROS  DRUG ALLERGIES:   Allergies  Allergen Reactions  . Sulfamethoxazole-Trimethoprim Other (See Comments)    Uncoded Allergy. Allergen: iv contrast dye, Other Reaction: Brkthru reaction Hives/itching  . Aspirin Other (See Comments)  . Clarithromycin Other (See Comments)  . Codeine Other (See Comments)    Other Reaction: Not Assessed  . Penicillins Other (See Comments)    VITALS:  Blood pressure 106/78, pulse 98, temperature 98.3 F (36.8 C), temperature source Oral, resp. rate 20, height 5\' 7"  (1.702 m), weight 119.5 kg, SpO2 91 %.  PHYSICAL EXAMINATION:  GENERAL:  85 y.o.-year-old patient lying in the bed with no acute distress.  EYES: Pupils equal, round, reactive to light and accommodation. No scleral icterus. Extraocular muscles intact.  HEENT: Head atraumatic, normocephalic. Oropharynx and nasopharynx clear.  NECK:  Supple, no jugular venous distention. No thyroid enlargement, no tenderness.  LUNGS: Normal breath sounds bilaterally, no wheezing, rales,rhonchi or crepitation. No use of accessory muscles of respiration.  CARDIOVASCULAR: S1, S2 normal. No murmurs, rubs, or gallops.  ABDOMEN: Soft,  nontender, nondistended. Bowel sounds present. No organomegaly or mass.  EXTREMITIES: No pedal edema, cyanosis, or clubbing.  NEUROLOGIC: Cranial nerves II through XII are intact. Muscle strength 5/5 in all extremities. Sensation intact. Gait not checked.  PSYCHIATRIC: The patient is alert and oriented x 3.  SKIN: No obvious rash, lesion, or ulcer.   Physical Exam LABORATORY PANEL:   CBC Recent Labs  Lab 07/30/18 0653  WBC 9.5  HGB 10.3*  HCT 37.7  PLT 239   ------------------------------------------------------------------------------------------------------------------  Chemistries  Recent Labs  Lab 07/29/18 1057  07/31/18 0702  08/02/18 0728  NA  --    < > 139   < > 138  K  --    < > 3.4*   < > 4.0  CL  --    < > 99   < > 98  CO2  --    < > 33*   < > 32  GLUCOSE  --    < > 110*   < > 124*  BUN  --    < > 28*   < > 32*  CREATININE  --    < > 1.77*   < > 1.79*  CALCIUM  --    < > 8.4*   < > 8.7*  MG  --   --  2.2  --   --   AST 28  --   --   --   --   ALT 15  --   --   --   --   ALKPHOS 69  --   --   --   --   BILITOT 1.0  --   --   --   --    < > =  values in this interval not displayed.   ------------------------------------------------------------------------------------------------------------------  Cardiac Enzymes Recent Labs  Lab 07/29/18 1052  TROPONINI 0.06*   ------------------------------------------------------------------------------------------------------------------  RADIOLOGY:  Dg Chest 2 View  Result Date: 08/01/2018 CLINICAL DATA:  Hypoxia, shortness of breath. EXAM: CHEST - 2 VIEW COMPARISON:  Radiographs of July 29, 2018. FINDINGS: Stable cardiomegaly with central pulmonary vascular congestion. Atherosclerosis of thoracic aorta is noted. No pneumothorax is noted. Mild bilateral pleural effusions are noted. Minimal pulmonary edema may be present. Bony thorax is unremarkable. IMPRESSION: Stable cardiomegaly with central pulmonary vascular  congestion and possible minimal pulmonary edema. Mild bilateral pleural effusions are noted. Aortic Atherosclerosis (ICD10-I70.0). Electronically Signed   By: Marijo Conception, M.D.   On: 08/01/2018 15:51    ASSESSMENT AND PLAN:  *Acute on chronic respiratory failure due to acute systolic congestive heart failure.  Resolving  Continue supplemental oxygen wean as tolerated   *Acute on chronic systolic congestive heart failure sedation  Echocardiogram: LV EF: 30% - 35%.   Continue congestive heart failure protocol, cardiology input appreciated-to increase Lasix to 60 mg IV twice daily, beta-blocker therapy/Lopressor 12.5 mg every 6 hours if blood pressure will tolerate, no ACE inhibitor or ARB is due to kidney function.  *Acute atrial flutter/ A fib,New onset.  Stable Continue Lopressor, Eliquis, cardiology considering cardioversion 3 to 4 weeks after anticoagulation per Dr. Fletcher Anon.  *Chronic CAD Stable Continue Eliquis and Lipitor She is not a good candidate for further cardiac evaluation and continue medical treatment per Dr. Fletcher Anon.  *CKD stage III Stable Avoid nephrotoxic agents   *Chronic diabetes mellitus, type II Continue sliding scale insulin Accu-Cheks per routine  *COPD Stable continue oxygen  *Chronic morbid obesity Likely secondary to excess calories *Modification recommended  *Acute hypokalemia Repleted  *Anemia of chronic disease Stable  *Acute hypotension  Improved Hold Lasix/antihypertensive agents if needed   All the records are reviewed and case discussed with Care Management/Social Workerr. Management plans discussed with the patient, family and they are in agreement.  CODE STATUS:full  TOTAL TIME TAKING CARE OF THIS PATIENT: 35 minutes.     POSSIBLE D/C IN 1-3 DAYS, DEPENDING ON CLINICAL CONDITION.   Tina [name] M.D on 08/02/2018   Between 7am to 6pm - Pager - [phone]  After 6pm go to [email or web address] - password [email or web address]  [facility]  Office  [phone]  CC: Primary care physician; Patient, No Pcp Per  Note: This dictation was prepared with Dragon dictation along with smaller phrase technology. Any transcriptional errors that result from this process are unintentional.

## 2018-08-02 NOTE — NC FL2 (Signed)
[place] LEVEL OF CARE SCREENING TOOL     IDENTIFICATION  Patient Name: Tina [name] Birthdate: Dec 07, 1927 Sex: female Admission Date (Current Location): 07/29/2018  [place] and [place] Number:  Tina [name] [id number] [place] and Address:  [facility], [address] [place], [place] [address]      Provider Number: [id number]  Attending Physician Name and Address:  Gorden Harms, MD  Relative Name and Phone Number:  Dante Gang [phone]      Current Level of Care: Hospital Recommended Level of Care: [place] Prior Approval Number:    Date Approved/Denied:   PASRR Number: Pending  Discharge Plan: SNF    Current Diagnoses: Patient Active Problem List   Diagnosis Date Noted  . Acute on chronic respiratory failure with hypoxia ([place])   . Goals of care, counseling/discussion   . Palliative care encounter   . Acute on chronic systolic heart failure ([place])   . Atrial fibrillation with rapid ventricular response ([place])   . Healthcare-associated pneumonia 08/23/2016  . Hypotension 08/23/2016  . Leukocytosis 08/23/2016  . Hyponatremia 08/23/2016  . Generalized weakness 08/23/2016  . Leg swelling 08/23/2016  . Pressure ulcer 08/26/2015  . CHF (congestive heart failure) ([place]) 08/25/2015    Orientation RESPIRATION BLADDER Height & Weight     Self, Situation, Place  O2(5L) Incontinent Weight: 263 lb 8 oz (119.5 kg) Height:  5\' 7"  (170.2 cm)  BEHAVIORAL SYMPTOMS/MOOD NEUROLOGICAL BOWEL NUTRITION STATUS      Continent Diet(Fluid Restrictions Carb Modified)  AMBULATORY STATUS COMMUNICATION OF NEEDS Skin   Limited Assist Verbally Normal                       Personal Care Assistance Level of Assistance  Bathing, Dressing, Feeding Bathing Assistance: Limited assistance Feeding assistance: Independent Dressing Assistance: Limited assistance     Functional Limitations Info  Sight, Hearing, Speech  Sight Info: Adequate Hearing Info: Adequate Speech Info: Adequate    SPECIAL CARE FACTORS FREQUENCY  PT (By licensed PT), OT (By licensed OT)     PT Frequency: 5x a week OT Frequency: 5x a week            Contractures      Additional Factors Info  Psychotropic Code Status Info: Full Code Allergies Info: SULFAMETHOXAZOLE-TRIMETHOPRIM, ASPIRIN, CLARITHROMYCIN, CODEINE, PENICILLINS Psychotropic Info: buPROPion (WELLBUTRIN XL) 24 hr tablet 150 mg and sertraline (ZOLOFT) tablet 50 mg          Current Medications (08/02/2018):  This is the current hospital active medication list Current Facility-Administered Medications  Medication Dose Route Frequency Provider Last Rate Last Dose  . acetaminophen (TYLENOL) tablet 650 mg  650 mg Oral Q6H PRN Demetrios Loll, MD       Or  . acetaminophen (TYLENOL) suppository 650 mg  650 mg Rectal Q6H PRN Demetrios Loll, MD      . albuterol (PROVENTIL) (2.5 MG/3ML) 0.083% nebulizer solution 2.5 mg  2.5 mg Nebulization Q2H PRN Demetrios Loll, MD   2.5 mg at 08/01/18 0547  . alum & mag hydroxide-simeth (MAALOX/MYLANTA) 200-200-20 MG/5ML suspension 30 mL  30 mL Oral Q6H PRN Demetrios Loll, MD      . apixaban Arne [place]) tablet 2.5 mg  2.5 mg Oral BID Demetrios Loll, MD   2.5 mg at 08/02/18 0929  . atorvastatin (LIPITOR) tablet 40 mg  40 mg Oral q1800 Demetrios Loll, MD   40 mg at 08/01/18 2204  . buPROPion (WELLBUTRIN XL) 24 hr  tablet 150 mg  150 mg Oral Daily Demetrios Loll, MD   150 mg at 08/02/18 0929  . calcium carbonate (TUMS - dosed in mg elemental calcium) chewable tablet 200 mg of elemental calcium  1 tablet Oral BID PRN Demetrios Loll, MD      . cholecalciferol (VITAMIN D) tablet 2,000 Units  2,000 Units Oral Daily Demetrios Loll, MD   2,000 Units at 08/02/18 [phone]  . docusate sodium (COLACE) capsule 100 mg  100 mg Oral BID Demetrios Loll, MD   100 mg at 08/02/18 0930  . donepezil (ARICEPT) tablet 5 mg  5 mg Oral Daily Demetrios Loll, MD   5 mg at 08/02/18 0929  . ferrous sulfate tablet 325  mg  325 mg Oral Once per day on Mon Wed Fri Chen, Qing, MD   325 mg at 08/01/18 1411  . furosemide (LASIX) injection 60 mg  60 mg Intravenous BID End, Christopher, MD   60 mg at 08/02/18 1035  . ipratropium-albuterol (DUONEB) 0.5-2.5 (3) MG/3ML nebulizer solution 3 mL  3 mL Nebulization QID Salary, Montell D, MD   3 mL at 08/02/18 1110  . lactulose (CHRONULAC) 10 GM/15ML solution 20 g  20 g Oral Daily PRN Demetrios Loll, MD      . levothyroxine (SYNTHROID, LEVOTHROID) tablet 125 mcg  125 mcg Oral QAC breakfast Demetrios Loll, MD   125 mcg at 08/02/18 [phone]  . loperamide (IMODIUM) capsule 2 mg  2 mg Oral PRN Demetrios Loll, MD      . loratadine (CLARITIN) tablet 10 mg  10 mg Oral Daily Demetrios Loll, MD   10 mg at 08/02/18 0929  . LORazepam (ATIVAN) tablet 0.5 mg  0.5 mg Oral Q8H PRN Demetrios Loll, MD      . magnesium hydroxide (MILK OF MAGNESIA) suspension 30 mL  30 mL Oral QHS PRN Demetrios Loll, MD      . methylPREDNISolone sodium succinate (SOLU-MEDROL) 125 mg/2 mL injection 60 mg  60 mg Intravenous Q6H Salary, Montell D, MD   60 mg at 08/02/18 1400  . metoprolol tartrate (LOPRESSOR) tablet 25 mg  25 mg Oral Q6H End, Christopher, MD   25 mg at 08/02/18 1209  . mometasone-formoterol (DULERA) 200-5 MCG/ACT inhaler 2 puff  2 puff Inhalation BID Demetrios Loll, MD   2 puff at 08/02/18 5732  . nystatin (MYCOSTATIN/NYSTOP) topical powder 1 g  1 g Topical BID PRN Demetrios Loll, MD      . ondansetron [facility]) tablet 4 mg  4 mg Oral Q6H PRN Demetrios Loll, MD       Or  . ondansetron [facility]) injection 4 mg  4 mg Intravenous Q6H PRN Demetrios Loll, MD      . pantoprazole (PROTONIX) EC tablet 40 mg  40 mg Oral Daily Demetrios Loll, MD   40 mg at 08/02/18 0929  . polyethylene glycol (MIRALAX / GLYCOLAX) packet 17 g  17 g Oral Daily PRN Demetrios Loll, MD      . polyvinyl alcohol (LIQUIFILM TEARS) 1.4 % ophthalmic solution 1 drop  1 drop Both Eyes BID Demetrios Loll, MD   1 drop at 08/02/18 0929  . polyvinyl alcohol (LIQUIFILM TEARS) 1.4 % ophthalmic  solution 1 drop  1 drop Both Eyes TID Salary, Montell D, MD      . potassium chloride (K-DUR,KLOR-CON) CR tablet 30 mEq  30 mEq Oral Daily Demetrios Loll, MD   30 mEq at 08/02/18 0929  . risperiDONE (RISPERDAL) tablet 0.25 mg  0.25 mg Oral  QHS PRN Demetrios Loll, MD      . sertraline (ZOLOFT) tablet 50 mg  50 mg Oral Daily Demetrios Loll, MD   50 mg at 08/01/18 2144  . simethicone (MYLICON) chewable tablet 80 mg  80 mg Oral Q8H Demetrios Loll, MD   80 mg at 08/02/18 1359  . sodium chloride flush (NS) 0.9 % injection 3 mL  3 mL Intravenous Q12H Demetrios Loll, MD   3 mL at 08/02/18 0931  . umeclidinium bromide (INCRUSE ELLIPTA) 62.5 MCG/INH 1 puff  1 puff Inhalation Daily Demetrios Loll, MD   1 puff at 08/02/18 9373     Discharge Medications: Please see discharge summary for a list of discharge medications.  Relevant Imaging Results:  Relevant Lab Results:   Additional Information SSN [id number]  Ross Ludwig, [place]

## 2018-08-02 NOTE — Clinical Social Work Note (Signed)
CSW presented bed offers to patient's son Gwyndolyn Saxon [phone] and he chose [facility] [place].  CSW spoke to [facility] and they can accept patient once she is medically ready for discharge.  CSW to continue to facilitate discharge planning.  Jones Broom. Collins, MSW, [place]  08/02/2018 5:16 PM

## 2018-08-02 NOTE — Evaluation (Signed)
Physical Therapy Evaluation Patient Details Name: Tina [name] MRN: [id number] DOB: 1927/08/07 Today's Date: 08/02/2018   History of Present Illness  Pt is 85 yo female admitted for SOB, leg edema, exertional dyspnea, PMH of history of HFrEF (07/30/18 EF 30-35%), CAD/MI, DMII, COPD, CKD III, dementia, colon cancer, anemia, and obesity. New onset of afib this admission as well, cardiology following.     Clinical Impression  Patient oriented to self, situation, behavior WFLs during session, intermittent cues to attend to task needed. Pt with some confusion about PLOF, but family entered room to provide. Per family, pt able to transfer from bed to wheelchair without assistance, ambulated very short distances, lives at [facility], assistance needed for bathing, able to dress herself per family.  Pt BP at rest 94/61, HR 94, spO2 on 6L HFNC 99%. Upon assessment pt unable to move LLE without assistance and with pain, stated that her L leg is always her worse leg. With beginning of mobility with severe coughing, desatted to 80s, recovered with cues for PLB. Pt modAx1 from supine to sit and to scoot to EOB. Mild SOB noted, spO2 in mid80s, able to recover to low 90s with time, CGA verbal cues. Pt quickly fatigued, anxious, and with poor postural control at EOB, further mobility held. The patient demonstrated significant limitations in functional mobility compared to baseline and would benefit from skilled PT intervention. Recommendation is STR to maximize mobility, independence, and safety at this time.     Follow Up Recommendations SNF    Equipment Recommendations  Other (comment)(TBD at next venue of care)    Recommendations for Other Services       Precautions / Restrictions Precautions Precaution Comments: watch HR and O2 Restrictions Weight Bearing Restrictions: No      Mobility  Bed Mobility Overal bed mobility: Needs Assistance Bed Mobility: Supine to Sit;Sit to Supine     Supine  to sit: Mod assist Sit to supine: Mod assist;+2 for physical assistance   General bed mobility comments: heavy use of bed rails, HOB elevated.   Transfers                 General transfer comment: deferred. pt desatted EOB to mid 80s on HFNC at 6L, able to recover within several minutes and cues to attend to task/breathing technique. Pt fatigued quickly, UE needed.   Ambulation/Gait                Stairs            Wheelchair Mobility    Modified Rankin (Stroke Patients Only)       Balance Overall balance assessment: Needs assistance Sitting-balance support: Feet supported;Single extremity supported Sitting balance-Leahy Scale: Poor                                       Pertinent Vitals/Pain Pain Assessment: No/denies pain    Home Living Family/patient expects to be discharged to:: Assisted living                 Additional Comments: Lives at [facility] house    Prior Function Level of Independence: Needs assistance   Gait / Transfers Assistance Needed: Family at bedside to confirm. Pt able to independently transfer from bed to [facility] and propel self. Very short walks, does not like to recieve assistance  ADL's / Homemaking Assistance Needed: needs assistance with bathing, family states pt  is able to dress herself        Hand Dominance        Extremity/Trunk Assessment   Upper Extremity Assessment Upper Extremity Assessment: Generalized weakness    Lower Extremity Assessment Lower Extremity Assessment: Generalized weakness;LLE deficits/detail LLE Deficits / Details: Unable to perform SLR or heel slides independently       Communication   Communication: HOH  Cognition Arousal/Alertness: Awake/alert Behavior During Therapy: WFL for tasks assessed/performed Overall Cognitive Status: Within Functional Limits for tasks assessed                                 General Comments: oriented to self, location       General Comments      Exercises     Assessment/Plan    PT Assessment Patient needs continued PT services  PT Problem List Decreased strength;Cardiopulmonary status limiting activity;Decreased range of motion;Decreased activity tolerance;Decreased knowledge of use of DME;Decreased balance;Decreased safety awareness;Decreased mobility;Obesity       PT Treatment Interventions DME instruction;Therapeutic exercise;Gait training;Balance training;Stair training;Neuromuscular re-education;Functional mobility training;Therapeutic activities;Patient/family education    PT Goals (Current goals can be found in the Care Plan section)  Acute Rehab PT Goals Patient Stated Goal: Pt wants to breathe better PT Goal Formulation: With patient Time For Goal Achievement: 08/16/18 Potential to Achieve Goals: Fair    Frequency Min 2X/week   Barriers to discharge        Co-evaluation               AM-PAC PT "6 Clicks" Mobility  Outcome Measure Help needed turning from your back to your side while in a flat bed without using bedrails?: A Lot Help needed moving from lying on your back to sitting on the side of a flat bed without using bedrails?: A Lot Help needed moving to and from a bed to a chair (including a wheelchair)?: Total Help needed standing up from a chair using your arms (e.g., wheelchair or bedside chair)?: Total Help needed to walk in hospital room?: Total Help needed climbing 3-5 steps with a railing? : Total 6 Click Score: 8    End of Session Equipment Utilized During Treatment: Oxygen(6L HFNC) Activity Tolerance: Patient limited by fatigue;Treatment limited secondary to medical complications (Comment);Other (comment)(oxygen needs) Patient left: in bed;with nursing/sitter in room Nurse Communication: Mobility status PT Visit Diagnosis: Unsteadiness on feet (R26.81);Other abnormalities of gait and mobility (R26.89);Muscle weakness (generalized) (M62.81)    Time:  5188-4166 PT Time Calculation (min) (ACUTE ONLY): 39 min   Charges:   PT Evaluation $PT Eval High Complexity: 1 High PT Treatments $Therapeutic Activity: 23-37 mins       Lieutenant Diego PT, DPT 3:00 PM,08/02/18 [phone]

## 2018-08-03 LAB — BLOOD GAS, ARTERIAL
Acid-Base Excess: 8.8 mmol/L — ABNORMAL HIGH (ref 0.0–2.0)
Bicarbonate: 37 mmol/L — ABNORMAL HIGH (ref 20.0–28.0)
FIO2: 1
O2 Saturation: 88.8 %
PCO2 ART: 67 mmHg — AB (ref 32.0–48.0)
PH ART: 7.35 (ref 7.350–7.450)
Patient temperature: 37
pO2, Arterial: 59 mmHg — ABNORMAL LOW (ref 83.0–108.0)

## 2018-08-03 LAB — BASIC METABOLIC PANEL
ANION GAP: 7 (ref 5–15)
BUN: 35 mg/dL — ABNORMAL HIGH (ref 8–23)
CO2: 34 mmol/L — ABNORMAL HIGH (ref 22–32)
Calcium: 8.5 mg/dL — ABNORMAL LOW (ref 8.9–10.3)
Chloride: 98 mmol/L (ref 98–111)
Creatinine, Ser: 1.81 mg/dL — ABNORMAL HIGH (ref 0.44–1.00)
GFR calc Af Amer: 28 mL/min — ABNORMAL LOW (ref >60)
GFR calc non Af Amer: 25 mL/min — ABNORMAL LOW (ref >60)
Glucose, Bld: 139 mg/dL — ABNORMAL HIGH (ref 70–99)
POTASSIUM: 4.3 mmol/L (ref 3.5–5.1)
Sodium: 139 mmol/L (ref 135–145)

## 2018-08-03 MED ORDER — IPRATROPIUM-ALBUTEROL 0.5-2.5 (3) MG/3ML IN SOLN
3.0000 mL | Freq: Two times a day (BID) | RESPIRATORY_TRACT | Status: DC
  Administered 2018-08-03 – 2018-08-04 (×2): 3 mL via RESPIRATORY_TRACT
  Filled 2018-08-03 (×2): qty 3

## 2018-08-03 MED ORDER — METOPROLOL TARTRATE 50 MG PO TABS
50.0000 mg | ORAL_TABLET | Freq: Two times a day (BID) | ORAL | Status: DC
  Administered 2018-08-03 – 2018-08-07 (×9): 50 mg via ORAL
  Filled 2018-08-03 (×10): qty 1

## 2018-08-03 NOTE — Progress Notes (Addendum)
[place] [facility] [place] NAME: Tina [name]    MR#:  [id number]  DATE OF BIRTH:  31-May-1928  SUBJECTIVE:  No complaints, advanced directives as well as prognosis discussed with the patient at length with all questions answered, patient wishes to remain full code going forward despite lack of improvement with maximal therapy, cardiology input appreciated   REVIEW OF SYSTEMS:  CONSTITUTIONAL: No fever, fatigue or weakness.  EYES: No blurred or double vision.  EARS, NOSE, AND THROAT: No tinnitus or ear pain.  RESPIRATORY: No cough, shortness of breath, wheezing or hemoptysis.  CARDIOVASCULAR: No chest pain, orthopnea, edema.  GASTROINTESTINAL: No nausea, vomiting, diarrhea or abdominal pain.  GENITOURINARY: No dysuria, hematuria.  ENDOCRINE: No polyuria, nocturia,  HEMATOLOGY: No anemia, easy bruising or bleeding SKIN: No rash or lesion. MUSCULOSKELETAL: No joint pain or arthritis.   NEUROLOGIC: No tingling, numbness, weakness.  PSYCHIATRY: No anxiety or depression.   ROS  DRUG ALLERGIES:   Allergies  Allergen Reactions  . Sulfamethoxazole-Trimethoprim Other (See Comments)    Uncoded Allergy. Allergen: iv contrast dye, Other Reaction: Brkthru reaction Hives/itching  . Aspirin Other (See Comments)  . Clarithromycin Other (See Comments)  . Codeine Other (See Comments)    Other Reaction: Not Assessed  . Penicillins Other (See Comments)    VITALS:  Blood pressure 106/70, pulse 90, temperature (!) 97.5 F (36.4 C), temperature source Oral, resp. rate 20, height 5\' 7"  (1.702 m), weight 118.7 kg, SpO2 96 %.  PHYSICAL EXAMINATION:  GENERAL:  85 y.o.-year-old patient lying in the bed with no acute distress.  EYES: Pupils equal, round, reactive to light and accommodation. No scleral icterus. Extraocular muscles intact.  HEENT: Head atraumatic, normocephalic. Oropharynx and nasopharynx clear.  NECK:  Supple, no jugular venous distention. No  thyroid enlargement, no tenderness.  LUNGS: Normal breath sounds bilaterally, no wheezing, rales,rhonchi or crepitation. No use of accessory muscles of respiration.  CARDIOVASCULAR: S1, S2 normal. No murmurs, rubs, or gallops.  ABDOMEN: Soft, nontender, nondistended. Bowel sounds present. No organomegaly or mass.  EXTREMITIES: No pedal edema, cyanosis, or clubbing.  NEUROLOGIC: Cranial nerves II through XII are intact. Muscle strength 5/5 in all extremities. Sensation intact. Gait not checked.  PSYCHIATRIC: The patient is alert and oriented x 3.  SKIN: No obvious rash, lesion, or ulcer.   Physical Exam LABORATORY PANEL:   CBC Recent Labs  Lab 07/30/18 0653  WBC 9.5  HGB 10.3*  HCT 37.7  PLT 239   ------------------------------------------------------------------------------------------------------------------  Chemistries  Recent Labs  Lab 07/29/18 1057  07/31/18 0702  08/03/18 0430  NA  --    < > 139   < > 139  K  --    < > 3.4*   < > 4.3  CL  --    < > 99   < > 98  CO2  --    < > 33*   < > 34*  GLUCOSE  --    < > 110*   < > 139*  BUN  --    < > 28*   < > 35*  CREATININE  --    < > 1.77*   < > 1.81*  CALCIUM  --    < > 8.4*   < > 8.5*  MG  --   --  2.2  --   --   AST 28  --   --   --   --   ALT 15  --   --   --   --  ALKPHOS 69  --   --   --   --   BILITOT 1.0  --   --   --   --    < > = values in this interval not displayed.   ------------------------------------------------------------------------------------------------------------------  Cardiac Enzymes Recent Labs  Lab 07/29/18 1052  TROPONINI 0.06*   ------------------------------------------------------------------------------------------------------------------  RADIOLOGY:  Dg Chest 2 View  Result Date: 08/01/2018 CLINICAL DATA:  Hypoxia, shortness of breath. EXAM: CHEST - 2 VIEW COMPARISON:  Radiographs of July 29, 2018. FINDINGS: Stable cardiomegaly with central pulmonary vascular congestion.  Atherosclerosis of thoracic aorta is noted. No pneumothorax is noted. Mild bilateral pleural effusions are noted. Minimal pulmonary edema may be present. Bony thorax is unremarkable. IMPRESSION: Stable cardiomegaly with central pulmonary vascular congestion and possible minimal pulmonary edema. Mild bilateral pleural effusions are noted. Aortic Atherosclerosis (ICD10-I70.0). Electronically Signed   By: Marijo Conception, M.D.   On: 08/01/2018 15:51    ASSESSMENT AND PLAN:  *Acute on chronic respiratory failure  Stable Continues to require high flow oxygen  Due to acute systolic CHF, COPD, and extreme morbid obesity Continue supplemental oxygen with weaning as tolerated   *Acute on chronic systolic congestive heart failure sedation  Stable Echocardiogram: LV EF: 30% - 35%.   Continue CHF protocol, cardiology input appreciated -ischemic work-up deferred at this time, continue IV lasix 60 mg BID, BB, no ACE inhibitor or ARB is due to kidney function Note: Due to numerous episodes of hypotension-frequently has to miss doses diuretics as well as beta-blocker therapy; yet still has anasarca, coupled with advanced age/extreme morbid obesity her prognosis is quite poor  *Acute on COPD exacerbation Stable Continue IV Solu-Medrol with tapering as tolerated, inhaled corticosteroids twice daily, mucolytic agents, RT following, CPAP at bedtime/as needed  *Acute atrial flutter/ A fib,New onset.  Stable Continue BB, Eliquis, cardiology considering cardioversion 3 to 4 weeks after anticoagulation per Dr. Fletcher Anon.  *Chronic CAD Stable Continue  Lopressor if blood pressure will tolerate, Eliquis, and Lipitor She is not a good candidate for further cardiac catheterization, continue medical treatment per Dr. Fletcher Anon.  *CKD stage III Stable Avoid nephrotoxic agents   *Chronic diabetes mellitus, type II Diet controlled Hemoglobin A1c is 6.2 Continue sliding scale insulin with Accu-Cheks  *Chronic  morbid obesity Likely secondary to excess calories Lifestyle modification recommended  *Acute hypokalemia Repleted  *Anemia of chronic disease Stable  *Acute remain hypotension  Improved Continue Lasix/antihypertensive agents with hold parameters  Disposition to skilled nursing facility in 1-3 days barring any complications Prognosis is poor-palliative care consulted, decision making complicated by poor patient insight  All the records are reviewed and case discussed with Care Management/Social Workerr. Management plans discussed with the patient, family and they are in agreement.  CODE STATUS: full  TOTAL TIME TAKING CARE OF THIS PATIENT: 35 minutes.     POSSIBLE D/C IN 2-3 DAYS, DEPENDING ON CLINICAL CONDITION.   Avel Peace  M.D on 08/03/2018   Between 7am to 6pm - Pager - [phone]  After 6pm go to [email or web address] - password [email or web address] [facility]  Office  [phone]  CC: Primary care physician; Patient, No Pcp Per  Note: This dictation was prepared with Dragon dictation along with smaller phrase technology. Any transcriptional errors that result from this process are unintentional.

## 2018-08-03 NOTE — Clinical Social Work Placement (Signed)
   CLINICAL SOCIAL WORK PLACEMENT  NOTE  Date:  08/03/2018  Patient Details  Name: Tina [name] MRN: [id number] Date of Birth: 04-May-1928  Clinical Social Work is seeking post-discharge placement for this patient at the [place] level of care (*CSW will initial, date and re-position this form in  chart as items are completed):  Yes   Patient/family provided with [facility] Work Department's list of facilities offering this level of care within the geographic area requested by the patient (or if unable, by the patient's family).  Yes   Patient/family informed of their freedom to choose among providers that offer the needed level of care, that participate in Medicare, Medicaid or managed care program needed by the patient, have an available bed and are willing to accept the patient.  Yes   Patient/family informed of [facility]'s ownership interest in [facility] and [facility], as well as of the fact that they are under no obligation to receive care at these facilities.  PASRR submitted to EDS on 08/02/18     PASRR number received on 08/03/18     Existing PASRR number confirmed on       FL2 transmitted to all facilities in geographic area requested by pt/family on 08/02/18     FL2 transmitted to all facilities within larger geographic area on       Patient informed that his/her managed care company has contracts with or will negotiate with certain facilities, including the following:        Yes   Patient/family informed of bed offers received.  Patient [facility] bed at [facility]     Physician recommends and patient chooses bed at      Patient to be transferred to [facility] on  .  Patient to be transferred to facility by       Patient family notified on   of transfer.  Name of family member notified:        PHYSICIAN Please sign FL2, Please prepare prescriptions     Additional Comment:     _______________________________________________ Jacorie Ernsberger, Veronia Beets, LCSW 08/03/2018, 2:40 PM

## 2018-08-03 NOTE — Plan of Care (Signed)
  Problem: Nutrition: Goal: Adequate nutrition will be maintained Outcome: Progressing   Problem: Coping: Goal: Level of anxiety will decrease Outcome: Progressing Note:  Very pleasant overnight this shift   Problem: Elimination: Goal: Will not experience complications related to urinary retention Outcome: Progressing   Problem: Pain Managment: Goal: General experience of comfort will improve Outcome: Progressing Note:  No complaints of pain   Problem: Safety: Goal: Ability to remain free from injury will improve Outcome: Progressing   Problem: Clinical Measurements: Goal: Respiratory complications will improve Outcome: Not Progressing Note:  Continues to stay on hiflow nasal cannula, unable to wean. But now on 4.5L. continues to take it off and desat as low as in the 60's

## 2018-08-03 NOTE — Progress Notes (Signed)
Progress Note  Patient Name: Tina [name] Date of Encounter: 08/03/2018  Primary Cardiologist: Fletcher Anon  Subjective   Feels like her shortness of breath is slightly worse today when compared to yesterday.  She is quite confused this morning and does not believe she is in the hospital.  Remains on supplemental oxygen via nasal cannula at 4 L.  No chest pain.  No palpitations.  Labs show a BUN trending from 32-35, serum creatinine 1.79 trending to 1.81.  Remains on IV Lasix 60 mg twice daily.  Blood pressure improved to the 1 teens systolic.  Inpatient Medications    Scheduled Meds: . apixaban  2.5 mg Oral BID  . atorvastatin  40 mg Oral q1800  . buPROPion  150 mg Oral Daily  . cholecalciferol  2,000 Units Oral Daily  . docusate sodium  100 mg Oral BID  . donepezil  5 mg Oral Daily  . ferrous sulfate  325 mg Oral Once per day on Mon Wed Fri  . furosemide  60 mg Intravenous BID  . ipratropium-albuterol  3 mL Nebulization QID  . levothyroxine  125 mcg Oral QAC breakfast  . loratadine  10 mg Oral Daily  . methylPREDNISolone (SOLU-MEDROL) injection  60 mg Intravenous Q6H  . metoprolol tartrate  25 mg Oral Q6H  . mometasone-formoterol  2 puff Inhalation BID  . pantoprazole  40 mg Oral Daily  . polyvinyl alcohol  1 drop Both Eyes BID  . polyvinyl alcohol  1 drop Both Eyes TID  . potassium chloride  30 mEq Oral Daily  . sertraline  50 mg Oral Daily  . simethicone  80 mg Oral Q8H  . sodium chloride flush  3 mL Intravenous Q12H  . umeclidinium bromide  1 puff Inhalation Daily   Continuous Infusions:  PRN Meds: acetaminophen **OR** acetaminophen, albuterol, alum & mag hydroxide-simeth, calcium carbonate, lactulose, loperamide, LORazepam, magnesium hydroxide, nystatin, ondansetron **OR** ondansetron (ZOFRAN) IV, polyethylene glycol, risperiDONE   Vital Signs    Vitals:   08/02/18 1926 08/02/18 2006 08/02/18 2324 08/03/18 0448  BP: 114/77  114/69 118/67  Pulse: 89  98 89  Resp:  (!) 24   (!) 24  Temp: 97.7 F (36.5 C)   98.4 F (36.9 C)  TempSrc: Oral     SpO2: 95% 95% 94% 90%  Weight:    118.7 kg  Height:        Intake/Output Summary (Last 24 hours) at 08/03/2018 0751 Last data filed at 08/03/2018 0128 Gross per 24 hour  Intake 490 ml  Output 1200 ml  Net -710 ml   Filed Weights   08/01/18 0303 08/02/18 0308 08/03/18 0448  Weight: 117.4 kg 119.5 kg 118.7 kg    Telemetry    A. fib, 80s to 100 bpm- Personally Reviewed  ECG    n/a - Personally Reviewed  Physical Exam   GEN: No acute distress.   Neck: JVD elevated to the angle of the mandible. Cardiac:  Irregularly irregular, no murmurs, rubs, or gallops.  Respiratory:  Slightly diminished breath sounds bilaterally.  GI: Soft, nontender, non-distended.   MS:  1+ bilateral lower extremity pitting edema; No deformity. Neuro:  Alert and oriented x 3; Nonfocal.  Psych: Normal affect.  Labs    Chemistry Recent Labs  Lab 07/29/18 1057  08/01/18 1028 08/02/18 0728 08/03/18 0430  NA  --    < > 138 138 139  K  --    < > 4.0 4.0 4.3  CL  --    < >  99 98 98  CO2  --    < > 31 32 34*  GLUCOSE  --    < > 106* 124* 139*  BUN  --    < > 28* 32* 35*  CREATININE  --    < > 1.75* 1.79* 1.81*  CALCIUM  --    < > 8.7* 8.7* 8.5*  PROT 6.3*  --   --   --   --   ALBUMIN 3.3*  --   --   --   --   AST 28  --   --   --   --   ALT 15  --   --   --   --   ALKPHOS 69  --   --   --   --   BILITOT 1.0  --   --   --   --   GFRNONAA  --    < > 26* 25* 25*  GFRAA  --    < > 30* 29* 28*  ANIONGAP  --    < > 8 8 7    < > = values in this interval not displayed.     Hematology Recent Labs  Lab 07/29/18 1052 07/30/18 0653  WBC 8.0 9.5  RBC 4.49 4.24  HGB 10.9* 10.3*  HCT 39.6 37.7  MCV 88.2 88.9  MCH 24.3* 24.3*  MCHC 27.5* 27.3*  RDW 17.7* 17.7*  PLT 242 239    Cardiac Enzymes Recent Labs  Lab 07/29/18 1052  TROPONINI 0.06*   No results for input(s): TROPIPOC in the last 168 hours.    BNP Recent Labs  Lab 07/29/18 1057  BNP 325.0*     DDimer No results for input(s): DDIMER in the last 168 hours.   Radiology    Dg Chest 2 View  Result Date: 08/01/2018 IMPRESSION: Stable cardiomegaly with central pulmonary vascular congestion and possible minimal pulmonary edema. Mild bilateral pleural effusions are noted. Aortic Atherosclerosis (ICD10-I70.0). Electronically Signed   By: Marijo Conception, M.D.   On: 08/01/2018 15:51    Cardiac Studies   07/30/2018 TTE - Procedure narrative: The only obtainable view was parasternal. - Left ventricle: The cavity size was mildly dilated. There was mild concentric hypertrophy. Systolic function was moderately to severely reduced. The estimated ejection fraction was in the range of 30% to 35%. Severe hypokinesis of the anteroseptal and anterior myocardium. The study is not technically sufficient to allow evaluation of LV diastolic function. - Mitral valve: Calcified annulus. - Right ventricle: The cavity size was mildly dilated. Wall thickness was normal. Systolic function was mildly reduced.  Patient Profile     85 y.o. female with history of CAD, HFrEF, CKD stage III, DM2, dementia, colon cancer, anemia of chronic disease, and obesity who is being seen for the evaluation of A. fib, CHF, and elevated troponin.  Assessment & Plan    1.  Acute on chronic HFrEF: -She remains significantly volume overloaded -Continue IV Lasix 60 mg twice daily with KCl repletion as indicated -Continue Lopressor -Not currently on ACE inhibitor/ARB/spironolactone/Entresto secondary to hypotension and AKI -Resume evidence-based heart failure therapy as vital signs and renal function allow -Daily weights with strict I's and O's  2.  Elevated troponin/CAD: -No reported chest pain -Felt to be supply demand ischemia in the setting of volume overload and A. fib with RVR -Not currently a candidate for invasive ischemic evaluation given AKI  on CKD and dementia -We will need to revisit ischemic evaluation  and outpatient follow-up -On Eliquis in place of aspirin -Continue Lipitor and Lopressor  3.  Presumably new onset A. fib with RVR: -Remains in A. fib with reasonably controlled ventricular rates -Consolidate Lopressor to 50 mg twice daily given improvement in ventricular rates -Continue Eliquis 2.5 mg daily given age and renal function -As long as her ventricular rates remained well controlled, we will defer TEE guided cardioversion with plans to pursue rhythm control and outpatient follow-up once her volume status is improved  4.  Acute on CKD stage III: -BUN is trending upwards with a relatively stable serum creatinine -Continue IV Lasix as above with close monitoring of renal function  5.  Acute on chronic respiratory failure with hypoxia:  -Multifactorial including acute on chronic systolic CHF, COPD, morbid obesity with possible OHS -Continue supplemental oxygen, wean to baseline as tolerated  6. COPD: -Per IM  For questions or updates, please contact [facility] Please consult [email or web address] for contact info under Cardiology/STEMI.    Signed, Christell Faith, PA-C [facility] Pager: [phone] 08/03/2018, 7:51 AM

## 2018-08-03 NOTE — Care Management Important Message (Signed)
Copy of signed Medicare IM left with patient in room. 

## 2018-08-03 NOTE — Progress Notes (Signed)
Apixaban Education  Patient is taking Apixaban 2.5 mg PO BID. Discussed indication, how to take, side effects, concomitant OTC medications. Patient did not understand well despite multiple attempts to discuss. She said she "lived here now." Gave her the handout and discussed multiple times.  Paticia Stack, PharmD Pharmacy Resident  08/03/2018 1:38 PM

## 2018-08-03 NOTE — Progress Notes (Signed)
PASARR has been received. Plan is for patient to D/C to [facility] when medically stable. [facility] liaison is aware of above.   [employer], LCSW [phone]

## 2018-08-04 DIAGNOSIS — I4891 Unspecified atrial fibrillation: Secondary | ICD-10-CM

## 2018-08-04 MED ORDER — METHYLPREDNISOLONE SODIUM SUCC 125 MG IJ SOLR
60.0000 mg | Freq: Every day | INTRAMUSCULAR | Status: DC
  Administered 2018-08-05: 60 mg via INTRAVENOUS
  Filled 2018-08-04: qty 2

## 2018-08-04 MED ORDER — LORAZEPAM 2 MG/ML IJ SOLN
INTRAMUSCULAR | Status: AC
  Administered 2018-08-04: 1 mg via INTRAVENOUS
  Filled 2018-08-04: qty 1

## 2018-08-04 MED ORDER — HALOPERIDOL LACTATE 5 MG/ML IJ SOLN
2.0000 mg | Freq: Once | INTRAMUSCULAR | Status: AC
  Administered 2018-08-04: 2 mg via INTRAVENOUS

## 2018-08-04 MED ORDER — IPRATROPIUM-ALBUTEROL 0.5-2.5 (3) MG/3ML IN SOLN
3.0000 mL | Freq: Four times a day (QID) | RESPIRATORY_TRACT | Status: DC
  Administered 2018-08-04 – 2018-08-07 (×11): 3 mL via RESPIRATORY_TRACT
  Filled 2018-08-04 (×10): qty 3

## 2018-08-04 MED ORDER — HALOPERIDOL LACTATE 5 MG/ML IJ SOLN: 2.0000 mg | Freq: Four times a day (QID) | INTRAMUSCULAR | Status: DC | PRN

## 2018-08-04 MED ORDER — LORAZEPAM 2 MG/ML IJ SOLN
1.0000 mg | Freq: Once | INTRAMUSCULAR | Status: AC
  Administered 2018-08-04: 1 mg via INTRAVENOUS

## 2018-08-04 MED ORDER — HALOPERIDOL LACTATE 5 MG/ML IJ SOLN
INTRAMUSCULAR | Status: AC
  Filled 2018-08-04: qty 1

## 2018-08-04 NOTE — Progress Notes (Signed)
Progress Note   Subjective   Doing well today, the patient denies CP or SOB.  She is very confused and trying to get out of bed currently.  No new concerns  Inpatient Medications    Scheduled Meds: . apixaban  2.5 mg Oral BID  . atorvastatin  40 mg Oral q1800  . buPROPion  150 mg Oral Daily  . cholecalciferol  2,000 Units Oral Daily  . docusate sodium  100 mg Oral BID  . donepezil  5 mg Oral Daily  . ferrous sulfate  325 mg Oral Once per day on Mon Wed Fri  . furosemide  60 mg Intravenous BID  . ipratropium-albuterol  3 mL Nebulization QID  . levothyroxine  125 mcg Oral QAC breakfast  . loratadine  10 mg Oral Daily  . [START ON 08/05/2018] methylPREDNISolone (SOLU-MEDROL) injection  60 mg Intravenous Q breakfast  . metoprolol tartrate  50 mg Oral BID  . mometasone-formoterol  2 puff Inhalation BID  . pantoprazole  40 mg Oral Daily  . polyvinyl alcohol  1 drop Both Eyes TID  . potassium chloride  30 mEq Oral Daily  . sertraline  50 mg Oral Daily  . simethicone  80 mg Oral Q8H  . sodium chloride flush  3 mL Intravenous Q12H  . umeclidinium bromide  1 puff Inhalation Daily   Continuous Infusions:  PRN Meds: acetaminophen **OR** acetaminophen, albuterol, alum & mag hydroxide-simeth, calcium carbonate, lactulose, loperamide, LORazepam, magnesium hydroxide, nystatin, ondansetron **OR** ondansetron (ZOFRAN) IV, polyethylene glycol, risperiDONE   Vital Signs    Vitals:   08/04/18 0930 08/04/18 0937 08/04/18 0950 08/04/18 0959  BP: 109/86 109/86    Pulse:  91    Resp:      Temp:      TempSrc:      SpO2:   90% 97%  Weight:      Height:        Intake/Output Summary (Last 24 hours) at 08/04/2018 1150 Last data filed at 08/04/2018 1047 Gross per 24 hour  Intake 3 ml  Output 1000 ml  Net -997 ml   Filed Weights   08/02/18 0308 08/03/18 0448 08/04/18 0626  Weight: 119.5 kg 118.7 kg 119 kg    Telemetry    Atrial flutter with elevated V rates noted - Personally  Reviewed  Physical Exam   GEN- The patient is chronically ill appearing, alert but confused Head- normocephalic, atraumatic Eyes-  Sclera clear, conjunctiva pink Ears- hearing intact Oropharynx- clear Neck- supple, Lungs- Clear to ausculation bilaterally, normal work of breathing Heart- tachycardic irregular rhythm  GI- soft, NT, ND, + BS Extremities- no clubbing, cyanosis,+ edema  MS- no significant deformity or atrophy Skin- no rash or lesion Psych- euthymic mood, full affect Neuro- strength and sensation are intact   Labs    Chemistry Recent Labs  Lab 07/29/18 1057  08/01/18 1028 08/02/18 0728 08/03/18 0430  NA  --    < > 138 138 139  K  --    < > 4.0 4.0 4.3  CL  --    < > 99 98 98  CO2  --    < > 31 32 34*  GLUCOSE  --    < > 106* 124* 139*  BUN  --    < > 28* 32* 35*  CREATININE  --    < > 1.75* 1.79* 1.81*  CALCIUM  --    < > 8.7* 8.7* 8.5*  PROT 6.3*  --   --   --   --  ALBUMIN 3.3*  --   --   --   --   AST 28  --   --   --   --   ALT 15  --   --   --   --   ALKPHOS 69  --   --   --   --   BILITOT 1.0  --   --   --   --   GFRNONAA  --    < > 26* 25* 25*  GFRAA  --    < > 30* 29* 28*  ANIONGAP  --    < > 8 8 7    < > = values in this interval not displayed.     Hematology Recent Labs  Lab 07/29/18 1052 07/30/18 0653  WBC 8.0 9.5  RBC 4.49 4.24  HGB 10.9* 10.3*  HCT 39.6 37.7  MCV 88.2 88.9  MCH 24.3* 24.3*  MCHC 27.5* 27.3*  RDW 17.7* 17.7*  PLT 242 239    Cardiac Enzymes Recent Labs  Lab 07/29/18 1052  TROPONINI 0.06*   No results for input(s): TROPIPOC in the last 168 hours.      Patient Profile     85 y.o. female with history of CAD, HFrEF, CKD stage III, DM2, dementia, colon cancer, anemia of chronic disease, and obesity who is being seen for the evaluation of A. fib, CHF, and elevated troponin.  Assessment & Plan    1.  Acute on chronic systolic dysfunction Remains volume overloaded Continue diuresis as tolerated over the  weekend  2. CAD/ elevated troponin Given advanced age, a conservative approach is advised Likely demand ischemia secondary to AF with RVR  3. Persistent afib with RVR Continue rate control with metoprolol Continue eliquis Could consider cardioversion electively once more stable  4. Acute on chronic stage III renal failure Stable No change required today  5. Morbid obesity Body mass index is 41.09 kg/m. Limits our ability to treat  Continue gentle diuresis over the weekend Cardiology to see again on Monday  Thompson Grayer MD, [facility] 08/04/2018 11:50 AM

## 2018-08-04 NOTE — Progress Notes (Addendum)
[facility] at [place] NAME: Tina [name]    MR#:  [id number]  DATE OF BIRTH:  1928/06/22  SUBJECTIVE:   Patient still has not completely woken up from her sleep.  Able to answer questions.  But falls back to sleep. Received 1 g IV Ativan at 2 AM. Placed on nonrebreather overnight due to mouth breathing. Afebrile.  REVIEW OF SYSTEMS:  CONSTITUTIONAL: No fever, fatigue or weakness.  EYES: No blurred or double vision.  EARS, NOSE, AND THROAT: No tinnitus or ear pain.  RESPIRATORY: No cough, shortness of breath, wheezing or hemoptysis.  CARDIOVASCULAR: No chest pain, orthopnea, edema.  GASTROINTESTINAL: No nausea, vomiting, diarrhea or abdominal pain.  GENITOURINARY: No dysuria, hematuria.  ENDOCRINE: No polyuria, nocturia,  HEMATOLOGY: No anemia, easy bruising or bleeding SKIN: No rash or lesion. MUSCULOSKELETAL: No joint pain or arthritis.   NEUROLOGIC: No tingling, numbness, weakness.  PSYCHIATRY: No anxiety or depression.   ROS  DRUG ALLERGIES:   Allergies  Allergen Reactions  . Sulfamethoxazole-Trimethoprim Other (See Comments)    Uncoded Allergy. Allergen: iv contrast dye, Other Reaction: Brkthru reaction Hives/itching  . Aspirin Other (See Comments)  . Clarithromycin Other (See Comments)  . Codeine Other (See Comments)    Other Reaction: Not Assessed  . Penicillins Other (See Comments)    VITALS:  Blood pressure 109/86, pulse 91, temperature 98.1 F (36.7 C), temperature source Oral, resp. rate 20, height 5\' 7"  (1.702 m), weight 119 kg, SpO2 97 %.  PHYSICAL EXAMINATION:  GENERAL:  85 y.o.-year-old patient lying in the bed with no acute distress.  Obese EYES: Pupils equal, round, reactive to light and accommodation. No scleral icterus. Extraocular muscles intact.  HEENT: Head atraumatic, normocephalic. Oropharynx and nasopharynx clear.  NECK:  Supple, no jugular venous distention. No thyroid enlargement, no tenderness.   LUNGS: Creased air entry with bilateral wheezing CARDIOVASCULAR: S1, S2 normal. No murmurs, rubs, or gallops.  ABDOMEN: Soft, nontender, nondistended. Bowel sounds present. No organomegaly or mass.  EXTREMITIES: No pedal edema, cyanosis, or clubbing.  NEUROLOGIC: Cranial nerves II through XII are intact. Muscle strength 5/5 in all extremities. Sensation intact. Gait not checked.  PSYCHIATRIC: The patient is drowsy.  Wakes up on calling her name SKIN: No obvious rash, lesion, or ulcer.   Physical Exam LABORATORY PANEL:   CBC Recent Labs  Lab 07/30/18 0653  WBC 9.5  HGB 10.3*  HCT 37.7  PLT 239   ------------------------------------------------------------------------------------------------------------------  Chemistries  Recent Labs  Lab 07/29/18 1057  07/31/18 0702  08/03/18 0430  NA  --    < > 139   < > 139  K  --    < > 3.4*   < > 4.3  CL  --    < > 99   < > 98  CO2  --    < > 33*   < > 34*  GLUCOSE  --    < > 110*   < > 139*  BUN  --    < > 28*   < > 35*  CREATININE  --    < > 1.77*   < > 1.81*  CALCIUM  --    < > 8.4*   < > 8.5*  MG  --   --  2.2  --   --   AST 28  --   --   --   --   ALT 15  --   --   --   --  ALKPHOS 69  --   --   --   --   BILITOT 1.0  --   --   --   --    < > = values in this interval not displayed.   ------------------------------------------------------------------------------------------------------------------  Cardiac Enzymes Recent Labs  Lab 07/29/18 1052  TROPONINI 0.06*   ------------------------------------------------------------------------------------------------------------------  RADIOLOGY:  No results found.  ASSESSMENT AND PLAN:  *Acute on chronic respiratory failure  Stable Continues to require high flow oxygen  Due to acute systolic CHF, COPD, and extreme morbid obesity Continue supplemental oxygen with weaning as tolerated   *Acute on chronic systolic congestive heart failure sedation  Echocardiogram: LV  EF: 30% - 35%.   Continue IV Lasix for further diuresis.  Seems fluid overloaded.  Appreciate cardiology following.   *Acute COPD exacerbation Continue IV Solu-Medrol with tapering as tolerated, inhaled corticosteroids twice daily, mucolytic agents, RT following, CPAP at bedtime/as needed DuoNeb made scheduled every 6 hours.  *Acute atrial flutter/ A fib,New onset.  Stable Continue BB, Eliquis, cardiology considering cardioversion 3 to 4 weeks after anticoagulation per Dr. Fletcher Anon.  *Chronic CAD Stable Continue  Lopressor if blood pressure will tolerate, Eliquis, and Lipitor She is not a good candidate for further cardiac catheterization, continue medical treatment per Dr. Fletcher Anon.  *CKD stage III Stable Avoid nephrotoxic agents   *Chronic diabetes mellitus, type II Hemoglobin A1c is 6.2 Continue sliding scale insulin with Accu-Cheks  *Chronic morbid obesity  *Acute hypokalemia Repleted called  *Anemia of chronic disease Stable  Disposition to skilled nursing facility in 2-3 days barring any complications Prognosis is poor-palliative care consulted, decision making complicated by poor patient insight  All the records are reviewed and case discussed with Care Management/Social Workerr. Management plans discussed with the patient, family and they are in agreement.  CODE STATUS: Full  TOTAL TIME TAKING CARE OF THIS PATIENT: 35 minutes.   POSSIBLE D/C IN 2-3 DAYS, DEPENDING ON CLINICAL CONDITION.   Neita Carp M.D on 08/04/2018   Between 7am to 6pm - Pager - [phone]  After 6pm go to [email or web address] - password [email or web address] [facility]  Office  [phone]  CC: Primary care physician; Patient, No Pcp Per  Note: This dictation was prepared with Dragon dictation along with smaller phrase technology. Any transcriptional errors that result from this process are unintentional.

## 2018-08-04 NOTE — Progress Notes (Signed)
Patient found with HFNC on floor, SAT at 47%. Patient immediately placed back on oxygen and RN notified. Breathing treatment given by O2 tank while HFNC running at 15L. Patient SAT increased to 94%. Patient resting comfortably in bed now, normal respirations, diminished bilateral breath sounds. Rn at bedside.

## 2018-08-04 NOTE — Progress Notes (Signed)
Patient noted to be a mouth breather. Attempted to place on 5L [place] and O2 sat went down to 90%.  Dr. Darvin Neighbours at bedside with this RN and agreeable to put patient back on non-rebreather until wakes up more and then will reattempt .

## 2018-08-05 LAB — CBC
HCT: 43.1 % (ref 36.0–46.0)
Hemoglobin: 11.4 g/dL — ABNORMAL LOW (ref 12.0–15.0)
MCH: 23.8 pg — ABNORMAL LOW (ref 26.0–34.0)
MCHC: 26.5 g/dL — ABNORMAL LOW (ref 30.0–36.0)
MCV: 89.8 fL (ref 80.0–100.0)
Platelets: 246 10*3/uL (ref 150–400)
RBC: 4.8 MIL/uL (ref 3.87–5.11)
RDW: 17.8 % — ABNORMAL HIGH (ref 11.5–15.5)
WBC: 10.4 10*3/uL (ref 4.0–10.5)
nRBC: 0 % (ref 0.0–0.2)

## 2018-08-05 LAB — BASIC METABOLIC PANEL
Anion gap: 7 (ref 5–15)
BUN: 59 mg/dL — ABNORMAL HIGH (ref 8–23)
CO2: 35 mmol/L — ABNORMAL HIGH (ref 22–32)
Calcium: 8.9 mg/dL (ref 8.9–10.3)
Chloride: 99 mmol/L (ref 98–111)
Creatinine, Ser: 2.05 mg/dL — ABNORMAL HIGH (ref 0.44–1.00)
GFR calc Af Amer: 24 mL/min — ABNORMAL LOW (ref >60)
GFR calc non Af Amer: 21 mL/min — ABNORMAL LOW (ref >60)
Glucose, Bld: 116 mg/dL — ABNORMAL HIGH (ref 70–99)
Potassium: 4.3 mmol/L (ref 3.5–5.1)
Sodium: 141 mmol/L (ref 135–145)

## 2018-08-05 MED ORDER — MORPHINE SULFATE (CONCENTRATE) 10 MG/0.5ML PO SOLN: 10.0000 mg | ORAL | Status: DC | PRN

## 2018-08-05 MED ORDER — LORAZEPAM 2 MG/ML IJ SOLN: 0.5000 mg | Freq: Four times a day (QID) | INTRAMUSCULAR | Status: DC | PRN

## 2018-08-05 MED ORDER — SALINE SPRAY 0.65 % NA SOLN
1.0000 | NASAL | Status: DC | PRN
  Administered 2018-08-05: 1 via NASAL
  Filled 2018-08-05 (×2): qty 44

## 2018-08-05 NOTE — Progress Notes (Signed)
Family meeting with son and daughter Gwyndolyn Saxon and [name].  Both their spouses and patient's sister in the room.  I had discussed yesterday with her daughter and patient was made DO NOT RESUSCITATE and DO NOT INTUBATE.  Today patient has continued worsening needing 15 L oxygen through the high flow nasal cannula and saturations are 90%.  Continues to be fluid overloaded with poor response to IV diuresis.  Worsening renal function.  Family members split on how to proceed.  We discussed regarding aggressive versus palliative approach.  The main concern being patient on high flow nasal cannula cannot be discharged to skilled nursing facility.  At this point patient seems appropriate to be transition to comfort measures.  Sister and daughter would like to continue oxygen with high flow nasal cannula and Lasix and see how patient does in the 24 hours but also keep her on morphine and Ativan as needed.  Son tells me that they would like to continue treatment for themselves but he believes that comfort care is more appropriate for patient at this time.  After much discussion and explaining regarding comfort measures family requested we continue oxygen and Lasix for 24 hours.  Keep her off the cardiac monitor.  No blood work.  Transition to complete comfort measures and hospice home transfer if any further worsening.   Further critical care Time spent 35 minutes.

## 2018-08-05 NOTE — Progress Notes (Signed)
[facility] at [place] NAME: Tina [name]    MR#:  [id number]  DATE OF BIRTH:  09-08-1927  SUBJECTIVE:   Cardiac monitor removed overnight due to patient's agitation and pulling on the monitor multiple times.  Worsening with 15 L oxygen through high flow nasal cannula at this time.  Patient drowsy.  Looks critically ill.  Worsening significantly in spite of treatment. REVIEW OF SYSTEMS:  Unable to obtain due to dementia ROS  DRUG ALLERGIES:   Allergies  Allergen Reactions  . Sulfamethoxazole-Trimethoprim Other (See Comments)    Uncoded Allergy. Allergen: iv contrast dye, Other Reaction: Brkthru reaction Hives/itching  . Aspirin Other (See Comments)  . Clarithromycin Other (See Comments)  . Codeine Other (See Comments)    Other Reaction: Not Assessed  . Penicillins Other (See Comments)    VITALS:  Blood pressure 124/73, pulse 82, temperature 98.4 F (36.9 C), resp. rate (!) 22, height 5\' 7"  (1.702 m), weight 118.8 kg, SpO2 93 %.  PHYSICAL EXAMINATION:  GENERAL:  85 y.o.-year-old patient lying in the bed .Marland Kitchen  Obese.  Respiratory distress.  Looks critically ill EYES: Pupils equal, round, reactive to light and accommodation. No scleral icterus. Extraocular muscles intact.  HEENT: Head atraumatic, normocephalic. Oropharynx and nasopharynx clear.  NECK:  Supple, no jugular venous distention. No thyroid enlargement, no tenderness.  LUNGS: Creased air entry with bilateral wheezing CARDIOVASCULAR: S1, S2 normal. No murmurs, rubs, or gallops.  ABDOMEN: Soft, nontender, nondistended. Bowel sounds present. No organomegaly or mass.  EXTREMITIES: No pedal edema, cyanosis, or clubbing.  PSYCHIATRIC: The patient is drowsy.  Wakes up on calling her name SKIN: No obvious rash, lesion, or ulcer.   Physical Exam LABORATORY PANEL:   CBC Recent Labs  Lab 08/05/18 0317  WBC 10.4  HGB 11.4*  HCT 43.1  PLT 246    ------------------------------------------------------------------------------------------------------------------  Chemistries  Recent Labs  Lab 07/31/18 0702  08/05/18 0317  NA 139   < > 141  K 3.4*   < > 4.3  CL 99   < > 99  CO2 33*   < > 35*  GLUCOSE 110*   < > 116*  BUN 28*   < > 59*  CREATININE 1.77*   < > 2.05*  CALCIUM 8.4*   < > 8.9  MG 2.2  --   --    < > = values in this interval not displayed.   ------------------------------------------------------------------------------------------------------------------  Cardiac Enzymes No results for input(s): TROPONINI in the last 168 hours. ------------------------------------------------------------------------------------------------------------------  RADIOLOGY:  No results found.  ASSESSMENT AND PLAN:  *Acute on chronic respiratory failure  Worsening Continues to require high flow oxygen  Due to acute systolic CHF, COPD, and extreme morbid obesity Continue high flow nasal cannula at 15 L.  Significant worsening overnight.  *Acute on chronic systolic congestive heart failure sedation  Echocardiogram: LV EF: 30% - 35%.   Continue IV Lasix for further diuresis.  Seems fluid overloaded.  Appreciate cardiology following. Not responding to diuresis at this point.  *Acute COPD exacerbation Continue IV Solu-Medrol  Continues to have significant wheezing and worsening respirations  *Acute atrial flutter/ A fib,New onset.   *Chronic CAD Stable Continue  Lopressor if blood pressure will tolerate, Eliquis, and Lipitor She is not a good candidate for further cardiac catheterization, continue medical treatment per Dr. Fletcher Anon.  *CKD stage III Stable Avoid nephrotoxic agents   *Chronic diabetes mellitus, type II Hemoglobin A1c is 6.2 Continue sliding scale insulin with  Accu-Cheks  *Chronic morbid obesity  *Acute hypokalemia Repleted called  *Anemia of chronic disease Stable  All the records are  reviewed and case discussed with Care Management/Social Worker Management plans discussed with the patient, family and they are in agreement.  CODE STATUS: Full code  TOTAL CRITICAL CARE TIME TAKING CARE OF THIS PATIENT: 45 minutes.   Waiting for family.  Family meeting set up for 11 AM.  Neita Carp M.D on 08/05/2018   Between 7am to 6pm - Pager - [phone]  After 6pm go to [email or web address] - password [email or web address] [facility]  Office  [phone]  CC: Primary care physician; Patient, No Pcp Per  Note: This dictation was prepared with Dragon dictation along with smaller phrase technology. Any transcriptional errors that result from this process are unintentional.

## 2018-08-05 NOTE — Progress Notes (Signed)
Cardiac monitoring discontinued due to non-compliance

## 2018-08-06 LAB — GLUCOSE, CAPILLARY: Glucose-Capillary: 100 mg/dL — ABNORMAL HIGH (ref 70–99)

## 2018-08-06 NOTE — Progress Notes (Signed)
[facility] at [place] NAME: Tina [name]    MR#:  [id number]  DATE OF BIRTH:  1927-10-04  SUBJECTIVE:   Good UOP 5 liters overnight Eating with family feeding her REVIEW OF SYSTEMS:  Unable to obtain due to dementia ROS  DRUG ALLERGIES:   Allergies  Allergen Reactions  . Sulfamethoxazole-Trimethoprim Other (See Comments)    Uncoded Allergy. Allergen: iv contrast dye, Other Reaction: Brkthru reaction Hives/itching  . Aspirin Other (See Comments)  . Clarithromycin Other (See Comments)  . Codeine Other (See Comments)    Other Reaction: Not Assessed  . Penicillins Other (See Comments)    VITALS:  Blood pressure 116/72, pulse 77, temperature 98.4 F (36.9 C), resp. rate (!) 22, height 5\' 7"  (1.702 m), weight 115.8 kg, SpO2 93 %.  PHYSICAL EXAMINATION:  GENERAL:  85 y.o.-year-old patient lying in the bed .Marland Kitchen  Obese.  Respiratory distress.  Looks critically ill EYES: Pupils equal, round, reactive to light and accommodation. No scleral icterus. Extraocular muscles intact.  HEENT: Head atraumatic, normocephalic. Oropharynx and nasopharynx clear.  NECK:  Supple, no jugular venous distention. No thyroid enlargement, no tenderness.  LUNGS: Creased air entry with bilateral wheezing CARDIOVASCULAR: S1, S2 normal. No murmurs, rubs, or gallops.  ABDOMEN: Soft, nontender, nondistended. Bowel sounds present. No organomegaly or mass.  EXTREMITIES: No pedal edema, cyanosis, or clubbing.  PSYCHIATRIC: The patient is awake SKIN: No obvious rash, lesion, or ulcer.   Physical Exam LABORATORY PANEL:   CBC Recent Labs  Lab 08/05/18 0317  WBC 10.4  HGB 11.4*  HCT 43.1  PLT 246   ------------------------------------------------------------------------------------------------------------------  Chemistries  Recent Labs  Lab 07/31/18 0702  08/05/18 0317  NA 139   < > 141  K 3.4*   < > 4.3  CL 99   < > 99  CO2 33*   < > 35*  GLUCOSE 110*   <  > 116*  BUN 28*   < > 59*  CREATININE 1.77*   < > 2.05*  CALCIUM 8.4*   < > 8.9  MG 2.2  --   --    < > = values in this interval not displayed.   ------------------------------------------------------------------------------------------------------------------  Cardiac Enzymes No results for input(s): TROPONINI in the last 168 hours. ------------------------------------------------------------------------------------------------------------------  RADIOLOGY:  No results found.  ASSESSMENT AND PLAN:  *Acute on chronic respiratory failure  On 15 L O2 thru HFNC Due to acute systolic CHF, COPD, and extreme morbid obesity  *Acute on chronic systolic congestive heart failure sedation  Echocardiogram: LV EF: 30% - 35%.   Continue IV Lasix for further diuresis.  Still has significant fluid overload  *Acute COPD exacerbation  IV Solu-Medrol stopped  *Acute atrial flutter/ A fib,New onset.   *Chronic CAD Continue  Lopressor if blood pressure will tolerate, Eliquis, and Lipitor She is not a good candidate for further cardiac catheterization, continue medical treatment per Dr. Fletcher Anon.  *CKD stage III Stable Avoid nephrotoxic agents   *Chronic diabetes mellitus, type II Hemoglobin A1c is 6.2 Continue sliding scale insulin with Accu-Cheks  *Chronic morbid obesity  *Acute hypokalemia Repleted   *Anemia of chronic disease Stable  Patient had good urine output.  Is awake and able to eat some.  Continues to be on 15 L oxygen.  Discussed with family.  Discussed with palliative care team who will discuss further with family regarding goals of care  All the records are reviewed and case discussed with Care Management/Social Worker Management  plans discussed with the patient, family and they are in agreement.  CODE STATUS: Full code  TOTAL TIME TAKING CARE OF THIS PATIENT: 45 minutes.   Neita Carp M.D on 08/06/2018   Between 7am to 6pm - Pager -  [phone]  After 6pm go to [email or web address] - password [email or web address] [facility]  Office  [phone]  CC: Primary care physician; Patient, No Pcp Per  Note: This dictation was prepared with Dragon dictation along with smaller phrase technology. Any transcriptional errors that result from this process are unintentional.

## 2018-08-06 NOTE — Progress Notes (Signed)
Clinical Education officer, museum (CSW) contacted patient's son Gwyndolyn Saxon and spoke to him and patient's daughter [name]. Per [name] patient is still on several liters of oxygen and they are hoping she can get weaned off in order to go to [facility]. Per daughter they are also talking about hospice options as well. Daughter prefers [facility] facility if patient can't wean off the oxygen. Daughter asked when she needs to pick up patient's belongings from [facility]. CSW advised daughter to contact [facility] directly to coordinate with them about packing up her things. Daughter verbalized her understanding. [facility] liaison is aware of above. [facility] liaison is aware of above. CSW will continue to follow and assist as needed.   [employer], LCSW [phone]

## 2018-08-06 NOTE — Progress Notes (Signed)
PT Cancellation Note  Patient Details Name: Tina [name] MRN: [id number] DOB: [birth year]   Cancelled Treatment:    Reason Eval/Treat Not Completed: Other (comment)(PT spoke with RN this AM, informed patient has been transitioned to comfort care. PT to sign off and complete PT orders at this time.)   Lieutenant Diego PT, DPT 10:06 AM,08/06/18 [phone]

## 2018-08-06 NOTE — Care Management Important Message (Signed)
Copy of signed Medicare IM left with patient in room. 

## 2018-08-06 NOTE — Progress Notes (Addendum)
Patient's family asking about when palliative will be meeting with family. Palliative paged x2 with no call back. Dr. Darvin Neighbours paged. Per Dr. Darvin Neighbours he will call palliative to follow up on that. Will continue to monitor patient.   Update: Per Dr. Darvin Neighbours, palliative will meet with family first thing tomorrow morning. Family updated. Will continue to monitor patient.

## 2018-08-07 MED ORDER — RISPERIDONE 0.25 MG PO TABS
0.2500 mg | ORAL_TABLET | Freq: Every day | ORAL | Status: DC
  Administered 2018-08-07: 0.25 mg via ORAL
  Filled 2018-08-07 (×2): qty 1

## 2018-08-07 MED ORDER — IPRATROPIUM-ALBUTEROL 0.5-2.5 (3) MG/3ML IN SOLN
3.0000 mL | Freq: Three times a day (TID) | RESPIRATORY_TRACT | Status: DC
  Administered 2018-08-07 – 2018-08-09 (×6): 3 mL via RESPIRATORY_TRACT
  Filled 2018-08-07 (×7): qty 3

## 2018-08-07 MED ORDER — ENSURE ENLIVE PO LIQD
237.0000 mL | Freq: Two times a day (BID) | ORAL | Status: DC
  Administered 2018-08-07 – 2018-08-08 (×3): 237 mL via ORAL

## 2018-08-07 MED ORDER — FUROSEMIDE 40 MG PO TABS
40.0000 mg | ORAL_TABLET | Freq: Two times a day (BID) | ORAL | Status: DC
  Administered 2018-08-07 – 2018-08-10 (×3): 40 mg via ORAL
  Filled 2018-08-07 (×3): qty 1

## 2018-08-07 MED ORDER — HYDROMORPHONE HCL 1 MG/ML IJ SOLN: 0.5000 mg | INTRAMUSCULAR | Status: DC | PRN

## 2018-08-07 MED ORDER — LORAZEPAM 2 MG/ML IJ SOLN
0.5000 mg | INTRAMUSCULAR | Status: DC | PRN
  Administered 2018-08-08 – 2018-08-10 (×6): 0.5 mg via INTRAVENOUS
  Filled 2018-08-07 (×6): qty 1

## 2018-08-07 NOTE — Progress Notes (Signed)
Patient requiring additional oxygen. Patient has been on and off combative today and has been hitting staff when they try to help reposition her.

## 2018-08-07 NOTE — Progress Notes (Signed)
Palliative:  I met this morning with Ms. Petrosky who is lethargic and confused. We left her to rest while I discussed plans with daughters, [name] and [name]. Overall Ms. Carlis Abbott has had improved diuresis and decreased oxygen needs; however, she remains very tenuous. She appears much worse than when I met her a week ago and I fear that this improvement in diuresis and oxygen is likely very transient. I expressed my concerns to family and we discussed options moving forward. They agree that Ms. Wetherby is a poor candidate for rehab and cannot return to ALF. Family agree with me that Ms. Italiano is likely approaching the end of her life. She currently has poor QOL with insomnia and delirium. We discussed the following plan:  Exam: Lethargic, confused. Ill-appearing. Fatigued. No distress.   Plan: - Transition off IV Lasix to po diuretics - Transition to comfort with further decline - No HFNC, BiPAP, or ICU transfer - Reassess tomorrow for toleration of po diuretics vs appropriateness of hospice facility (very likely hospice facility appropriate).  - PRN medication added to assist with pain, SOB, anxiety, insomnia.   1658-0063, [id number] 95 min  Vinie Sill, NP Palliative Medicine Team Pager # [phone] (M-F 8a-5p) Team Phone # [phone] (Nights/Weekends)

## 2018-08-07 NOTE — Progress Notes (Signed)
[facility] at [place] NAME: Tina [name]    MR#:  [id number]  DATE OF BIRTH:  [birth year]  SUBJECTIVE:   Confused. Daughter at bedside.  REVIEW OF SYSTEMS:  Unable to obtain due to dementia.  ROS  DRUG ALLERGIES:   Allergies  Allergen Reactions  . Sulfamethoxazole-Trimethoprim Other (See Comments)    Uncoded Allergy. Allergen: iv contrast dye, Other Reaction: Brkthru reaction Hives/itching  . Aspirin Other (See Comments)  . Clarithromycin Other (See Comments)  . Codeine Other (See Comments)    Other Reaction: Not Assessed  . Penicillins Other (See Comments)    VITALS:  Blood pressure (!) 112/52, pulse 89, temperature 97.9 F (36.6 C), resp. rate 18, height 5\' 7"  (1.702 m), weight 115.8 kg, SpO2 (!) 84 %.  PHYSICAL EXAMINATION:  GENERAL:  85 y.o.-year-old patient lying in the bed  Obese.  Respiratory distress.  EYES: Pupils equal, round, reactive to light and accommodation. No scleral icterus. Extraocular muscles intact.  HEENT: Head atraumatic, normocephalic. Oropharynx and nasopharynx clear.  NECK:  Supple, no jugular venous distention. No thyroid enlargement, no tenderness.  LUNGS: Decreased air entry with bilateral wheezing CARDIOVASCULAR: S1, S2 normal. No murmurs, rubs, or gallops.  ABDOMEN: Soft, nontender, nondistended. Bowel sounds present. No organomegaly or mass.  EXTREMITIES: No pedal edema, cyanosis, or clubbing.  PSYCHIATRIC: The patient is drowzy SKIN: No obvious rash, lesion, or ulcer.   Physical Exam LABORATORY PANEL:   CBC Recent Labs  Lab 08/05/18 0317  WBC 10.4  HGB 11.4*  HCT 43.1  PLT 246   ------------------------------------------------------------------------------------------------------------------  Chemistries  Recent Labs  Lab 08/05/18 0317  NA 141  K 4.3  CL 99  CO2 35*  GLUCOSE 116*  BUN 59*  CREATININE 2.05*  CALCIUM 8.9    ------------------------------------------------------------------------------------------------------------------  Cardiac Enzymes No results for input(s): TROPONINI in the last 168 hours. ------------------------------------------------------------------------------------------------------------------  RADIOLOGY:  No results found.  ASSESSMENT AND PLAN:  *Acute on chronic respiratory failure  On 5 L o2 Due to acute systolic CHF, COPD, and extreme morbid obesity  *Acute on chronic systolic congestive heart failure sedation  Echocardiogram: LV EF: 30% - 35%.   Lasix switched to PO per palliative care discussion  *Acute COPD exacerbation  IV Solu-Medrol stopped  *Acute atrial flutter/ A fib,New onset.   *Chronic CAD  Eliquis, lopressor, and Lipitor She is not a good candidate for further cardiac catheterization, continue medical treatment per Dr. Fletcher Anon.  *CKD stage III Stable Avoid nephrotoxic agents   *Chronic diabetes mellitus, type II Hemoglobin A1c is 6.2 Continue sliding scale insulin with Accu-Cheks  *Chronic morbid obesity  *Acute hypokalemia Repleted   *Anemia of chronic disease Stable  Discussed with palliative care.  Transition to comfort measures if any worsening by tomorrow.  All the records are reviewed and case discussed with Care Management/Social Worker Management plans discussed with the patient, family and they are in agreement.  CODE STATUS: Full code  TOTAL TIME TAKING CARE OF THIS PATIENT: 35 minutes.   Neita Carp M.D on 08/07/2018   Between 7am to 6pm - Pager - [phone]  After 6pm go to [email or web address] - password [email or web address] [facility]  Office  [phone]  CC: Primary care physician; Patient, No Pcp Per  Note: This dictation was prepared with Dragon dictation along with smaller phrase technology. Any transcriptional errors that result from this process are unintentional.

## 2018-08-08 ENCOUNTER — Ambulatory Visit: Payer: Medicare Other | Admitting: Family

## 2018-08-08 LAB — BASIC METABOLIC PANEL
Anion gap: 7 (ref 5–15)
BUN: 48 mg/dL — ABNORMAL HIGH (ref 8–23)
CO2: 38 mmol/L — ABNORMAL HIGH (ref 22–32)
Calcium: 8.7 mg/dL — ABNORMAL LOW (ref 8.9–10.3)
Chloride: 96 mmol/L — ABNORMAL LOW (ref 98–111)
Creatinine, Ser: 1.74 mg/dL — ABNORMAL HIGH (ref 0.44–1.00)
GFR calc Af Amer: 30 mL/min — ABNORMAL LOW (ref >60)
GFR calc non Af Amer: 26 mL/min — ABNORMAL LOW (ref >60)
Glucose, Bld: 119 mg/dL — ABNORMAL HIGH (ref 70–99)
Potassium: 4.3 mmol/L (ref 3.5–5.1)
Sodium: 141 mmol/L (ref 135–145)

## 2018-08-08 LAB — BRAIN NATRIURETIC PEPTIDE: B Natriuretic Peptide: 936 pg/mL — ABNORMAL HIGH (ref 0.0–100.0)

## 2018-08-08 MED ORDER — GLYCOPYRROLATE 1 MG PO TABS
1.0000 mg | ORAL_TABLET | ORAL | Status: DC | PRN
  Filled 2018-08-08: qty 1

## 2018-08-08 MED ORDER — BIOTENE DRY MOUTH MT LIQD: 15.0000 mL | OROMUCOSAL | Status: DC | PRN

## 2018-08-08 MED ORDER — HYDROMORPHONE HCL 1 MG/ML IJ SOLN
0.5000 mg | INTRAMUSCULAR | Status: DC | PRN
  Administered 2018-08-09 (×2): 0.5 mg via INTRAVENOUS
  Administered 2018-08-10 (×2): 1 mg via INTRAVENOUS
  Filled 2018-08-08 (×4): qty 1

## 2018-08-08 MED ORDER — ONDANSETRON HCL 4 MG/2ML IJ SOLN: 4.0000 mg | Freq: Four times a day (QID) | INTRAMUSCULAR | Status: DC | PRN

## 2018-08-08 MED ORDER — GLYCOPYRROLATE 0.2 MG/ML IJ SOLN
0.2000 mg | INTRAMUSCULAR | Status: DC | PRN
  Filled 2018-08-08: qty 1

## 2018-08-08 MED ORDER — ONDANSETRON 4 MG PO TBDP
4.0000 mg | ORAL_TABLET | Freq: Four times a day (QID) | ORAL | Status: DC | PRN
  Filled 2018-08-08: qty 1

## 2018-08-08 MED ORDER — PANTOPRAZOLE SODIUM 40 MG IV SOLR: 40.0000 mg | INTRAVENOUS | Status: DC

## 2018-08-08 MED ORDER — POLYVINYL ALCOHOL 1.4 % OP SOLN
1.0000 [drp] | Freq: Four times a day (QID) | OPHTHALMIC | Status: DC | PRN
  Filled 2018-08-08: qty 15

## 2018-08-08 NOTE — Progress Notes (Signed)
Palliative:  I met this morning at Ms. Enneking' bedside with son and daughter, [name] and [name]. I explained that Ms. Platt continues to decline with sats in 60s on 6L nasal cannula. Lips are cyanotic. Although she is suprisingly still alert and communicating at times and eating/drinking but likely aspirating with increased coughing with all intake. She is net output -1.5L+ but still with decline. I explained that her breathes are shallow with little air movement. BLE edema is slightly decreased but feet are cool to touch. [name] would like to do some blood work - I believe she is looking for further validation that full comfort care is the right decision. [name] supports if this is what [name] wants but he feels comfortable moving forward with comfort care.   Late entry: I reviewed results of BMET and BNP with family (children Wynetta Emery, Bill) and they all agree that we should transition to comfort care at this time. They discuss any further family/friends that need to visit. We will d/c all medications not providing comfort, liberalizing diet, prn comfort meds in place, no further blood work. They understand that prognosis is hours to days and that Ms. Evola' could decline further at any point. We discussed small pleasures that we can provide to give Ms. Njoku joy and comfort at the end of her life. I do not feel she is stable for transition out of hospital at this time. If she is stable with no change tomorrow we could consider transition to hospice facility if this seems appropriate.   Exam: Alert, confused (worse than baseline). Lips cyanotic. Decreased breathe sounds, shallow breathing. BLE edema slightly improved but feet cool to touch.   Plan: - Full comfort care - Likely hospital death - PRN medications added to ensure comfort - may adjust/increase as needed to achieve comfort - Medications d/c to minimize pill burden at EOL  [phone], [id number] 45 min  Vinie Sill, NP Palliative Medicine  Team Pager # [phone] (M-F 8a-5p) Team Phone # [phone] (Nights/Weekends)

## 2018-08-08 NOTE — Progress Notes (Signed)
[place] at [place] NAME: Tina [name]    MR#:  [id number]  DATE OF BIRTH:  1928/06/10  SUBJECTIVE:   Confused. Daughters at bedside.  REVIEW OF SYSTEMS:  Unable to obtain due to dementia.  ROS  DRUG ALLERGIES:   Allergies  Allergen Reactions  . Sulfamethoxazole-Trimethoprim Other (See Comments)    Uncoded Allergy. Allergen: iv contrast dye, Other Reaction: Brkthru reaction Hives/itching  . Aspirin Other (See Comments)  . Clarithromycin Other (See Comments)  . Codeine Other (See Comments)    Other Reaction: Not Assessed  . Penicillins Other (See Comments)    VITALS:  Blood pressure (!) 87/50, pulse (!) 107, temperature 98.5 F (36.9 C), temperature source Oral, resp. rate 20, height 5\' 7"  (1.702 m), weight 115.8 kg, SpO2 (!) 66 %.  PHYSICAL EXAMINATION:  GENERAL:  85 y.o.-year-old patient lying in the bed  Obese.  Respiratory distress.  EYES: Pupils equal, round, reactive to light and accommodation. No scleral icterus. Extraocular muscles intact.  HEENT: Head atraumatic, normocephalic. Oropharynx and nasopharynx clear.  NECK:  Supple, no jugular venous distention. No thyroid enlargement, no tenderness.  LUNGS: Decreased air entry with bilateral wheezing CARDIOVASCULAR: S1, S2 normal. No murmurs, rubs, or gallops.  ABDOMEN: Soft, nontender, nondistended. Bowel sounds present. No organomegaly or mass.  EXTREMITIES: No pedal edema, cyanosis, or clubbing.  PSYCHIATRIC: The patient is drowzy SKIN: No obvious rash, lesion, or ulcer.   Physical Exam LABORATORY PANEL:   CBC Recent Labs  Lab 08/05/18 0317  WBC 10.4  HGB 11.4*  HCT 43.1  PLT 246   ------------------------------------------------------------------------------------------------------------------  Chemistries  Recent Labs  Lab 08/08/18 0901  NA 141  K 4.3  CL 96*  CO2 38*  GLUCOSE 119*  BUN 48*  CREATININE 1.74*  CALCIUM 8.7*    ------------------------------------------------------------------------------------------------------------------  Cardiac Enzymes No results for input(s): TROPONINI in the last 168 hours. ------------------------------------------------------------------------------------------------------------------  RADIOLOGY:  No results found.  ASSESSMENT AND PLAN:  *Acute on chronic respiratory failure  *Acute on chronic systolic congestive heart failure *Acute COPD exacerbation *Acute atrial flutter/ A fib,New onset.  *Chronic CAD *CKD stage III *Chronic diabetes mellitus, type II *Chronic morbid obesity *Acute hypokalemia *Anemia of chronic disease  Patient treated aggressively but no improvement.  Discussed with family and palliative care.  Patient will be transition to comfort measures today.   All the records are reviewed and case discussed with Care Management/Social Worker Management plans discussed with the patient, family and they are in agreement.  CODE STATUS: Full code  TOTAL TIME TAKING CARE OF THIS PATIENT: 35 minutes.  Greater than 50% time spent in discussing with family and coordinating care.  Neita Carp M.D on 08/08/2018   Between 7am to 6pm - Pager - [phone]  After 6pm go to [email or web address] - password [email or web address] [facility]  Office  [phone]  CC: Primary care physician; Patient, No Pcp Per  Note: This dictation was prepared with Dragon dictation along with smaller phrase technology. Any transcriptional errors that result from this process are unintentional.

## 2018-08-08 NOTE — Progress Notes (Signed)
Patient currently on [facility] and has 02 sats in 60-70s.  She is not in any distress.  Per family and palliative notes, she is not to be titrated up to HFNC at this time.  Dr. Darvin Neighbours aware.

## 2018-08-09 NOTE — Progress Notes (Signed)
No c/o pain.  States its hard to breath.  Dilaudid 0.5 mg IVP given.

## 2018-08-09 NOTE — Progress Notes (Signed)
Palliative:  Tina [name] is resting comfortably and children, Tina [name] and Tina [name], at bedside. She has recently received prn dilaudid and ativan for SOB, anxiety. Her oxygen saturations have increased since yesterday. She remains with fluid overload and has been unable to receive po Lasix yesterday which I have kept as a comfort measure IF she is able to take po pills. She has been eating and drinking some but this has declined over the past 24-48 hours and she coughs and likely aspirating with intake. Prognosis remains poor with < 2 weeks with progressing CHF with poor response to aggressive diuresis. Family have agreed with full comfort measures and agree with transition to hospice facility (if bed available) for EOL care. All questions/concerns addressed. Emotional support provided.   Exam: Sleeping. No distress. Fluctuating oxygen saturations. Continued generalized edema with pitting edema in BLE. I did not awaken her to assess further given goals of comfort and appearance of comfort at this time.   Plan: - Full comfort care - PRN medications added to ensure comfort - may adjust/increase as needed to achieve comfort - Medications d/c to minimize pill burden at EOL - I believe we now have a window to transition care to hospice facility for EOL care - family on board  25 min  Vinie Sill, NP Palliative Medicine Team Pager # [phone] (M-F 8a-5p) Team Phone # [phone] (Nights/Weekends)

## 2018-08-09 NOTE — Progress Notes (Signed)
Oxygen sats remain in mid 80's on 6L [place]

## 2018-08-09 NOTE — Clinical Social Work Note (Addendum)
CSW was informed by palliative that patient's family would like her to go to [facility] and [facility] facility.  CSW contacted nurse liasion [facility] and made the referral.  Hospice home does not currently have a bed available, but Hospice home will let CSW know if one becomes available.  CSW contacted patient's son Gwyndolyn Saxon and updated him that there is not a bed available currently, but Hospice facility is aware.  Jones Broom. [place], MSW, [place]  08/09/2018 3:40 PM

## 2018-08-09 NOTE — Plan of Care (Signed)
Patient's family (her children) are extremely support of the patient.  They have arranged so that one of them is here at all times.

## 2018-08-09 NOTE — Progress Notes (Signed)
Patient got frantic, removing her oxygen.  O2 sats on RA 77%.  reinserted. Flow rate increased to 8 LNC.  Ativan and dilaudid given.  Sats on 8L  85 - 92%.

## 2018-08-09 NOTE — Progress Notes (Signed)
New hospice home referral received from [place] following a Palliative medicine follow up visit. Writer met in the room with patient's daughter [name] and son Gwyndolyn Saxon to initiate education regarding hospice services, philosophy and team approach to care with understanding voiced. Questions answered, emotional support given. Patient remained asleep, with short periods of restlessness noted, education given and family encouraged to advocate for comfort medications as they report patient has been very agitated and restless at night. Hospital care team  and family aware there is currently no bed available. Plan is to follow up in the morning. Patient information faxed to referral. Flo Shanks RN, BSN, [facility] and [facility] [place], hospital Liaison [phone]

## 2018-08-09 NOTE — Progress Notes (Signed)
Transition to comfort measures.  Continue oral Lasix if she cannot take pills.  Waiting on hospice home bed.

## 2018-08-10 MED ORDER — LORAZEPAM 2 MG/ML IJ SOLN: 1.0000 mg | Freq: Every day | INTRAMUSCULAR | Status: DC

## 2018-08-10 NOTE — Care Management Important Message (Signed)
Copy of signed Medicare IM left with patient in room. 

## 2018-08-10 NOTE — Progress Notes (Signed)
Follow up visit made to new hospice home referral. Patient seen lying in bed, appeared to be sleeping. Daughter [name] and son [name] present. Hospice home bed is available today, family remains in agreement for transfer.  Report called to the hospice home. Discharge summary faxed to referral. EMS notified for 2:30 pick up. Family in agreement with premedication prior to transfer. Staff RN Bethel Born aware. Flo Shanks RN, BSN, [facility] and [facility] [place], hospital liaison [phone]

## 2018-08-10 NOTE — Progress Notes (Signed)
Palliative:  I met again this morning with Tina [name]. Tina [name] is more alert today but still slow and confused. She has just finished with her bath. She has baseline confusion which has been worse since hospitalized. Still requiring prn medications for SOB and anxiety.   Family arrived to bedside Tina [name] and Tina [name]). Tina [name] reports some restlessness last night and I will add Ativan 1 mg IV scheduled qhs to assist with more peaceful nights. Discussed utilizing prn medication sooner than later with symptoms for better control and efficacy. Some family struggling more than others with periods of alertness and hopefulness but they all still agree with full comfort care and transition to hospice facility. Emotional support provided. All questions/concerns addressed.   Exam: Alert, confused. Continues on 6L nasal cannula with no current distress (I have previously discussed with Tina [name] decreasing oxygen at hospice). Dry cough especially after intake. Oxygen levels continue to fluctuate 70-90s over past 24 hrs.   Plan: - Transition to hospice facility when bed available - PRN medications added for comfort - adjust/increase as needed to ensure comfort - PO medications minimized for comfort - Adding scheduled Ativan 1 mg IV qhs; may likely benefit from further scheduled medications to ensure comfort  25 min  Vinie Sill, NP Palliative Medicine Team Pager # [phone] (M-F 8a-5p) Team Phone # [phone] (Nights/Weekends)

## 2018-08-10 NOTE — Progress Notes (Signed)
[place] at [place] NAME: Maeleigh Buschman    MR#:  [id number]  DATE OF BIRTH:  21-Feb-1928  SUBJECTIVE:   Confused. Seems comfortable. On 4 L o2  REVIEW OF SYSTEMS:  Unable to obtain due to dementia.  ROS  DRUG ALLERGIES:   Allergies  Allergen Reactions  . Sulfamethoxazole-Trimethoprim Other (See Comments)    Uncoded Allergy. Allergen: iv contrast dye, Other Reaction: Brkthru reaction Hives/itching  . Aspirin Other (See Comments)  . Clarithromycin Other (See Comments)  . Codeine Other (See Comments)    Other Reaction: Not Assessed  . Penicillins Other (See Comments)    VITALS:  Blood pressure 121/81, pulse (!) 126, temperature 97.6 F (36.4 C), temperature source Oral, resp. rate (!) 24, height 5\' 7"  (1.702 m), weight 115.8 kg, SpO2 98 %.  PHYSICAL EXAMINATION:  GENERAL:  85 y.o.-year-old patient lying in the bed  Obese.  Respiratory distress.  EYES: Pupils equal, round, reactive to light and accommodation. No scleral icterus. Extraocular muscles intact.  HEENT: Head atraumatic, normocephalic. Oropharynx and nasopharynx clear.  NECK:  Supple, no jugular venous distention. No thyroid enlargement, no tenderness.  LUNGS: Decreased air entry with bilateral wheezing CARDIOVASCULAR: S1, S2 normal. No murmurs, rubs, or gallops.  ABDOMEN: Soft, nontender, nondistended. Bowel sounds present. No organomegaly or mass.  EXTREMITIES: No pedal edema, cyanosis, or clubbing.  PSYCHIATRIC: The patient is drowzy SKIN: No obvious rash, lesion, or ulcer.   Physical Exam LABORATORY PANEL:   CBC Recent Labs  Lab 08/05/18 0317  WBC 10.4  HGB 11.4*  HCT 43.1  PLT 246   ------------------------------------------------------------------------------------------------------------------  Chemistries  Recent Labs  Lab 08/08/18 0901  NA 141  K 4.3  CL 96*  CO2 38*  GLUCOSE 119*  BUN 48*  CREATININE 1.74*  CALCIUM 8.7*    ------------------------------------------------------------------------------------------------------------------  Cardiac Enzymes No results for input(s): TROPONINI in the last 168 hours. ------------------------------------------------------------------------------------------------------------------  RADIOLOGY:  No results found.  ASSESSMENT AND PLAN:  *Acute on chronic respiratory failure  *Acute on chronic systolic congestive heart failure *Acute COPD exacerbation *Acute atrial flutter/ A fib,New onset.  *Chronic CAD *CKD stage III *Chronic diabetes mellitus, type II *Chronic morbid obesity *Acute hypokalemia *Anemia of chronic disease  ON comfort measures   All the records are reviewed and case discussed with Care Management/Social Worker Management plans discussed with the patient, family and they are in agreement.  CODE STATUS: Full code  TOTAL TIME TAKING CARE OF THIS PATIENT: 25 minutes.  Greater than 50% time spent in discussing with family and coordinating care.  Neita Carp M.D on    Between 7am to 6pm - Pager - [phone]  After 6pm go to [email or web address] - password [email or web address] [facility]  Office  [phone]  CC: Primary care physician; Patient, No Pcp Per  Note: This dictation was prepared with Dragon dictation along with smaller phrase technology. Any transcriptional errors that result from this process are unintentional.

## 2018-08-10 NOTE — Clinical Social Work Note (Signed)
CSW was informed by [facility], that patient has a bed available at hospice facility.  Family are at bedside, and aware that patient will be discharging today.  Patient to be d/c'ed today to [facility].  Patient and family agreeable to plans will transport via [facility] hospice home nurse liasion to call report.  Evette Cristal, MSW, [place]

## 2018-08-10 NOTE — Progress Notes (Signed)
Patient discharged to inpatient hospice with IV in place.  The family requested I give her 1 mg of IV hydrocodone so that she would be comfortable on the ride.

## 2018-08-10 NOTE — Discharge Summary (Signed)
[facility] NAME: Altair Stanko    MR#:  [id number]  DATE OF BIRTH:  Aug 21, 1927  DATE OF ADMISSION:  07/29/2018 ADMITTING PHYSICIAN: Hillary Bow, MD  DATE OF DISCHARGE:   PRIMARY CARE PHYSICIAN: Patient, No Pcp Per   ADMISSION DIAGNOSIS:  Acute on chronic respiratory failure with hypoxia (HCC) [J96.21] Acute on chronic congestive heart failure, unspecified heart failure type ([place]) [I50.9]  DISCHARGE DIAGNOSIS:  Active Problems:   CHF (congestive heart failure) (HCC)   Acute on chronic systolic heart failure (HCC)   Atrial fibrillation with rapid ventricular response (HCC)   Acute on chronic respiratory failure with hypoxia (HCC)   Goals of care, counseling/discussion   Palliative care encounter   SECONDARY DIAGNOSIS:   Past Medical History:  Diagnosis Date  . (HFpEF) heart failure with preserved ejection fraction ([place])    a. 08/2015 Echo: EF 60%. Nl right sided pressures.  . Anemia   . Anxiety   . Bronchitis   . CAD (coronary artery disease)    a. Reports 2 MI's in her 75's - details unknown; b. s/p prior PCI (no details/timing); b. 10/2006 MV: No sichemia/infarct. EF 77%; c. 2018 MV: reportedly nl per pt.  . CKD stage 3 due to type 2 diabetes mellitus ([place])   . Colon cancer ([place])    treated, surgery, chemo, radiation  . COPD (chronic obstructive pulmonary disease) (HCC)    on 2L home o2  . Diabetes mellitus without complication ([place])   . Obesity   . Osteoarthritis      ADMITTING HISTORY  HISTORY OF PRESENT ILLNESS:  Rockell Faulks  is a 85 y.o. female with a known history of diabetes mellitus, hypertension, CKD stage III, colon cancer, CAD, COPD, chronic respiratory failure, CHF, obesity who is bedbound at [facility] presents to the emergency room sent in due to worsening shortness of breath.  Patient has had chronic shortness of breath but was noticed to be more short of breath at [facility] and sent to  the ED.  Here chest x-ray shows pulmonary edema.  Needing oxygen.  Patient is poor historian.  Daughter at bedside.  Old records reviewed.  No cough or chest pain.  No recent change in medications.  Patient does not wear oxygen as advised. Also noticed to have tachycardia with atrial flutter. Mild cognitive impairment  HOSPITAL COURSE:   *Acute on chronic respiratory failure  *Acute on chronic systoliccongestive heart failure *Acute COPD exacerbation *Acuteatrial flutter/ A fib,New onset. *ChronicCAD *CKD stage III *Chronic diabetes mellitus, type II *Chronic morbid obesity *Acutehypokalemia *Anemia of chronic disease  Patient was admitted to ICU.  Was on BiPAP initially.  Cardiac monitoring.  Followed by cardiology.  Slowly transition to high flow nasal cannula and then medical floor.  Patient in spite of aggressive treatment with diuresis, oxygen support, nebulizers, steroids did not improve.  Continued to worsen.  Her dementia caused inpatient delirium.  Due to worsening status palliative care was consulted.  After discussing with family patient will slowly transition to comfort measures.  At this point family wants patient transferred to hospice home.  Will be discharged later today.  CONSULTS OBTAINED:   Cardiology, palliative care, pulmonary critical care, physical therapy, hospice DRUG ALLERGIES:   Allergies  Allergen Reactions  . Sulfamethoxazole-Trimethoprim Other (See Comments)    Uncoded Allergy. Allergen: iv contrast dye, Other Reaction: Brkthru reaction Hives/itching  . Aspirin Other (See Comments)  . Clarithromycin Other (See Comments)  .  Codeine Other (See Comments)    Other Reaction: Not Assessed  . Penicillins Other (See Comments)    DISCHARGE MEDICATIONS:   Allergies as of       Reactions   Sulfamethoxazole-trimethoprim Other (See Comments)   Uncoded Allergy. Allergen: iv contrast dye, Other Reaction: Brkthru reaction Hives/itching   Aspirin  Other (See Comments)   Clarithromycin Other (See Comments)   Codeine Other (See Comments)   Other Reaction: Not Assessed   Penicillins Other (See Comments)      Medication List    STOP taking these medications   acetaminophen 500 MG tablet Commonly known as:  TYLENOL   acetaminophen 650 MG CR tablet Commonly known as:  TYLENOL   albuterol 108 (90 Base) MCG/ACT inhaler Commonly known as:  PROVENTIL HFA;VENTOLIN HFA   alum & mag hydroxide-simeth 400-400-40 MG/5ML suspension Commonly known as:  MAALOX PLUS   amoxicillin-clavulanate 875-125 MG tablet Commonly known as:  AUGMENTIN   aspirin 81 MG chewable tablet   BAZA PROTECT EX   buPROPion 150 MG 24 hr tablet Commonly known as:  WELLBUTRIN XL   calcium carbonate 500 MG chewable tablet Commonly known as:  TUMS - dosed in mg elemental calcium   cetaphil lotion   clotrimazole 1 % cream Commonly known as:  LOTRIMIN   docusate sodium 100 MG capsule Commonly known as:  COLACE   donepezil 5 MG tablet Commonly known as:  ARICEPT   ferrous sulfate 325 (65 FE) MG tablet   Fluticasone-Salmeterol 250-50 MCG/DOSE Aepb Commonly known as:  ADVAIR   guaifenesin 100 MG/5ML syrup Commonly known as:  ROBITUSSIN   guaiFENesin 600 MG 12 hr tablet Commonly known as:  MUCINEX   INCRUSE ELLIPTA 62.5 MCG/INH Aepb Generic drug:  umeclidinium bromide   insulin aspart 100 UNIT/ML injection Commonly known as:  novoLOG   insulin detemir 100 UNIT/ML injection Commonly known as:  LEVEMIR   insulin glargine 100 UNIT/ML injection Commonly known as:  LANTUS   insulin regular 100 units/mL injection Commonly known as:  NOVOLIN R,HUMULIN R   ipratropium-albuterol 0.5-2.5 (3) MG/3ML Soln Commonly known as:  DUONEB   lactulose (encephalopathy) 10 GM/15ML Soln Commonly known as:  CHRONULAC   levothyroxine 125 MCG tablet Commonly known as:  SYNTHROID, LEVOTHROID   loperamide 2 MG capsule Commonly known as:  IMODIUM   loratadine  10 MG tablet Commonly known as:  CLARITIN   LORazepam 0.5 MG tablet Commonly known as:  ATIVAN   magnesium hydroxide 400 MG/5ML suspension Commonly known as:  MILK OF MAGNESIA   metoprolol succinate 50 MG 24 hr tablet Commonly known as:  TOPROL-XL   neomycin-bacitracin-polymyxin [phone] ointment   nystatin powder Generic drug:  nystatin   omeprazole 20 MG capsule Commonly known as:  PRILOSEC   ondansetron 4 MG tablet Commonly known as:  ZOFRAN   oxyCODONE 5 MG immediate release tablet Commonly known as:  Oxy IR/ROXICODONE   polyvinyl alcohol 1.4 % ophthalmic solution Commonly known as:  LIQUIFILM TEARS   potassium chloride SA 20 MEQ tablet Commonly known as:  K-DUR,KLOR-CON   risperiDONE 0.25 MG tablet Commonly known as:  RISPERDAL   saccharomyces boulardii 250 MG capsule Commonly known as:  FLORASTOR   sertraline 50 MG tablet Commonly known as:  ZOLOFT   simethicone 80 MG chewable tablet Commonly known as:  MYLICON   tiotropium 18 MCG inhalation capsule Commonly known as:  SPIRIVA   Vitamin D 50 MCG (2000 UT) Caps     TAKE these medications  furosemide 40 MG tablet Commonly known as:  LASIX Take 40 mg by mouth 2 (two) times daily. Take 40 mg by mouth twice daily at 6 AM and 4 PM.       Today   VITAL SIGNS:  Blood pressure 121/81, pulse (!) 126, temperature 97.6 F (36.4 C), temperature source Oral, resp. rate (!) 24, height 5\' 7"  (1.702 m), weight 115.8 kg, SpO2 98 %.  I/O:    Intake/Output Summary (Last 24 hours) at  1208 Last data filed at  0520 Gross per 24 hour  Intake 0 ml  Output 1550 ml  Net -1550 ml    PHYSICAL EXAMINATION:  Physical Exam  GENERAL:  85 y.o.-year-old patient lying in the bed , obese LUNGS: Coarse breath sounds CARDIOVASCULAR: S1, S2  ABDOMEN: Soft, non-tender,  PSYCHIATRIC: The patient is drowsy.  Confused DATA REVIEW:   CBC Recent Labs  Lab 08/05/18 0317  WBC 10.4  HGB 11.4*  HCT  43.1  PLT 246    Chemistries  Recent Labs  Lab 08/08/18 0901  NA 141  K 4.3  CL 96*  CO2 38*  GLUCOSE 119*  BUN 48*  CREATININE 1.74*  CALCIUM 8.7*    Cardiac Enzymes No results for input(s): TROPONINI in the last 168 hours.  Microbiology Results  Results for orders placed or performed during the hospital encounter of 07/29/18  MRSA PCR Screening     Status: None   Collection Time: 07/29/18  8:48 PM  Result Value Ref Range Status   MRSA by PCR NEGATIVE NEGATIVE Final    Comment:        The GeneXpert MRSA Assay (FDA approved for NASAL specimens only), is one component of a comprehensive MRSA colonization surveillance program. It is not intended to diagnose MRSA infection nor to guide or monitor treatment for MRSA infections. Performed at [facility], [address] [place], [place] [address]     RADIOLOGY:  No results found.  Follow up with PCP in 1 week.  Management plans discussed with the patient, family and they are in agreement.  CODE STATUS:     Code Status Orders  (From admission, onward)         Start     Ordered   08/08/18 1051  Do not attempt resuscitation (DNR)  Continuous    Question Answer Comment  In the event of cardiac or respiratory ARREST Do not call a "code blue"   In the event of cardiac or respiratory ARREST Do not perform Intubation, CPR, defibrillation or ACLS   In the event of cardiac or respiratory ARREST Use medication by any route, position, wound care, and other measures to relive pain and suffering. May use oxygen, suction and manual treatment of airway obstruction as needed for comfort.      08/08/18 1051        Code Status History    Date Active Date Inactive Code Status Order ID Comments User Context   08/04/2018 1502 08/08/2018 1051 DNR [id number]  Hillary Bow, MD Inpatient   07/29/2018 1240 08/04/2018 1502 Full Code [id number]  Hillary Bow, MD ED   08/23/2016 1653 08/26/2016 2255 Full Code [id number]   Theodoro Grist, MD ED   08/25/2015 2044 08/27/2015 2029 Full Code [id number]  Gladstone Lighter, MD Inpatient      TOTAL TIME TAKING CARE OF THIS PATIENT ON DAY OF DISCHARGE: more than 30 minutes.   Neita Carp M.D on  at 12:08 PM  Between 7am to 6pm -  Pager - [phone]  After 6pm go to [email or web address] - password [email or web address] [facility]  Office  [phone]  CC: Primary care physician; Patient, No Pcp Per  Note: This dictation was prepared with Dragon dictation along with smaller phrase technology. Any transcriptional errors that result from this process are unintentional.

## 2018-08-25 DEATH — deceased

## 2020-07-27 IMAGING — CR DG CHEST 2V
2 series · 2 of 2 positions shown · non-contrast
Comparison: Radiographs July 29, 2018.

CLINICAL DATA: Hypoxia, shortness of breath.

EXAM:
CHEST - 2 VIEW

[chest ap]
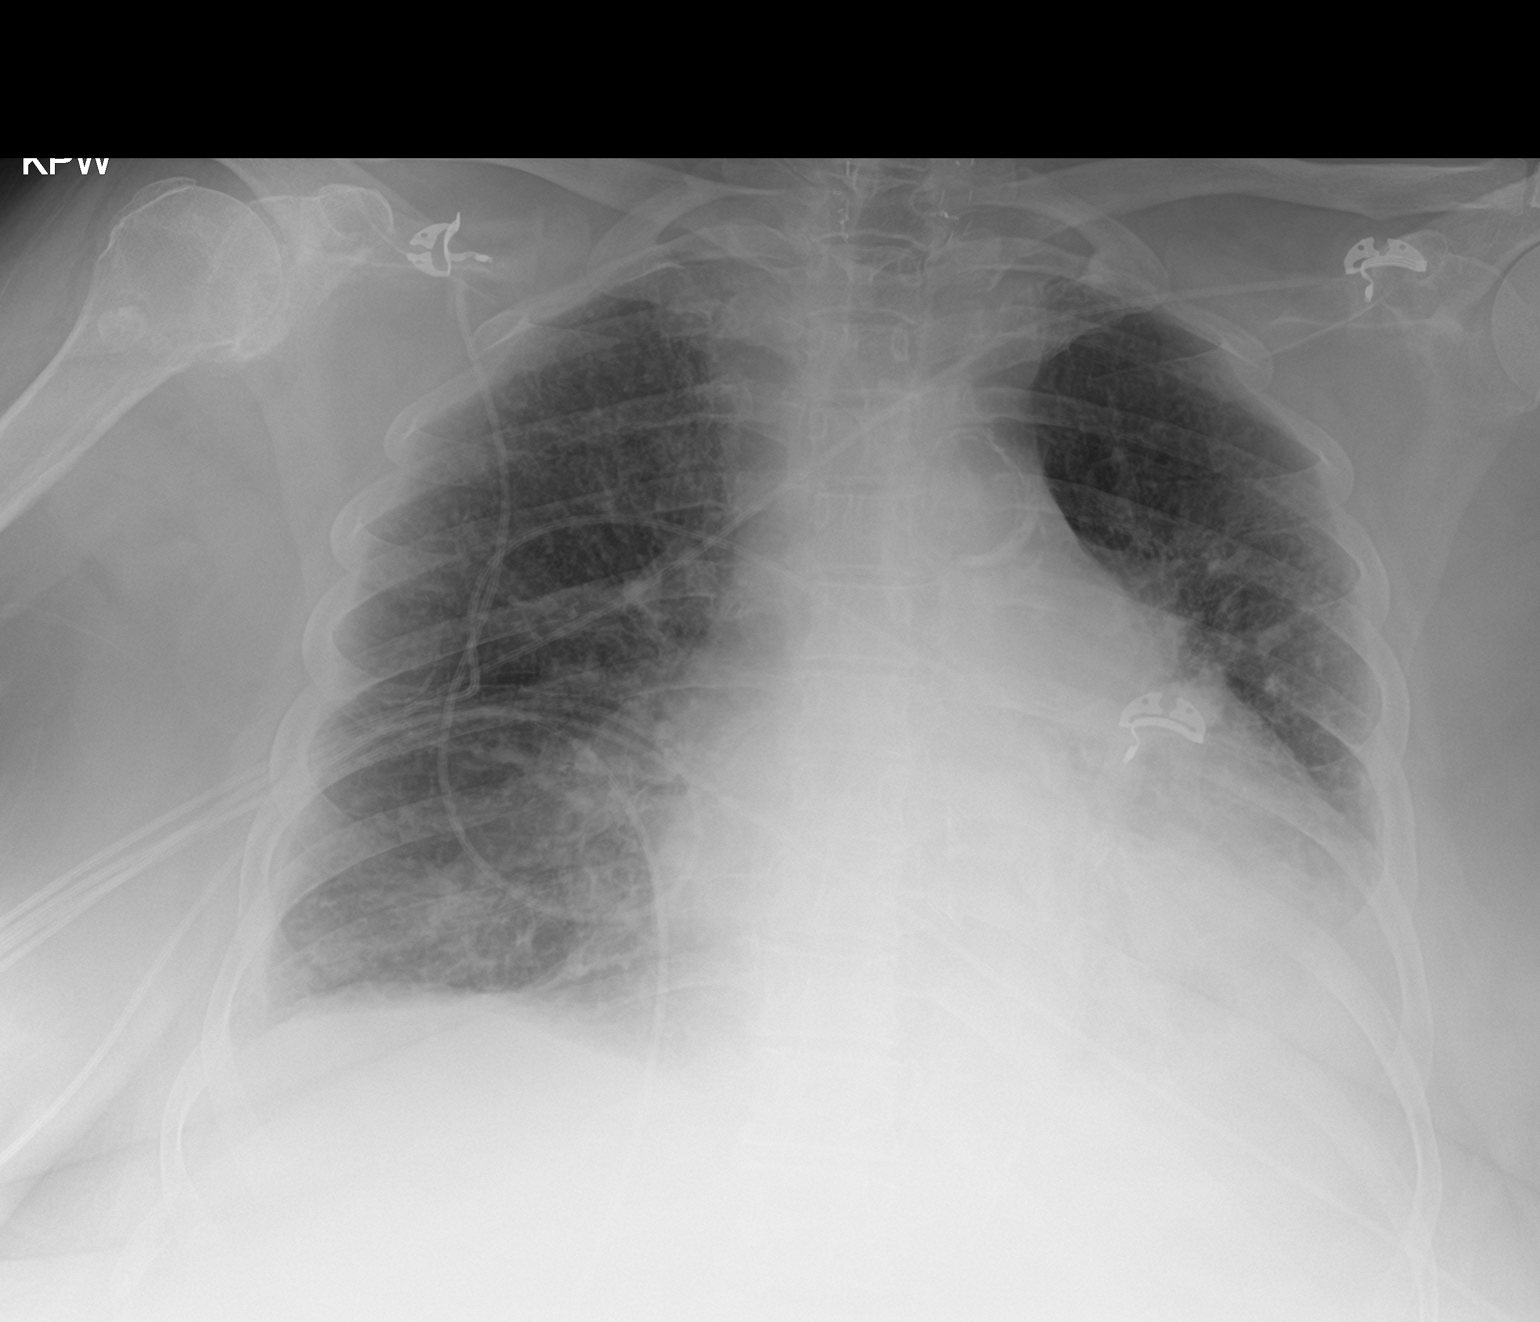

[chest lat]
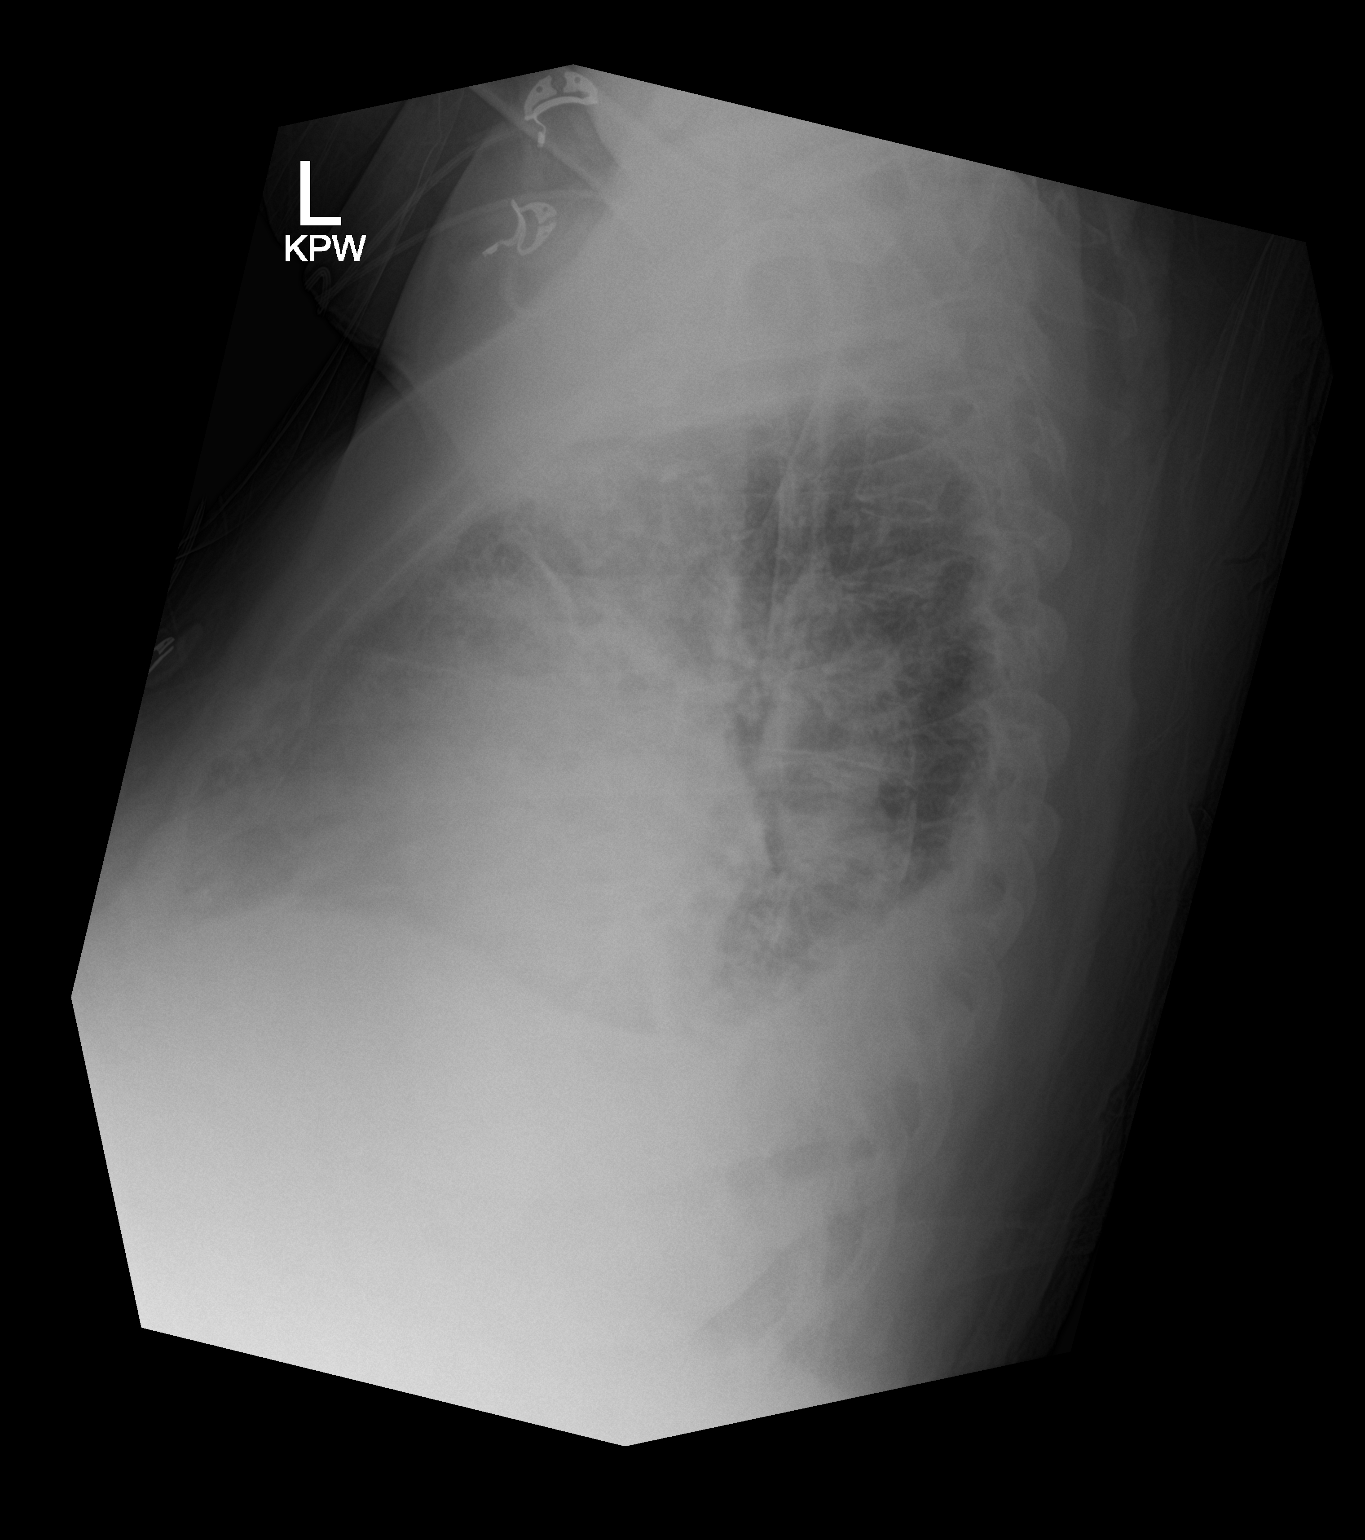

[2 of 2 positions shown; findings below may reference images not displayed]

FINDINGS: Stable cardiomegaly with central pulmonary vascular congestion.
Atherosclerosis of thoracic aorta is noted. No pneumothorax is
noted. Mild bilateral pleural effusions are noted. Minimal pulmonary
edema may be present. Bony thorax is unremarkable.
IMPRESSION: Stable cardiomegaly with central pulmonary vascular congestion and
possible minimal pulmonary edema. Mild bilateral pleural effusions
are noted.

Aortic Atherosclerosis ([id number]).
